# Patient Record
Sex: Female | Born: 1955
Health system: Southern US, Community
[De-identification: ages and names within clinical notes are randomized; demographics above are authoritative.]

## PROBLEM LIST (undated history)

## (undated) DIAGNOSIS — T4145XA Adverse effect of unspecified anesthetic, initial encounter: Secondary | ICD-10-CM

## (undated) DIAGNOSIS — C801 Malignant (primary) neoplasm, unspecified: Secondary | ICD-10-CM

## (undated) DIAGNOSIS — R42 Dizziness and giddiness: Secondary | ICD-10-CM

## (undated) DIAGNOSIS — R519 Headache, unspecified: Secondary | ICD-10-CM

## (undated) DIAGNOSIS — R112 Nausea with vomiting, unspecified: Secondary | ICD-10-CM

## (undated) DIAGNOSIS — L309 Dermatitis, unspecified: Secondary | ICD-10-CM

## (undated) DIAGNOSIS — R51 Headache: Secondary | ICD-10-CM

## (undated) DIAGNOSIS — M549 Dorsalgia, unspecified: Secondary | ICD-10-CM

## (undated) DIAGNOSIS — Z9889 Other specified postprocedural states: Secondary | ICD-10-CM

## (undated) DIAGNOSIS — I10 Essential (primary) hypertension: Secondary | ICD-10-CM

## (undated) DIAGNOSIS — Z9109 Other allergy status, other than to drugs and biological substances: Secondary | ICD-10-CM

## (undated) DIAGNOSIS — T8859XA Other complications of anesthesia, initial encounter: Secondary | ICD-10-CM

## (undated) DIAGNOSIS — G8929 Other chronic pain: Secondary | ICD-10-CM

## (undated) DIAGNOSIS — F419 Anxiety disorder, unspecified: Secondary | ICD-10-CM

## (undated) HISTORY — PX: TUBAL LIGATION: SHX77

## (undated) HISTORY — PX: BREAST SURGERY: SHX581

## (undated) HISTORY — DX: Dermatitis, unspecified: L30.9

## (undated) HISTORY — PX: LIPOSUCTION: SHX10

---

## 2009-07-21 ENCOUNTER — Ambulatory Visit: Payer: Self-pay | Admitting: Diagnostic Radiology

## 2009-07-21 ENCOUNTER — Emergency Department (HOSPITAL_BASED_OUTPATIENT_CLINIC_OR_DEPARTMENT_OTHER): Admission: EM | Admit: 2009-07-21 | Discharge: 2009-07-21 | Payer: Self-pay | Admitting: Emergency Medicine

## 2009-10-25 ENCOUNTER — Emergency Department (HOSPITAL_BASED_OUTPATIENT_CLINIC_OR_DEPARTMENT_OTHER): Admission: EM | Admit: 2009-10-25 | Discharge: 2009-10-25 | Payer: Self-pay | Admitting: Emergency Medicine

## 2010-07-15 LAB — RAPID STREP SCREEN (MED CTR MEBANE ONLY): Streptococcus, Group A Screen (Direct): NEGATIVE

## 2010-07-23 LAB — URINE CULTURE: Colony Count: 15000

## 2010-07-23 LAB — URINALYSIS, ROUTINE W REFLEX MICROSCOPIC
Hgb urine dipstick: NEGATIVE
Ketones, ur: NEGATIVE mg/dL
Protein, ur: NEGATIVE mg/dL
Urobilinogen, UA: 0.2 mg/dL (ref 0.0–1.0)

## 2010-07-23 LAB — URINE MICROSCOPIC-ADD ON

## 2011-01-03 ENCOUNTER — Encounter: Payer: Self-pay | Admitting: *Deleted

## 2011-01-03 ENCOUNTER — Emergency Department (HOSPITAL_BASED_OUTPATIENT_CLINIC_OR_DEPARTMENT_OTHER)
Admission: EM | Admit: 2011-01-03 | Discharge: 2011-01-03 | Disposition: A | Discharge status: Home / Self Care | Payer: Self-pay | Attending: Emergency Medicine | Admitting: Emergency Medicine

## 2011-01-03 DIAGNOSIS — I10 Essential (primary) hypertension: Secondary | ICD-10-CM | POA: Insufficient documentation

## 2011-01-03 DIAGNOSIS — H8110 Benign paroxysmal vertigo, unspecified ear: Secondary | ICD-10-CM | POA: Insufficient documentation

## 2011-01-03 DIAGNOSIS — H9209 Otalgia, unspecified ear: Secondary | ICD-10-CM | POA: Insufficient documentation

## 2011-01-03 HISTORY — DX: Essential (primary) hypertension: I10

## 2011-01-03 MED ORDER — LORAZEPAM 1 MG PO TABS
1.0000 mg | ORAL_TABLET | Freq: Once | ORAL | Status: AC
  Administered 2011-01-03: 1 mg via ORAL
  Filled 2011-01-03: qty 1

## 2011-01-03 MED ORDER — MECLIZINE HCL 25 MG PO TABS
25.0000 mg | ORAL_TABLET | Freq: Once | ORAL | Status: AC
  Administered 2011-01-03: 25 mg via ORAL
  Filled 2011-01-03: qty 1

## 2011-01-03 MED ORDER — MECLIZINE HCL 25 MG PO TABS: 25.0000 mg | ORAL_TABLET | Freq: Four times a day (QID) | ORAL | Status: AC

## 2011-01-03 NOTE — ED Notes (Signed)
Pt denies much improvement in symptoms, but states she would like to be discharged and attempt to sleep off the dizziness. Will notify MD

## 2011-01-03 NOTE — ED Notes (Signed)
C/o left ear pain and dizziness

## 2011-01-03 NOTE — ED Notes (Signed)
Dr. Opitz at bedside. 

## 2011-01-03 NOTE — ED Notes (Signed)
Pt reports left ear pain and dizziness since waking up this morning. Hx of same multiple times, however pt states she doesn't normally "feel like the room is spinning around, and this time it is". Pt took a meclizine tablet at home, but thinks it may be out of date.

## 2011-01-03 NOTE — ED Provider Notes (Signed)
History     CSN: [id number] Arrival date & time: 01/03/2011 12:29 AM  Chief Complaint  Patient presents with  . Otalgia  . Dizziness   Patient is a 55 y.o. female presenting with neurologic complaint.  Neurologic Problem The primary symptoms include dizziness. Primary symptoms do not include headaches or fever. The symptoms began 12 to 24 hours ago. The symptoms are unchanged. Focality: unchnaged. The symptoms occurred after standing up.  Dizziness does not occur with weakness.  Additional symptoms include vertigo. Additional symptoms do not include neck stiffness, weakness, pain, loss of balance, photophobia, nystagmus, hyperacusis or hearing loss. Medical issues do not include cerebral vascular accident or drug use. Workup history includes CT scan. Procedure history comments: has seen ENT, PCP and then about a year ago saw Pilar Jarvis a physical therapist who able to perform manuevers that alleviated these symptoms. Marland Kitchen  started after standing up this am, feels just like her previous vertigo symptoms, she has felt somewhat congested last few days and has not been taking her allegra. No speech or gait difficulty but every time she looks opr moves a certain way, she gets severe dizzieness with nausea.   Past Medical History  Diagnosis Date  . Hypertension     History reviewed. No pertinent past surgical history.  History reviewed. No pertinent family history.  History  Substance Use Topics  . Smoking status: Never Smoker   . Smokeless tobacco: Not on file  . Alcohol Use: Yes    OB History    Grav Para Term Preterm Abortions TAB SAB Ect Mult Living                  Review of Systems  Constitutional: Negative for fever and chills.  HENT: Negative for hearing loss, neck pain and neck stiffness.   Eyes: Negative for photophobia and pain.  Respiratory: Negative for shortness of breath.   Cardiovascular: Negative for chest pain.  Gastrointestinal: Negative for abdominal pain.    Genitourinary: Negative for dysuria.  Musculoskeletal: Negative for back pain.  Skin: Negative for rash.  Neurological: Positive for dizziness and vertigo. Negative for syncope, weakness, numbness, headaches and loss of balance.  All other systems reviewed and are negative.    Physical Exam  BP 140/88  Pulse 84  Temp(Src) 98.1 F (36.7 C) (Oral)  Resp 17  Ht 5\' 6"  (1.676 m)  Wt 211 lb (95.709 kg)  BMI 34.06 kg/m2  SpO2 100%  Physical Exam  Constitutional: She is oriented to person, place, and time. She appears well-developed and well-nourished.  HENT:  Head: Normocephalic and atraumatic.  Eyes: Conjunctivae and EOM are normal. Pupils are equal, round, and reactive to light.  Neck: Full passive range of motion without pain. Neck supple. No thyromegaly present.       No meningismus  Cardiovascular: Normal rate, regular rhythm, S1 normal, S2 normal and intact distal pulses.   Pulmonary/Chest: Effort normal and breath sounds normal.  Abdominal: Soft. Bowel sounds are normal. There is no tenderness. There is no CVA tenderness.  Musculoskeletal: Normal range of motion.  Neurological: She is alert and oriented to person, place, and time. She has normal strength and normal reflexes. No cranial nerve deficit or sensory deficit. She displays a negative Romberg sign. GCS eye subscore is 4. GCS verbal subscore is 5. GCS motor subscore is 6.       Normal Gait, no nystagmus  Skin: Skin is warm and dry. No rash noted. No cyanosis. Nails show no  clubbing.  Psychiatric: She has a normal mood and affect. Her speech is normal and behavior is normal.    ED Course  Procedures  MDM Vertigo with h/o similar symptoms in the past, treated by [facility]. Normal neuro exam. No indication for CT or MRI. Treated with ativan and antivert. Recheck at 0221am- exam unchanged, feeling better and wants to go home, requesting a referral to her PT Pilar Jarvis at [facility] who was able to help her in the past.  reliable historian understands strict return precaution and follow up instructions.       Sunnie Nielsen, MD 01/03/11 [phone]

## 2014-05-17 ENCOUNTER — Encounter: Payer: Self-pay | Admitting: Neurology

## 2014-05-17 ENCOUNTER — Ambulatory Visit (INDEPENDENT_AMBULATORY_CARE_PROVIDER_SITE_OTHER): Payer: Medicare HMO | Admitting: Neurology

## 2014-05-17 VITALS — BP 138/80 | HR 76 | Resp 16 | Wt 244.4 lb

## 2014-05-17 DIAGNOSIS — M545 Low back pain, unspecified: Secondary | ICD-10-CM

## 2014-05-17 DIAGNOSIS — M542 Cervicalgia: Secondary | ICD-10-CM | POA: Diagnosis not present

## 2014-05-17 DIAGNOSIS — G43009 Migraine without aura, not intractable, without status migrainosus: Secondary | ICD-10-CM

## 2014-05-17 DIAGNOSIS — G43909 Migraine, unspecified, not intractable, without status migrainosus: Secondary | ICD-10-CM | POA: Insufficient documentation

## 2014-05-17 DIAGNOSIS — M19012 Primary osteoarthritis, left shoulder: Secondary | ICD-10-CM | POA: Diagnosis not present

## 2014-05-17 DIAGNOSIS — G8929 Other chronic pain: Secondary | ICD-10-CM | POA: Insufficient documentation

## 2014-05-17 MED ORDER — TIZANIDINE HCL 4 MG PO TABS: ORAL_TABLET | ORAL | Status: DC

## 2014-05-17 MED ORDER — OXYCODONE-ACETAMINOPHEN 10-325 MG PO TABS: 1.0000 | ORAL_TABLET | Freq: Two times a day (BID) | ORAL | Status: DC | PRN

## 2014-05-17 MED ORDER — IMIPRAMINE HCL 25 MG PO TABS: ORAL_TABLET | ORAL | Status: DC

## 2014-05-17 NOTE — Progress Notes (Signed)
t  [facility]  PATIENT: Kimberly Mann DOB: [birth year]  REFERRING CLINICIAN: [name]  HISTORY FROM: Patient  REASON FOR VISIT: Left shoulder and neck pain; back pain   HISTORICAL  CHIEF COMPLAINT:  Chief Complaint  Patient presents with  . Back Pain  . Shoulder Pain    Sts. lower back pain, left sided neck and left shoulder pain are worse over the last several days, she thinks due to colder weather./fim  . Neck Pain    HISTORY OF PRESENT ILLNESS:  Kimberly Mann is a 59 yo woman with neck pain, shoulder pain and back pain.  She has had neck pain for a while but over the last few months it has been more intense. It is worse on the left side.  She gets pain that radiates into the left arm with some tingling into the fingers at times. Moving around will sometimes increase the symptoms.   The left arm seems a little weak.   There is no fixed numbness.    The worse pain is in the left neck.    In the past, trigger point injections did not really help her pain much and she had a yeast infection the one time.   Flexeril only helped a little bit but tizanidine has helped more.    She is taking at night only.  The shoulder pain is sometimes distinct from the neck pain and increases with ext rotation of the arm.    Injections had not helped in the past.     She also reports  Lower back pain worse on her left.     Pain is better if she shifts around and worse if she sits still for a long time.      Pain does not radiate into her legs.   It is crampy.    Muscle relaxants have helped her pain a little bit.   Tramadol had not helped.       She has chronic headaches that sometimes starts at the neck.   She has nausea when the pain is more intense.  She has photo and phonophobia.    Moving increases the throbbing pain.   Resting in a quiet dark room helps the pain.  Topiramate used to help her but no longer does so she stopped.  Sleep is poor some nights.     REVIEW OF  SYSTEMS:  Constitutional: No fevers, chills, sweats, or change in appetite Eyes: No visual changes, double vision, eye pain Ear, nose and throat: No hearing loss, ear pain, nasal congestion, sore throat Cardiovascular: No chest pain, palpitations Respiratory:  No shortness of breath at rest or with exertion.   No wheezes GastrointestinaI: No nausea, vomiting, diarrhea, abdominal pain, fecal incontinence Genitourinary:  No dysuria, urinary retention or frequency.  No nocturia. Musculoskeletal:  No neck pain, back pain Integumentary: No rash, pruritus, skin lesions Neurological: as above Psychiatric: No depression at this time.  No anxiety Endocrine: No palpitations, diaphoresis, change in appetite, change in weigh or increased thirst Hematologic/Lymphatic:  No anemia, purpura, petechiae. Allergic/Immunologic: No itchy/runny eyes, nasal congestion, recent allergic reactions, rashes  ALLERGIES: Allergies  Allergen Reactions  . Penicillins   . Sulfa Antibiotics     HOME MEDICATIONS: Outpatient Prescriptions Prior to Visit  Medication Sig Dispense Refill  . amLODipine (NORVASC) 5 MG tablet Take 5 mg by mouth daily.      . hydrochlorothiazide 25 MG tablet Take 25 mg by mouth daily.  No facility-administered medications prior to visit.    PAST MEDICAL HISTORY: Past Medical History  Diagnosis Date  . Hypertension     PAST SURGICAL HISTORY: Past Surgical History  Procedure Laterality Date  . Tubal ligation    . Liposuction      FAMILY HISTORY: Family History  Problem Relation Age of Onset  . Hypertension Mother     SOCIAL HISTORY:  History   Social History  . Marital Status: Legally Separated    Spouse Name: N/A    Number of Children: N/A  . Years of Education: N/A   Occupational History  . Not on file.   Social History Main Topics  . Smoking status: Former Smoker    Quit date: 05/17/1974  . Smokeless tobacco: Not on file  . Alcohol Use: 4.2 oz/week     7 Not specified per week     Comment: one glass of wine with dinner/fim  . Drug Use: No  . Sexual Activity: Not on file   Other Topics Concern  . Not on file   Social History Narrative     PHYSICAL EXAM  Filed Vitals:   05/17/14 1545  BP: 138/80  Pulse: 76  Resp: 16  Weight: 244 lb 6.4 oz (110.859 kg)    Body mass index is 39.47 kg/(m^2).   General: The patient is well-developed and well-nourished and in no acute distress  Eyes:  Funduscopic exam shows normal optic discs and retinal vessels.  Neck: The neck is supple, no carotid bruits are noted.  The neck is tender over the lower left paraspinal muscles and trapezius muscles  Musculoskeletal:   The left shoulder is tendeer over the subacromial bursa.   The back is tender over lower lumbar paraspinal muscles.  Respiratory: The respiratory examination is clear.  Cardiovascular: The cardiovascular examination reveals a regular rate and rhythm, no murmurs, gallops or rubs are noted.  Skin: Extremities are without significant edema.  Neurologic Exam  Mental status: The patient is alert and oriented x 3 at the time of the examination. The patient has apparent normal recent and remote memory, with an apparently normal attention span and concentration ability.   Speech is normal.  Cranial nerves: Extraocular movements are full. Pupils are equal, round, and reactive to light and accomodation.  Visual fields are full.  Facial symmetry is present. There is good facial sensation to soft touch bilaterally.Facial strength is normal.  Trapezius and sternocleidomastoid strength is normal. No dysarthria is noted.  The tongue is midline, and the patient has symmetric elevation of the soft palate. No obvious hearing deficits are noted.  Motor:  Muscle bulk and tone are normal. Strength is  5 / 5 in all 4 extremities.   Sensory: Sensory testing is intact to pinprick, soft touch, vibration sensation, and position sense on all 4  extremities.  Coordination: Cerebellar testing reveals good finger-nose-finger and heel-to-shin bilaterally.  Gait and station: Station and gait are normal. Tandem gait is normal. Romberg is negative.   Reflexes: Deep tendon reflexes are symmetric and normal bilaterally. Plantar responses are normal.    DIAGNOSTIC DATA (LABS, IMAGING, TESTING) - I reviewed patient records, labs, notes, testing and imaging myself where available.     ASSESSMENT AND PLAN  Primary osteoarthritis of left shoulder  Neck pain  Midline low back pain without sciatica  Migraine without aura and without status migrainosus, not intractable   In summary, Mrs. Signer is a 59 year old woman with neck pain, back pain, left shoulder pain  and migraine headaches. She has had yeast infections as a complication of trigger point and joint injections in the past and wishes not to have another injection. I will renew her oxycodone. I will also start imipramine 25-50 mg nightly. She is advised to stay active and exercises regularly.  She will return to see me in about 4 months, sooner if she has new or worsening neurologic symptoms.  Richard A. Felecia Shelling, MD, PhD 1/49/7026, 3:78 PM Certified in Neurology, Clinical Neurophysiology, Sleep Medicine, Pain Medicine and Neuroimaging  [facility] [address], [place], [place] [address] [phone]

## 2014-05-23 ENCOUNTER — Other Ambulatory Visit: Payer: Self-pay | Admitting: *Deleted

## 2014-05-23 ENCOUNTER — Telehealth: Payer: Self-pay | Admitting: Neurology

## 2014-05-23 ENCOUNTER — Other Ambulatory Visit: Payer: Self-pay | Admitting: Neurology

## 2014-05-23 DIAGNOSIS — M545 Low back pain, unspecified: Secondary | ICD-10-CM

## 2014-05-23 DIAGNOSIS — M549 Dorsalgia, unspecified: Secondary | ICD-10-CM

## 2014-05-23 MED ORDER — OXYCODONE-ACETAMINOPHEN 10-325 MG PO TABS: 1.0000 | ORAL_TABLET | Freq: Two times a day (BID) | ORAL | Status: DC | PRN

## 2014-05-23 MED ORDER — OXYCODONE-ACETAMINOPHEN 10-325 MG PO TABS: 1.0000 | ORAL_TABLET | Freq: Four times a day (QID) | ORAL | Status: DC | PRN

## 2014-05-23 NOTE — Telephone Encounter (Signed)
Pt wants to know when she can come and pick up the correct Rx for oxyCODONE-acetaminophen (PERCOCET) 10-325 MG per tablet.  The one she got was for 1/19 and it should be for 2/1.  She will bring the wrong one back when she picks up the correct one.  Please call and advise.

## 2014-05-23 NOTE — Telephone Encounter (Signed)
Spoke with Kimberly Mann and advised post-dated rx. Is available to be picked up/fim

## 2014-07-06 ENCOUNTER — Other Ambulatory Visit: Payer: Self-pay | Admitting: *Deleted

## 2014-07-06 DIAGNOSIS — M545 Low back pain, unspecified: Secondary | ICD-10-CM

## 2014-07-06 MED ORDER — OXYCODONE-ACETAMINOPHEN 10-325 MG PO TABS: 1.0000 | ORAL_TABLET | Freq: Four times a day (QID) | ORAL | Status: DC | PRN

## 2014-07-06 NOTE — Telephone Encounter (Signed)
Request entered, forwarded to provider for approval.  

## 2014-07-07 ENCOUNTER — Telehealth: Payer: Self-pay

## 2014-07-07 NOTE — Telephone Encounter (Signed)
Called patient and informed Rx ready for pick up at front desk. Patient verbalized understanding.  

## 2014-08-03 ENCOUNTER — Telehealth: Payer: Self-pay | Admitting: Neurology

## 2014-08-03 DIAGNOSIS — M545 Low back pain, unspecified: Secondary | ICD-10-CM

## 2014-08-03 MED ORDER — OXYCODONE-ACETAMINOPHEN 10-325 MG PO TABS: 1.0000 | ORAL_TABLET | Freq: Four times a day (QID) | ORAL | Status: DC | PRN

## 2014-08-03 NOTE — Telephone Encounter (Signed)
Spoke with Kimberly Mann and advised rx. will be ready after 1pm today.  Rx. printed, signed, up front [facility]/fim

## 2014-08-03 NOTE — Telephone Encounter (Signed)
Patient requesting refill for Rx oxyCODONE-acetaminophen (PERCOCET) 10-325 MG per tablet.  Please call when ready for pick up.

## 2014-08-30 ENCOUNTER — Telehealth: Payer: Self-pay | Admitting: Neurology

## 2014-08-30 DIAGNOSIS — M545 Low back pain, unspecified: Secondary | ICD-10-CM

## 2014-08-30 MED ORDER — OXYCODONE-ACETAMINOPHEN 10-325 MG PO TABS: 1.0000 | ORAL_TABLET | Freq: Four times a day (QID) | ORAL | Status: DC | PRN

## 2014-08-30 NOTE — Telephone Encounter (Signed)
Patient called and needed a refill for Rx. oxyCODONE-acetaminophen (PERCOCET) 10-325 MG per tablet. Would like to know if she can pick it up tomorrow. Please call and advise.

## 2014-08-30 NOTE — Telephone Encounter (Signed)
I have spoken with Kimberly Mann and advised she can pick post-dated rx (due 5-8) up tomorrow.  Rx. printed, signed, up front [facility]/fim

## 2014-09-29 ENCOUNTER — Telehealth: Payer: Self-pay | Admitting: Neurology

## 2014-09-29 DIAGNOSIS — M545 Low back pain, unspecified: Secondary | ICD-10-CM

## 2014-09-29 MED ORDER — OXYCODONE-ACETAMINOPHEN 10-325 MG PO TABS: 1.0000 | ORAL_TABLET | Freq: Four times a day (QID) | ORAL | Status: DC | PRN

## 2014-09-29 NOTE — Telephone Encounter (Signed)
Patient called and requested a refill on Rx. oxyCODONE-acetaminophen (PERCOCET) 10-325 MG per tablet. Informed the patient it would be ready within 24 hours.

## 2014-09-29 NOTE — Telephone Encounter (Signed)
Rx. printed, awaiting signature/fim

## 2014-09-29 NOTE — Telephone Encounter (Signed)
Rx. signed and up front [facility]/fim

## 2014-11-02 ENCOUNTER — Other Ambulatory Visit: Payer: Self-pay | Admitting: Neurology

## 2014-11-02 DIAGNOSIS — M545 Low back pain, unspecified: Secondary | ICD-10-CM

## 2014-11-02 MED ORDER — OXYCODONE-ACETAMINOPHEN 10-325 MG PO TABS: 1.0000 | ORAL_TABLET | Freq: Four times a day (QID) | ORAL | Status: DC | PRN

## 2014-11-02 NOTE — Telephone Encounter (Signed)
Patient is calling to order written Rx oxycodone 10-325 mg.  Thanks!

## 2014-11-02 NOTE — Telephone Encounter (Signed)
Request entered, forwarded to provider for approval.  

## 2014-11-30 ENCOUNTER — Other Ambulatory Visit: Payer: Self-pay | Admitting: Neurology

## 2014-11-30 DIAGNOSIS — M545 Low back pain, unspecified: Secondary | ICD-10-CM

## 2014-11-30 MED ORDER — OXYCODONE-ACETAMINOPHEN 10-325 MG PO TABS: 1.0000 | ORAL_TABLET | Freq: Four times a day (QID) | ORAL | Status: DC | PRN

## 2014-11-30 NOTE — Telephone Encounter (Signed)
Request forwarded to provider for review.

## 2014-11-30 NOTE — Telephone Encounter (Signed)
Pt is requesting refill oxyCODONE-acetaminophen (PERCOCET) 10-325 MG per tablet thinks it may be endocet

## 2014-12-01 ENCOUNTER — Telehealth: Payer: Self-pay | Admitting: *Deleted

## 2014-12-01 NOTE — Telephone Encounter (Signed)
Spoke w/ pt and told her oxycodone-acetaminophen Rx refill ready to pick up in office. Her son called yesterday and scheduled f/u for 12/13/14 at 1:00pm. Rx placed up front. She verbalized understanding.

## 2014-12-13 ENCOUNTER — Ambulatory Visit (INDEPENDENT_AMBULATORY_CARE_PROVIDER_SITE_OTHER): Payer: Medicare HMO | Admitting: Neurology

## 2014-12-13 ENCOUNTER — Encounter: Payer: Self-pay | Admitting: Neurology

## 2014-12-13 VITALS — BP 152/90 | HR 82 | Resp 18 | Ht 66.0 in | Wt 244.6 lb

## 2014-12-13 DIAGNOSIS — M5489 Other dorsalgia: Secondary | ICD-10-CM | POA: Diagnosis not present

## 2014-12-13 DIAGNOSIS — M7550 Bursitis of unspecified shoulder: Secondary | ICD-10-CM | POA: Insufficient documentation

## 2014-12-13 DIAGNOSIS — G43009 Migraine without aura, not intractable, without status migrainosus: Secondary | ICD-10-CM | POA: Diagnosis not present

## 2014-12-13 DIAGNOSIS — M545 Low back pain, unspecified: Secondary | ICD-10-CM

## 2014-12-13 DIAGNOSIS — M7552 Bursitis of left shoulder: Secondary | ICD-10-CM | POA: Diagnosis not present

## 2014-12-13 DIAGNOSIS — M542 Cervicalgia: Secondary | ICD-10-CM

## 2014-12-13 MED ORDER — OXYCODONE-ACETAMINOPHEN 10-325 MG PO TABS: 1.0000 | ORAL_TABLET | Freq: Four times a day (QID) | ORAL | Status: DC | PRN

## 2014-12-13 NOTE — Progress Notes (Signed)
t  [facility]  PATIENT: Kimberly Mann DOB: 12/27/1955  REFERRING CLINICIAN: [name]  HISTORY FROM: Patient  REASON FOR VISIT: Left shoulder and neck pain; back pain   HISTORICAL  CHIEF COMPLAINT:  Chief Complaint  Patient presents with  . Neck Pain    Sts. neck and lbp are about the same.  Sts. left shoulder pain is worse./fim  . Left Shoulder Pain  . Back Pain    HISTORY OF PRESENT ILLNESS:  Kimberly Mann is a 59 yo woman with shoulder pain, neck pain and back pain.  The left shoulder pain is a lot worse.  Pain is worse with elevation and ext rotation of the arm.    Injections helped some in the past.   Right shoulder hurts some but not nearly as much as the left.  She reports L > R neck pain with a little radiation into the left arm with some tingling into the fingers at times. Moving around will sometimes increase the symptoms.   The left arm seems a little weak.   There is no fixed numbness.   In the past, trigger point injections help her some but benefit was short lived.   Tizanidine has helped a little bit.    She also reports  Left > right lower back pain.   There is some buttock pain  Pain does not radiate into her legs.   It is crampy.    Muscle relaxants have helped her pain a little bit.   Oxycodone has helped       She has chronic headaches that are worse when her neck pain acts up.  She has nausea when the pain is more intense.  She has photo and phonophobia.    Moving increases the throbbing pain.   Resting in a quiet dark room helps the pain.    Acupuncture has helped (last did last week).    Imipramine is helping more than topiramate.      Sleep is poor some nights.  She has more trouble staying asleep and feels she is a little better with imipramine.   REVIEW OF SYSTEMS:  Constitutional: No fevers, chills, sweats, or change in appetite Eyes: No visual changes, double vision, eye pain Ear, nose and throat: No hearing loss, ear pain,  nasal congestion, sore throat Cardiovascular: No chest pain, palpitations Respiratory:  No shortness of breath at rest or with exertion.   No wheezes GastrointestinaI: No nausea, vomiting, diarrhea, abdominal pain, fecal incontinence Genitourinary:  No dysuria, urinary retention or frequency.  No nocturia. Musculoskeletal:  see aboveIntegumentary: No rash, pruritus, skin lesions Neurological: as above Psychiatric: No depression at this time.  No anxiety Endocrine: No palpitations, diaphoresis, change in appetite, change in weigh or increased thirst  ALLERGIES: Allergies  Allergen Reactions  . Penicillins   . Sulfa Antibiotics     HOME MEDICATIONS: Outpatient Prescriptions Prior to Visit  Medication Sig Dispense Refill  . amLODipine (NORVASC) 5 MG tablet Take 5 mg by mouth daily.      . hydrochlorothiazide 25 MG tablet Take 25 mg by mouth daily.      Marland Kitchen imipramine (TOFRANIL) 25 MG tablet Take one or two pills at bedtime 60 tablet 11  . oxyCODONE-acetaminophen (PERCOCET) 10-325 MG per tablet Take 1 tablet by mouth every 6 (six) hours as needed for pain. 120 tablet 0  . tiZANidine (ZANAFLEX) 4 MG tablet One pill in am, one pill po mid-day and one or two po at bedtime 120  tablet 11   No facility-administered medications prior to visit.    PAST MEDICAL HISTORY: Past Medical History  Diagnosis Date  . Hypertension     PAST SURGICAL HISTORY: Past Surgical History  Procedure Laterality Date  . Tubal ligation    . Liposuction      FAMILY HISTORY: Family History  Problem Relation Age of Onset  . Hypertension Mother     SOCIAL HISTORY:  Social History   Social History  . Marital Status: Legally Separated    Spouse Name: N/A  . Number of Children: N/A  . Years of Education: N/A   Occupational History  . Not on file.   Social History Main Topics  . Smoking status: Former Smoker    Quit date: 05/17/1974  . Smokeless tobacco: Not on file  . Alcohol Use: 4.2 oz/week     7 Standard drinks or equivalent per week     Comment: one glass of wine with dinner/fim  . Drug Use: No  . Sexual Activity: Not on file   Other Topics Concern  . Not on file   Social History Narrative     PHYSICAL EXAM  Filed Vitals:   12/13/14 1308  BP: 152/90  Pulse: 82  Resp: 18  Height: 5\' 6"  (1.676 m)  Weight: 244 lb 9.6 oz (110.95 kg)    Body mass index is 39.5 kg/(m^2).   General: The patient is well-developed and well-nourished and in no acute distress  Neck:  The neck is mildly tender over the lower left paraspinal muscles and trapezius muscles  Musculoskeletal:   The left shoulder is very  tender over the subacromial bursa.  ROM is reduced in left shoulder. The back is tender over lower lumbar paraspinal muscles.   Neurologic Exam  Mental status: The patient is alert and oriented x 3 at the time of the examination. The patient has apparent normal recent and remote memory, with an apparently normal attention span and concentration ability.   Speech is normal.  Cranial nerves: Extraocular movements are full.   There is good facial sensation to soft touch bilaterally.Facial strength is normal.  Trapezius and sternocleidomastoid strength is normal. No dysarthria is noted.    No obvious hearing deficits are noted.  Motor:  Muscle bulk and tone are normal. Strength is  5 / 5 in all 4 extremities.   Sensory: Sensory testing is intact to soft touch, vibration sensationin all 4 extremities.  Coordination: Cerebellar testing reveals good finger-nose-finger bilaterally.  Gait and station: Station and gait are normal. Tandem gait is normal.   Reflexes: Deep tendon reflexes are symmetric and normal bilaterally.      DIAGNOSTIC DATA (LABS, IMAGING, TESTING) - I reviewed patient records, labs, notes, testing and imaging myself where available.     ASSESSMENT AND PLAN  1.   Left subacromial bursa injection with 40 mg Depo-Medrol in Marc[place] 2.   Refill percocet.      She will return to see me in about 4 months, sooner if she has new or worsening neurologic symptoms.  Reynald Woods A. Felecia Shelling, MD, PhD 05/29/8655, 8:46 PM Certified in Neurology, Clinical Neurophysiology, Sleep Medicine, Pain Medicine and Neuroimaging  [facility] [address], [place], [place] [address] [phone]

## 2015-01-25 ENCOUNTER — Telehealth: Payer: Self-pay | Admitting: Neurology

## 2015-01-25 DIAGNOSIS — M545 Low back pain, unspecified: Secondary | ICD-10-CM

## 2015-01-25 MED ORDER — OXYCODONE-ACETAMINOPHEN 10-325 MG PO TABS: 1.0000 | ORAL_TABLET | Freq: Four times a day (QID) | ORAL | Status: DC | PRN

## 2015-01-25 NOTE — Telephone Encounter (Signed)
Rx. printed, on RAS ledge to be signed/fim

## 2015-01-25 NOTE — Telephone Encounter (Signed)
Pt needs refill on oxyCODONE-acetaminophen (PERCOCET) 10-325 MG per tablet, thank you

## 2015-01-25 NOTE — Telephone Encounter (Signed)
Rx. up front [facility]/fim 

## 2015-02-27 ENCOUNTER — Other Ambulatory Visit: Payer: Self-pay | Admitting: Neurology

## 2015-02-27 ENCOUNTER — Encounter: Payer: Self-pay | Admitting: *Deleted

## 2015-02-27 DIAGNOSIS — M545 Low back pain, unspecified: Secondary | ICD-10-CM

## 2015-02-27 MED ORDER — OXYCODONE-ACETAMINOPHEN 10-325 MG PO TABS: 1.0000 | ORAL_TABLET | Freq: Four times a day (QID) | ORAL | Status: DC | PRN

## 2015-02-27 NOTE — Progress Notes (Signed)
Oxycodone rx. up front [facility]/fim 

## 2015-02-27 NOTE — Telephone Encounter (Signed)
Request entered, forwarded to provider for approval.  

## 2015-02-27 NOTE — Telephone Encounter (Signed)
Son Kimberly Mann called to request refill of oxyCODONE-acetaminophen (PERCOCET) 10-325 MG tablet

## 2015-03-29 ENCOUNTER — Encounter: Payer: Self-pay | Admitting: *Deleted

## 2015-03-29 ENCOUNTER — Other Ambulatory Visit: Payer: Self-pay | Admitting: Neurology

## 2015-03-29 DIAGNOSIS — M545 Low back pain, unspecified: Secondary | ICD-10-CM

## 2015-03-29 MED ORDER — OXYCODONE-ACETAMINOPHEN 10-325 MG PO TABS: 1.0000 | ORAL_TABLET | Freq: Four times a day (QID) | ORAL | Status: DC | PRN

## 2015-03-29 NOTE — Telephone Encounter (Signed)
Patient called to request refill of oxyCODONE-acetaminophen (PERCOCET) 10-325 MG tablet

## 2015-03-29 NOTE — Telephone Encounter (Signed)
Request entered, forwarded to provider for review.  

## 2015-03-29 NOTE — Progress Notes (Signed)
Oxycodone rx. up front [facility]/fim 

## 2015-05-02 ENCOUNTER — Other Ambulatory Visit: Payer: Self-pay | Admitting: Neurology

## 2015-05-02 ENCOUNTER — Encounter: Payer: Self-pay | Admitting: *Deleted

## 2015-05-02 DIAGNOSIS — M545 Low back pain, unspecified: Secondary | ICD-10-CM

## 2015-05-02 MED ORDER — OXYCODONE-ACETAMINOPHEN 10-325 MG PO TABS: 1.0000 | ORAL_TABLET | Freq: Four times a day (QID) | ORAL | Status: DC | PRN

## 2015-05-02 NOTE — Progress Notes (Signed)
Oxycodone rx. up front [facility]/fim 

## 2015-05-02 NOTE — Telephone Encounter (Signed)
Request entered, forwarded to provider for review.  

## 2015-05-02 NOTE — Telephone Encounter (Signed)
Patient is calling to order a written Rx Oxycodone-acetaminophen 10-325 mg.  Thanks!

## 2015-05-17 ENCOUNTER — Encounter: Payer: Self-pay | Admitting: Neurology

## 2015-05-17 ENCOUNTER — Ambulatory Visit (INDEPENDENT_AMBULATORY_CARE_PROVIDER_SITE_OTHER): Payer: Medicare HMO | Admitting: Neurology

## 2015-05-17 VITALS — BP 138/86 | HR 72 | Resp 16 | Ht 66.0 in | Wt 231.6 lb

## 2015-05-17 DIAGNOSIS — M5489 Other dorsalgia: Secondary | ICD-10-CM

## 2015-05-17 DIAGNOSIS — M5432 Sciatica, left side: Secondary | ICD-10-CM

## 2015-05-17 DIAGNOSIS — M7552 Bursitis of left shoulder: Secondary | ICD-10-CM

## 2015-05-17 DIAGNOSIS — M545 Low back pain, unspecified: Secondary | ICD-10-CM

## 2015-05-17 DIAGNOSIS — M542 Cervicalgia: Secondary | ICD-10-CM | POA: Diagnosis not present

## 2015-05-17 DIAGNOSIS — M5431 Sciatica, right side: Secondary | ICD-10-CM

## 2015-05-17 DIAGNOSIS — M7062 Trochanteric bursitis, left hip: Secondary | ICD-10-CM

## 2015-05-17 MED ORDER — DICLOFENAC SODIUM 1 % TD GEL: 2.0000 g | Freq: Four times a day (QID) | TRANSDERMAL | Status: DC

## 2015-05-17 MED ORDER — OXYCODONE-ACETAMINOPHEN 10-325 MG PO TABS: 1.0000 | ORAL_TABLET | Freq: Four times a day (QID) | ORAL | Status: DC | PRN

## 2015-05-17 MED ORDER — HYDROXYZINE PAMOATE 25 MG PO CAPS: 25.0000 mg | ORAL_CAPSULE | Freq: Three times a day (TID) | ORAL | Status: DC | PRN

## 2015-05-17 NOTE — Progress Notes (Signed)
t  [facility]  PATIENT: Kimberly Mann DOB: 19-Mar-1956  REFERRING CLINICIAN: [name]  HISTORY FROM: Patient  REASON FOR VISIT: Left shoulder and neck pain; back pain   HISTORICAL  CHIEF COMPLAINT:  Chief Complaint  Patient presents with  . Back Pain    Sts. lbp has been worse over the last couple of mos.  Occasionally radiates into her left buttock./fim  . Neck Pain    **She requests rx. for Vistaril for itching--sts. she  has itching for about 24 hrs. after tpi's.  She also requests rx. for Voltaren gel/fim    HISTORY OF PRESENT ILLNESS:  Kimberly Mann is a 60 yo woman with lower back/buttock pain, shoulder pain and neck pain.  She reports left > right lower back pain that is most pronounced in the left buttock and hip.  Pain does not radiate into her legs.   Pain is crampy.    Muscle relaxants have helped her pain a little bit.   Oxycodone has helped      The left shoulder pain is acting back up (left worse than right) but is not as bad as it was before the subacromial bursa injection.  ROM is better since the injection.  She reports L > R neck pain that is worse with movements.  No numbness or weakness.Marland Kitchen      REVIEW OF SYSTEMS:  Constitutional: No fevers, chills, sweats, or change in appetite Eyes: No visual changes, double vision, eye pain Ear, nose and throat: No hearing loss, ear pain, nasal congestion, sore throat Cardiovascular: No chest pain, palpitations Respiratory:  No shortness of breath at rest or with exertion.   No wheezes GastrointestinaI: No nausea, vomiting, diarrhea, abdominal pain, fecal incontinence Genitourinary:  No dysuria, urinary retention or frequency.  No nocturia. Musculoskeletal:  see aboveIntegumentary: No rash, pruritus, skin lesions Neurological: as above Psychiatric: No depression at this time.  No anxiety Endocrine: No palpitations, diaphoresis, change in appetite, change in weigh or increased  thirst  ALLERGIES: Allergies  Allergen Reactions  . Penicillins   . Sulfa Antibiotics     HOME MEDICATIONS: Outpatient Prescriptions Prior to Visit  Medication Sig Dispense Refill  . amLODipine (NORVASC) 5 MG tablet Take 5 mg by mouth daily.      . hydrochlorothiazide 25 MG tablet Take 25 mg by mouth daily.      Marland Kitchen imipramine (TOFRANIL) 25 MG tablet Take one or two pills at bedtime 60 tablet 11  . oxyCODONE-acetaminophen (PERCOCET) 10-325 MG tablet Take 1 tablet by mouth every 6 (six) hours as needed for pain. 120 tablet 0  . tiZANidine (ZANAFLEX) 4 MG tablet One pill in am, one pill po mid-day and one or two po at bedtime (Patient not taking: Reported on 05/17/2015) 120 tablet 11  . TYPHIM VI 25 MCG/0.5ML injection Reported on 05/17/2015  0   No facility-administered medications prior to visit.    PAST MEDICAL HISTORY: Past Medical History  Diagnosis Date  . Hypertension     PAST SURGICAL HISTORY: Past Surgical History  Procedure Laterality Date  . Tubal ligation    . Liposuction      FAMILY HISTORY: Family History  Problem Relation Age of Onset  . Hypertension Mother     SOCIAL HISTORY:  Social History   Social History  . Marital Status: Legally Separated    Spouse Name: N/A  . Number of Children: N/A  . Years of Education: N/A   Occupational History  . Not on file.  Social History Main Topics  . Smoking status: Former Smoker    Quit date: 05/17/1974  . Smokeless tobacco: Not on file  . Alcohol Use: 4.2 oz/week    7 Standard drinks or equivalent per week     Comment: one glass of wine with dinner/fim  . Drug Use: No  . Sexual Activity: Not on file   Other Topics Concern  . Not on file   Social History Narrative     PHYSICAL EXAM  Filed Vitals:   05/17/15 1529  BP: 138/86  Pulse: 72  Resp: 16  Height: 5\' 6"  (1.676 m)  Weight: 231 lb 9.6 oz (105.053 kg)    Body mass index is 37.4 kg/(m^2).   General: The patient is well-developed and  well-nourished and in no acute distress  Neck:  The neck is mildly tender over the lower left paraspinal muscles and trapezius muscles  Musculoskeletal:   The left shoulder is mildly tender over the subacromial bursa.  ROM is reduced in left shoulder. The back is tender over the left piriformis muscles.   Tender over left >> right trochanteric bursa.       Neurologic Exam  Mental status: The patient is alert and oriented x 3 at the time of the examination. The patient has apparent normal recent and remote memory, with an apparently normal attention span and concentration ability.    Cranial nerves:    Trapezius and sternocleidomastoid strength is normal. No dysarthria is noted.    No obvious hearing deficits are noted.  Motor:  Muscle bulk and tone are normal. Strength is  5 / 5 in all 4 extremities.   Sensory: Sensory testing is intact to soft touch sensationin all 4 extremities.  Gait and station: Station and gait are normal. Tandem gait is normal.      DIAGNOSTIC DATA (LABS, IMAGING, TESTING) - I reviewed patient records, labs, notes, testing and imaging myself where available.     ASSESSMENT AND PLAN  1.   Left trochanteric bursa injection with 40 mg Depo-Medrol in Marcaine 2.    Bilateral piriformis muscle injections with 4 mg DepoMedrol in Marcaine 3.   Voltaren gel, Refill percocet.     She will return to see me in about 4 months, sooner if she has new or worsening neurologic symptoms.  Arsalan Brisbin A. Felecia Shelling, MD, PhD [id number], [id number] PM Certified in Neurology, Clinical Neurophysiology, Sleep Medicine, Pain Medicine and Neuroimaging  [facility] [address], [place], [place] [address] [phone]

## 2015-06-29 ENCOUNTER — Telehealth: Payer: Self-pay | Admitting: Neurology

## 2015-06-29 DIAGNOSIS — M545 Low back pain, unspecified: Secondary | ICD-10-CM

## 2015-06-29 MED ORDER — OXYCODONE-ACETAMINOPHEN 10-325 MG PO TABS: 1.0000 | ORAL_TABLET | Freq: Four times a day (QID) | ORAL | Status: DC | PRN

## 2015-06-29 NOTE — Telephone Encounter (Signed)
Pt called requesting refill for oxyCODONE-acetaminophen (PERCOCET) 10-325 MG tablet . Pt is going out of town tomorrow, leaving at [place] and inquiring if she could pick RX up today.

## 2015-06-29 NOTE — Telephone Encounter (Signed)
Rx. up front [facility]/fim 

## 2015-07-27 ENCOUNTER — Telehealth: Payer: Self-pay | Admitting: Neurology

## 2015-07-27 DIAGNOSIS — M545 Low back pain, unspecified: Secondary | ICD-10-CM

## 2015-07-27 MED ORDER — OXYCODONE-ACETAMINOPHEN 10-325 MG PO TABS: 1.0000 | ORAL_TABLET | Freq: Four times a day (QID) | ORAL | Status: DC | PRN

## 2015-07-27 NOTE — Telephone Encounter (Signed)
Patient called to request refill of oxyCODONE-acetaminophen (PERCOCET) 10-325 MG tablet

## 2015-07-27 NOTE — Telephone Encounter (Signed)
Rx. awaiting RAS sig/fim 

## 2015-07-27 NOTE — Telephone Encounter (Signed)
Rx. up front [facility]/fim 

## 2015-08-28 ENCOUNTER — Telehealth: Payer: Self-pay | Admitting: Neurology

## 2015-08-28 DIAGNOSIS — M545 Low back pain, unspecified: Secondary | ICD-10-CM

## 2015-08-28 MED ORDER — OXYCODONE-ACETAMINOPHEN 10-325 MG PO TABS: 1.0000 | ORAL_TABLET | Freq: Four times a day (QID) | ORAL | Status: DC | PRN

## 2015-08-28 NOTE — Telephone Encounter (Signed)
Rx. awaiting RAS sig/fim 

## 2015-08-28 NOTE — Telephone Encounter (Signed)
Oxycodone rx. up front [facility]/fim 

## 2015-08-28 NOTE — Telephone Encounter (Signed)
Patient is calling to order a written Rx oxycodone-acetaminophen 10-325 mg tablets.  Thanks!

## 2015-09-13 ENCOUNTER — Ambulatory Visit (INDEPENDENT_AMBULATORY_CARE_PROVIDER_SITE_OTHER): Payer: Medicare HMO | Admitting: Neurology

## 2015-09-13 ENCOUNTER — Encounter: Payer: Self-pay | Admitting: Neurology

## 2015-09-13 VITALS — BP 132/88 | HR 74 | Resp 18 | Ht 66.0 in | Wt 235.0 lb

## 2015-09-13 DIAGNOSIS — M25511 Pain in right shoulder: Secondary | ICD-10-CM | POA: Insufficient documentation

## 2015-09-13 DIAGNOSIS — M7552 Bursitis of left shoulder: Secondary | ICD-10-CM | POA: Diagnosis not present

## 2015-09-13 DIAGNOSIS — G43009 Migraine without aura, not intractable, without status migrainosus: Secondary | ICD-10-CM

## 2015-09-13 DIAGNOSIS — M545 Low back pain, unspecified: Secondary | ICD-10-CM

## 2015-09-13 DIAGNOSIS — M5489 Other dorsalgia: Secondary | ICD-10-CM

## 2015-09-13 DIAGNOSIS — M542 Cervicalgia: Secondary | ICD-10-CM

## 2015-09-13 DIAGNOSIS — G8929 Other chronic pain: Secondary | ICD-10-CM | POA: Insufficient documentation

## 2015-09-13 DIAGNOSIS — M25512 Pain in left shoulder: Secondary | ICD-10-CM

## 2015-09-13 MED ORDER — FLUCONAZOLE 150 MG PO TABS: 150.0000 mg | ORAL_TABLET | Freq: Every day | ORAL | Status: DC

## 2015-09-13 MED ORDER — TIZANIDINE HCL 4 MG PO TABS: ORAL_TABLET | ORAL | Status: DC

## 2015-09-13 MED ORDER — OXYCODONE-ACETAMINOPHEN 10-325 MG PO TABS: 1.0000 | ORAL_TABLET | Freq: Four times a day (QID) | ORAL | Status: DC | PRN

## 2015-09-13 NOTE — Progress Notes (Signed)
t  [facility]  PATIENT: Kimberly Mann DOB: 12/31/1955  REFERRING CLINICIAN: [name]  HISTORY FROM: Patient  REASON FOR VISIT: Left shoulder and neck pain; back pain   HISTORICAL  CHIEF COMPLAINT:  Chief Complaint  Patient presents with  . Neck Pain    Sts. pain is worse in both shoulders and requests inj. today if available/fim  . Back Pain  . Left Hip Pain    HISTORY OF PRESENT ILLNESS:  Kimberly Mann is a 60 yo woman with left > right shoulder pain, neck pain and lower back/buttock pain,   She reports bilateral shoulder pain (left worse than right) and right > left upper arm pain.   In the past, she had a benefit form subacromial bursa injection.  ROM is mildly reduced in shoulders.    No arm weakness.  She reports Left neck pain that is worse with movements and better with rest.  Pain is crampy.  No numbness or weakness..    She has had some left > right lower back pain but its not as bad as the shoulder and neck.    When present, pain is in the left buttock and hip.  Pain does not radiate into her legs.     Med's:  Muscle relaxants have helped her pain some.   She tolerates tizanidine well. Spasms worse at night.  Oxycodone has helped      Migraine headaches are stable with just a couple/month.   Percocet helps to knock them out.       REVIEW OF SYSTEMS:  Constitutional: No fevers, chills, sweats, or change in appetite Eyes: No visual changes, double vision, eye pain Ear, nose and throat: No hearing loss, ear pain, nasal congestion, sore throat Cardiovascular: No chest pain, palpitations Respiratory:  No shortness of breath at rest or with exertion.   No wheezes GastrointestinaI: No nausea, vomiting, diarrhea, abdominal pain, fecal incontinence Genitourinary:  No dysuria, urinary retention or frequency.  No nocturia. Musculoskeletal:  see above Integumentary: No rash, pruritus, skin lesions Neurological: as above Psychiatric: No  depression at this time.  No anxiety Endocrine: No palpitations, diaphoresis, change in appetite, change in weigh or increased thirst  ALLERGIES: Allergies  Allergen Reactions  . Penicillins   . Sulfa Antibiotics     HOME MEDICATIONS: Outpatient Prescriptions Prior to Visit  Medication Sig Dispense Refill  . amLODipine (NORVASC) 5 MG tablet Take 5 mg by mouth daily.      . diclofenac sodium (VOLTAREN) 1 % GEL Apply 2 g topically 4 (four) times daily. 100 g 2  . hydrochlorothiazide 25 MG tablet Take 25 mg by mouth daily.      . hydrOXYzine (VISTARIL) 25 MG capsule Take 1 capsule (25 mg total) by mouth 3 (three) times daily as needed. 60 capsule 0  . imipramine (TOFRANIL) 25 MG tablet Take one or two pills at bedtime 60 tablet 11  . loratadine (CLARITIN) 10 MG tablet     . Multiple Vitamins-Minerals (MULTIVITAMIN ADULT PO)     . oxyCODONE-acetaminophen (PERCOCET) 10-325 MG tablet Take 1 tablet by mouth every 6 (six) hours as needed for pain. 120 tablet 0  . promethazine (PHENERGAN) 25 MG tablet     . tiZANidine (ZANAFLEX) 4 MG tablet One pill in am, one pill po mid-day and one or two po at bedtime 120 tablet 11  . TYPHIM VI 25 MCG/0.5ML injection Reported on 05/17/2015  0   No facility-administered medications prior to visit.  PAST MEDICAL HISTORY: Past Medical History  Diagnosis Date  . Hypertension     PAST SURGICAL HISTORY: Past Surgical History  Procedure Laterality Date  . Tubal ligation    . Liposuction      FAMILY HISTORY: Family History  Problem Relation Age of Onset  . Hypertension Mother     SOCIAL HISTORY:  Social History   Social History  . Marital Status: Legally Separated    Spouse Name: N/A  . Number of Children: N/A  . Years of Education: N/A   Occupational History  . Not on file.   Social History Main Topics  . Smoking status: Former Smoker    Quit date: 05/17/1974  . Smokeless tobacco: Not on file  . Alcohol Use: 4.2 oz/week    7  Standard drinks or equivalent per week     Comment: one glass of wine with dinner/fim  . Drug Use: No  . Sexual Activity: Not on file   Other Topics Concern  . Not on file   Social History Narrative     PHYSICAL EXAM  Filed Vitals:   09/13/15 1405  BP: 132/88  Pulse: 74  Resp: 18  Height: 5\' 6"  (1.676 m)  Weight: 235 lb (106.595 kg)    Body mass index is 37.95 kg/(m^2).   General: The patient is well-developed and well-nourished and in no acute distress  Neck:  The neck is mildly tender over the lower left paraspinal muscles and trapezius muscles  Musculoskeletal:   The shoulder are moderately tender over the subacromial bursae.  ROM is reduced in left shoulder. Neck tender over lower left paraspinal muscles and trapezius.   The back is tender over lower L>R paraspinal muscles       Neurologic Exam  Mental status: The patient is alert and oriented x 3 at the time of the examination. The patient has apparent normal recent and remote memory, with an apparently normal attention span and concentration ability.    Cranial nerves:    Trapezius and sternocleidomastoid strength is normal. No dysarthria is noted.    No obvious hearing deficits are noted.  Motor:  Muscle bulk and tone are normal. Strength is  5 / 5 in all 4 extremities.   Sensory: Sensory testing is intact to soft touch sensationin all 4 extremities.  Gait and station: Station and gait are normal. Tandem gait is normal.      DIAGNOSTIC DATA (LABS, IMAGING, TESTING) - I reviewed patient records, labs, notes, testing and imaging myself where available.     ASSESSMENT AND PLAN   Back pain, lumbosacral - Plan: oxyCODONE-acetaminophen (PERCOCET) 10-325 MG tablet  Subacromial bursitis, left  Bilateral shoulder pain  Neck pain  Midline low back pain without sciatica  Migraine without aura and without status migrainosus, not intractable   1.   Bilateral subacromial bursa  injection with 40 mg  Depo-Medrol in Marcaine using sterile technique. 2.    Left C6C7 paraspinal, left trapezius and Bilateral L4L5 paraspinal muscle trigger point injections with 40 mg DepoMedrol in Marcaine using sterile technique 3.  Refill percocet.   Diflucan for 5 days (often gets thrush after TPI)  She will return to see me in about 4 months, sooner if she has new or worsening neurologic symptoms.  Gardy Montanari A. Felecia Shelling, MD, PhD [id number], [id number] PM Certified in Neurology, Clinical Neurophysiology, Sleep Medicine, Pain Medicine and Neuroimaging  [facility] [address] [place], [place] [address] [phone]

## 2015-10-07 ENCOUNTER — Emergency Department (HOSPITAL_BASED_OUTPATIENT_CLINIC_OR_DEPARTMENT_OTHER): Payer: Medicare HMO

## 2015-10-07 ENCOUNTER — Emergency Department (HOSPITAL_BASED_OUTPATIENT_CLINIC_OR_DEPARTMENT_OTHER)
Admission: EM | Admit: 2015-10-07 | Discharge: 2015-10-07 | Disposition: A | Discharge status: Home / Self Care | Payer: Medicare HMO | Attending: Emergency Medicine | Admitting: Emergency Medicine

## 2015-10-07 ENCOUNTER — Encounter (HOSPITAL_BASED_OUTPATIENT_CLINIC_OR_DEPARTMENT_OTHER): Payer: Self-pay | Admitting: Emergency Medicine

## 2015-10-07 DIAGNOSIS — F0781 Postconcussional syndrome: Secondary | ICD-10-CM

## 2015-10-07 DIAGNOSIS — Z87891 Personal history of nicotine dependence: Secondary | ICD-10-CM | POA: Insufficient documentation

## 2015-10-07 DIAGNOSIS — I10 Essential (primary) hypertension: Secondary | ICD-10-CM | POA: Diagnosis not present

## 2015-10-07 DIAGNOSIS — R110 Nausea: Secondary | ICD-10-CM | POA: Diagnosis not present

## 2015-10-07 DIAGNOSIS — R0981 Nasal congestion: Secondary | ICD-10-CM | POA: Diagnosis not present

## 2015-10-07 DIAGNOSIS — R51 Headache: Secondary | ICD-10-CM

## 2015-10-07 DIAGNOSIS — G44309 Post-traumatic headache, unspecified, not intractable: Secondary | ICD-10-CM | POA: Insufficient documentation

## 2015-10-07 DIAGNOSIS — Y9344 Activity, trampolining: Secondary | ICD-10-CM | POA: Insufficient documentation

## 2015-10-07 DIAGNOSIS — R42 Dizziness and giddiness: Secondary | ICD-10-CM

## 2015-10-07 DIAGNOSIS — Z79899 Other long term (current) drug therapy: Secondary | ICD-10-CM | POA: Diagnosis not present

## 2015-10-07 DIAGNOSIS — Y999 Unspecified external cause status: Secondary | ICD-10-CM | POA: Insufficient documentation

## 2015-10-07 DIAGNOSIS — Y929 Unspecified place or not applicable: Secondary | ICD-10-CM | POA: Diagnosis not present

## 2015-10-07 DIAGNOSIS — H9202 Otalgia, left ear: Secondary | ICD-10-CM

## 2015-10-07 DIAGNOSIS — R519 Headache, unspecified: Secondary | ICD-10-CM

## 2015-10-07 DIAGNOSIS — W1789XA Other fall from one level to another, initial encounter: Secondary | ICD-10-CM | POA: Diagnosis not present

## 2015-10-07 HISTORY — DX: Dizziness and giddiness: R42

## 2015-10-07 LAB — CBC WITH DIFFERENTIAL/PLATELET
Basophils Absolute: 0 10*3/uL (ref 0.0–0.1)
Basophils Relative: 1 %
Eosinophils Absolute: 0.2 10*3/uL (ref 0.0–0.7)
Eosinophils Relative: 4 %
HCT: 38.7 % (ref 36.0–46.0)
Hemoglobin: 12.5 g/dL (ref 12.0–15.0)
Lymphocytes Relative: 43 %
Lymphs Abs: 2.5 10*3/uL (ref 0.7–4.0)
MCH: 27.5 pg (ref 26.0–34.0)
MCHC: 32.3 g/dL (ref 30.0–36.0)
MCV: 85.2 fL (ref 78.0–100.0)
Monocytes Absolute: 0.4 10*3/uL (ref 0.1–1.0)
Monocytes Relative: 6 %
Neutro Abs: 2.8 10*3/uL (ref 1.7–7.7)
Neutrophils Relative %: 46 %
Platelets: 290 10*3/uL (ref 150–400)
RBC: 4.54 MIL/uL (ref 3.87–5.11)
RDW: 15.7 % — ABNORMAL HIGH (ref 11.5–15.5)
WBC: 6 10*3/uL (ref 4.0–10.5)

## 2015-10-07 LAB — BASIC METABOLIC PANEL
Anion gap: 9 (ref 5–15)
BUN: 18 mg/dL (ref 6–20)
CO2: 26 mmol/L (ref 22–32)
Calcium: 9.5 mg/dL (ref 8.9–10.3)
Chloride: 105 mmol/L (ref 101–111)
Creatinine, Ser: 0.94 mg/dL (ref 0.44–1.00)
GFR calc Af Amer: >60 mL/min (ref >60)
GFR calc non Af Amer: >60 mL/min (ref >60)
Glucose, Bld: 91 mg/dL (ref 65–99)
Potassium: 3.4 mmol/L — ABNORMAL LOW (ref 3.5–5.1)
Sodium: 140 mmol/L (ref 135–145)

## 2015-10-07 MED ORDER — CETIRIZINE-PSEUDOEPHEDRINE ER 5-120 MG PO TB12: 1.0000 | ORAL_TABLET | Freq: Two times a day (BID) | ORAL | Status: DC

## 2015-10-07 MED ORDER — IBUPROFEN 800 MG PO TABS: 800.0000 mg | ORAL_TABLET | Freq: Three times a day (TID) | ORAL | Status: DC

## 2015-10-07 MED ORDER — MECLIZINE HCL 25 MG PO TABS
12.5000 mg | ORAL_TABLET | Freq: Once | ORAL | Status: DC
  Filled 2015-10-07: qty 1

## 2015-10-07 MED ORDER — LORAZEPAM 0.5 MG PO TABS: 1.0000 mg | ORAL_TABLET | Freq: Three times a day (TID) | ORAL | Status: DC | PRN

## 2015-10-07 NOTE — ED Provider Notes (Signed)
CSN: [id number]     Arrival date & time 10/07/15  1529 History   First MD Initiated Contact with Patient 10/07/15 1602     Chief Complaint  Patient presents with  . Head Injury     (Consider location/radiation/quality/duration/timing/severity/associated sxs/prior Treatment) HPI Comments: Patient is a 60 year old female with history of BPPV who presents with intermittent headaches, nausea since a fall on May 23. Patient reports she was bouncing on a mini trampoline for exercise and fell off and hit the back of her head on a stone fireplace. Patient immediately got up and felt a little out of it. Patient has since had intermittent headaches and nausea mostly in the morning. Patient reports she watches a lot of TV sometimes until 5 or 6 in the morning. Patient also began last week with left ear pain, productive cough with clear sputum and nasal congestion. Patient has had associated dizziness with head movement, especially looking up. Patient states this is the normal start of her vertigo episodes and it always starts in her left ear. She states that pollen is a trigger for her nasal congestion and she usually feels this way when she goes outside. Patient also reports associated bilateral, tender cervical adenopathy. Patient took 2 pills of the Z-Pak last week without relief. Patient reports she usually gets an antibiotic and steroid injection at her doctor's office. Patient is lightheaded now. Patient denies any chest pain, shortness of breath, abdominal pain.  Patient is a 60 y.o. female presenting with head injury. The history is provided by the patient.  Head Injury Associated symptoms: headache and nausea   Associated symptoms: no vomiting     Past Medical History  Diagnosis Date  . Hypertension   . Vertigo    Past Surgical History  Procedure Laterality Date  . Tubal ligation    . Liposuction     Family History  Problem Relation Age of Onset  . Hypertension Mother    Social History   Substance Use Topics  . Smoking status: Former Smoker    Quit date: 05/17/1974  . Smokeless tobacco: None  . Alcohol Use: 4.2 oz/week    7 Standard drinks or equivalent per week     Comment: one glass of wine with dinner/fim   OB History    No data available     Review of Systems  Constitutional: Negative for fever and chills.  HENT: Positive for congestion and ear pain (L). Negative for facial swelling and sore throat.   Respiratory: Positive for cough. Negative for shortness of breath.   Cardiovascular: Negative for chest pain.  Gastrointestinal: Positive for nausea. Negative for vomiting and abdominal pain.  Genitourinary: Negative for dysuria.  Musculoskeletal: Negative for back pain.  Skin: Negative for rash and wound.  Neurological: Positive for dizziness, light-headedness and headaches.  Psychiatric/Behavioral: The patient is not nervous/anxious.       Allergies  Penicillins and Sulfa antibiotics  Home Medications   Prior to Admission medications   Medication Sig Start Date End Date Taking? Authorizing Provider  amLODipine (NORVASC) 5 MG tablet Take 5 mg by mouth daily.      Historical Provider, MD  cetirizine-pseudoephedrine (ZYRTEC-D) 5-120 MG tablet Take 1 tablet by mouth 2 (two) times daily. 10/07/15   Frederica Kuster, PA-C  diclofenac sodium (VOLTAREN) 1 % GEL Apply 2 g topically 4 (four) times daily. 05/17/15   Britt Bottom, MD  fluconazole (DIFLUCAN) 150 MG tablet Take 1 tablet (150 mg total) by mouth daily. 09/13/15  Britt Bottom, MD  hydrochlorothiazide 25 MG tablet Take 25 mg by mouth daily.      Historical Provider, MD  hydrOXYzine (VISTARIL) 25 MG capsule Take 1 capsule (25 mg total) by mouth 3 (three) times daily as needed. 05/17/15   Britt Bottom, MD  ibuprofen (ADVIL,MOTRIN) 800 MG tablet Take 1 tablet (800 mg total) by mouth 3 (three) times daily. 10/07/15   Frederica Kuster, PA-C  imipramine (TOFRANIL) 25 MG tablet Take one or two pills at  bedtime 05/17/14   Britt Bottom, MD  loratadine (CLARITIN) 10 MG tablet  03/05/15   Historical Provider, MD  LORazepam (ATIVAN) 0.5 MG tablet Take 2 tablets (1 mg total) by mouth 3 (three) times daily as needed (for dizziness). 10/07/15   Frederica Kuster, PA-C  Multiple Vitamins-Minerals (MULTIVITAMIN ADULT PO)  03/29/15   Historical Provider, MD  oxyCODONE-acetaminophen (PERCOCET) 10-325 MG tablet Take 1 tablet by mouth every 6 (six) hours as needed for pain. 09/13/15   Britt Bottom, MD  promethazine (PHENERGAN) 25 MG tablet  03/05/15   Historical Provider, MD  tiZANidine (ZANAFLEX) 4 MG tablet One pill in am, one pill po mid-day and one or two po at bedtime 09/13/15   Britt Bottom, MD  TYPHIM VI 25 MCG/0.5ML injection Reported on 05/17/2015 09/22/14   Historical Provider, MD   BP 148/94 mmHg  Pulse 74  Temp(Src) 98.2 F (36.8 C) (Oral)  Resp 20  Ht 5\' 5"  (1.651 m)  Wt 106.142 kg  BMI 38.94 kg/m2  SpO2 100% Physical Exam  Constitutional: She appears well-developed and well-nourished. No distress.  HENT:  Head: Normocephalic and atraumatic.  Right Ear: Tympanic membrane normal.  Left Ear: Tympanic membrane normal.  Mouth/Throat: Oropharynx is clear and moist. No oropharyngeal exudate.  Eyes: Conjunctivae and EOM are normal. Pupils are equal, round, and reactive to light. Right eye exhibits no discharge. Left eye exhibits no discharge. No scleral icterus.  Neck: Normal range of motion. Neck supple. No thyromegaly present.  Cardiovascular: Normal rate, regular rhythm, normal heart sounds and intact distal pulses.  Exam reveals no gallop and no friction rub.   No murmur heard. Pulmonary/Chest: Effort normal and breath sounds normal. No stridor. No respiratory distress. She has no wheezes. She has no rales.  Abdominal: Soft. Bowel sounds are normal. She exhibits no distension. There is no tenderness. There is no rebound and no guarding.  Musculoskeletal: She exhibits no edema.   Lymphadenopathy:    She has no cervical adenopathy.  Neurological: She is alert. Coordination normal.  CN 3-12 intact; normal sensation throughout; 5/5 strength in all 4 extremities; equal bilateral grip strength   Skin: Skin is warm and dry. No rash noted. She is not diaphoretic. No pallor.  Psychiatric: She has a normal mood and affect.  Nursing note and vitals reviewed.   ED Course  Procedures (including critical care time) Labs Review Labs Reviewed  CBC WITH DIFFERENTIAL/PLATELET - Abnormal; Notable for the following:    RDW 15.7 (*)    All other components within normal limits  BASIC METABOLIC PANEL - Abnormal; Notable for the following:    Potassium 3.4 (*)    All other components within normal limits    Imaging Review Dg Chest 2 View  10/07/2015  CLINICAL DATA:  [place] off trampoline 09/19/2015, intermittent headache, productive cough EXAM: CHEST  2 VIEW COMPARISON:  12/13/2008 FINDINGS: Cardiomediastinal silhouette is stable. No acute infiltrate or pleural effusion. No pulmonary edema. Mild degenerative  changes lower thoracic spine. IMPRESSION: No active cardiopulmonary disease. Electronically Signed   By: Lahoma Crocker M.D.   On: 10/07/2015 17:26   I have personally reviewed and evaluated these images and lab results as part of my medical decision-making.   EKG Interpretation None      MDM   Normal neuro exam, no focal deficits. CBC, BMP unremarkable. CXR shows no active cardiopulmonary disease. Due to patient's delayed presentation, CT scan would not be beneficial at this time. I suspect postconcussion syndrome as cause of intermittent posterior headaches and a.m. nausea. I advised patient to reduce screen time to 15-20 minutes per day. Patient also presenting with symptoms consistent with her history of BPPV. Patient stated that Antivert did not help her and patient was driving and I could not give her Ativan here. Patient discharged with small amount of Ativan and  ibuprofen for ear pain. Patient to follow up with PCP and physical therapist for treatment for her BPPV. Patient advised to follow up with PCP if symptoms persist for outpatient MRI. Strict return precautions given. Patient also evaluated by Dr. Ralene Bathe who is in agreement with plan. Patient vitals stable throughout ED course and discharged in satisfactory condition. Patient understands and is in agreement with plan.  Final diagnoses:  Dizziness  Nonintractable headache, unspecified chronicity pattern, unspecified headache type  Post concussion syndrome  Nausea  Nasal congestion  Left ear pain       Frederica Kuster, PA-C 10/08/15 0108  Quintella Reichert, MD 10/10/15 [phone]

## 2015-10-07 NOTE — Discharge Instructions (Signed)
Medications: Zyrtec-D, Ativan, ibuprofen  Treatment: Take Zyrtec-D as prescribed for your nasal congestion and ear pain. Take Ativan as needed 3 times a day for your dizziness. Take ibuprofen 3 times daily as needed for your pain. Reduce your screen time to 15-20 minutes a day, which includes television, computer, phone, tablet.  Follow-up: Please follow-up with your primary care provider next week for further evaluation and treatment of your symptoms. Please return to emergency department if you develop any new or worsening symptoms.   Post-Concussion Syndrome Post-concussion syndrome describes the symptoms that can occur after a head injury. These symptoms can last from weeks to months. CAUSES  It is not clear why some head injuries cause post-concussion syndrome. It can occur whether your head injury was mild or severe and whether you were wearing head protection or not.  SIGNS AND SYMPTOMS  Memory difficulties.  Dizziness.  Headaches.  Double vision or blurry vision.  Sensitivity to light.  Hearing difficulties.  Depression.  Tiredness.  Weakness.  Difficulty with concentration.  Difficulty sleeping or staying asleep.  Vomiting.  Poor balance or instability on your feet.  Slow reaction time.  Difficulty learning and remembering things you have heard. DIAGNOSIS  There is no test to determine whether you have post-concussion syndrome. Your health care provider may order an imaging scan of your brain, such as a CT scan, to check for other problems that may be causing your symptoms (such as a severe injury inside your skull). TREATMENT  Usually, these problems disappear over time without medical care. Your health care provider may prescribe medicine to help ease your symptoms. It is important to follow up with a neurologist to evaluate your recovery and address any lingering symptoms or issues. HOME CARE INSTRUCTIONS   Take medicines only as directed by your health  care provider. Do not take aspirin. Aspirin can slow blood clotting.  Sleep with your head slightly elevated to help with headaches.  Avoid any situation where there is potential for another head injury. This includes football, hockey, soccer, basketball, martial arts, downhill snow sports, and horseback riding. Your condition will get worse every time you experience a concussion. You should avoid these activities until you are evaluated by the appropriate follow-up health care providers.  Keep all follow-up visits as directed by your health care provider. This is important. SEEK MEDICAL CARE IF:  You have increased problems paying attention or concentrating.  You have increased difficulty remembering or learning new information.  You need more time to complete tasks or assignments than before.  You have increased irritability or decreased ability to cope with stress.  You have more symptoms than before. Seek medical care if you have any of the following symptoms for more than two weeks after your injury:  Lasting (chronic) headaches.  Dizziness or balance problems.  Nausea.  Vision problems.  Increased sensitivity to noise or light.  Depression or mood swings.  Anxiety or irritability.  Memory problems.  Difficulty concentrating or paying attention.  Sleep problems.  Feeling tired all the time. SEEK IMMEDIATE MEDICAL CARE IF:  You have confusion or unusual drowsiness.  Others find it difficult to wake you up.  You have nausea or persistent, forceful vomiting.  You feel like you are moving when you are not (vertigo). Your eyes may move rapidly back and forth.  You have convulsions or faint.  You have severe, persistent headaches that are not relieved by medicine.  You cannot use your arms or legs normally.  One  of your pupils is larger than the other.  You have clear or bloody discharge from your nose or ears.  Your problems are getting worse, not  better. MAKE SURE YOU:  Understand these instructions.  Will watch your condition.  Will get help right away if you are not doing well or get worse.   This information is not intended to replace advice given to you by your health care provider. Make sure you discuss any questions you have with your health care provider.   Document Released: 10/05/2001 Document Revised: 05/06/2014 Document Reviewed: 07/21/2013 Elsevier Interactive Patient Education [employer].

## 2015-10-07 NOTE — ED Notes (Signed)
[employer] tr[employer] in May and hit head on fireplace. Has had continued headache. +dizziness and intermittent nausea.

## 2015-10-07 NOTE — ED Notes (Signed)
Pt given d/c instructions as per chart. Verbalizes understanding. No questions. Rx x 3 with advisory on Ativan.

## 2015-10-07 NOTE — ED Notes (Signed)
Patient states she fell off her mini trampoline on 09/19/15 and hit her head on the posterior scalp.  No loc.  States she has had intermittent headaches and nausea since.  States she also has had a productive cough with clear secretions, hoarseness of her throat and left ear ache.  States she has vertigo and feels somewhat dizzy.

## 2015-10-24 ENCOUNTER — Telehealth: Payer: Self-pay | Admitting: Neurology

## 2015-10-24 DIAGNOSIS — M545 Low back pain, unspecified: Secondary | ICD-10-CM

## 2015-10-24 MED ORDER — OXYCODONE-ACETAMINOPHEN 10-325 MG PO TABS: 1.0000 | ORAL_TABLET | Freq: Four times a day (QID) | ORAL | Status: DC | PRN

## 2015-10-24 NOTE — Telephone Encounter (Signed)
Patient called to request refill of oxyCODONE-acetaminophen (PERCOCET) 10-325 MG tablet

## 2015-10-24 NOTE — Telephone Encounter (Signed)
Rx. awaiting RAS sig/fim 

## 2015-10-24 NOTE — Telephone Encounter (Signed)
Oxycodone rx. up front [facility]/fim 

## 2015-11-22 ENCOUNTER — Telehealth: Payer: Self-pay | Admitting: Neurology

## 2015-11-22 DIAGNOSIS — M545 Low back pain, unspecified: Secondary | ICD-10-CM

## 2015-11-22 MED ORDER — OXYCODONE-ACETAMINOPHEN 10-325 MG PO TABS: 1.0000 | ORAL_TABLET | Freq: Four times a day (QID) | ORAL | 0 refills | Status: DC | PRN

## 2015-11-22 NOTE — Addendum Note (Signed)
Addended by: France Ravens I on: 11/22/2015 01:47 PM   Modules accepted: Orders

## 2015-11-22 NOTE — Telephone Encounter (Signed)
Rx. awaiting RAS sig/fim 

## 2015-11-22 NOTE — Telephone Encounter (Signed)
Rx. up front [facility]/fim 

## 2015-11-22 NOTE — Telephone Encounter (Signed)
Patient is calling to get a written Rx for oxyCODONE-acetaminophen (PERCOCET) 10-325 MG tablet.  I advised the Rx will be ready in 24 hours unless the nurse advises otherwise.

## 2015-12-27 ENCOUNTER — Telehealth: Payer: Self-pay | Admitting: Neurology

## 2015-12-27 ENCOUNTER — Other Ambulatory Visit: Payer: Self-pay | Admitting: *Deleted

## 2015-12-27 DIAGNOSIS — M545 Low back pain, unspecified: Secondary | ICD-10-CM

## 2015-12-27 DIAGNOSIS — G894 Chronic pain syndrome: Secondary | ICD-10-CM

## 2015-12-27 MED ORDER — OXYCODONE-ACETAMINOPHEN 10-325 MG PO TABS: 1.0000 | ORAL_TABLET | Freq: Four times a day (QID) | ORAL | 0 refills | Status: DC | PRN

## 2015-12-27 NOTE — Telephone Encounter (Signed)
Oxycodone rx. up front [facility]/fim 

## 2015-12-27 NOTE — Telephone Encounter (Signed)
Patient called to request refill of oxyCODONE-acetaminophen (PERCOCET) 10-325 MG tablet

## 2015-12-27 NOTE — Telephone Encounter (Signed)
Rx. awaiting RAS sig/fim 

## 2015-12-29 ENCOUNTER — Other Ambulatory Visit (INDEPENDENT_AMBULATORY_CARE_PROVIDER_SITE_OTHER): Payer: Self-pay

## 2015-12-29 DIAGNOSIS — G894 Chronic pain syndrome: Secondary | ICD-10-CM

## 2015-12-29 DIAGNOSIS — Z0289 Encounter for other administrative examinations: Secondary | ICD-10-CM

## 2016-01-05 LAB — BENZODIAZEPINES CONFIRM, URINE
ALPRAZOLAM: NEGATIVE
BENZODIAZEPINES: NEGATIVE ng/mL
CLONAZEPAM: NEGATIVE
FLURAZEPAM UR: NEGATIVE
LORAZEPAM: NEGATIVE
MIDAZOLAM: NEGATIVE
NORDIAZEPAM: NEGATIVE
OXAZEPAM UR: NEGATIVE
TEMAZEPAM: NEGATIVE
TRIAZOLAM: NEGATIVE

## 2016-01-05 LAB — OXYCODONE/OXYMORPHONE CONFIRM
OXYCODONE/OXYMORPH: POSITIVE — AB
OXYCODONE: >3000 ng/mL
OXYCODONE: POSITIVE — AB
OXYMORPHONE CONFIRM: 487 ng/mL
OXYMORPHONE: POSITIVE — AB

## 2016-01-05 LAB — MONITOR DRUG PROFILE 10(MW)
AMPHETAMINE SCREEN URINE: NEGATIVE ng/mL
BARBITURATE SCREEN URINE: NEGATIVE ng/mL
CANNABINOIDS UR QL SCN: NEGATIVE ng/mL
COCAINE(METAB.)SCREEN, URINE: NEGATIVE ng/mL
Creatinine(Crt), U: 181.4 mg/dL (ref 20.0–300.0)
Methadone Screen, Urine: NEGATIVE ng/mL
PH UR, DRUG SCRN: 5.6 (ref 4.5–8.9)
Phencyclidine Qn, Ur: NEGATIVE ng/mL
Propoxyphene Scrn, Ur: NEGATIVE ng/mL

## 2016-01-05 LAB — OPIATES CONFIRMATION, URINE: Opiates: NEGATIVE ng/mL

## 2016-01-17 ENCOUNTER — Ambulatory Visit (INDEPENDENT_AMBULATORY_CARE_PROVIDER_SITE_OTHER): Payer: Medicare HMO | Admitting: Neurology

## 2016-01-17 ENCOUNTER — Encounter: Payer: Self-pay | Admitting: Neurology

## 2016-01-17 VITALS — BP 134/84 | Resp 20 | Ht 65.0 in | Wt 220.0 lb

## 2016-01-17 DIAGNOSIS — M542 Cervicalgia: Secondary | ICD-10-CM | POA: Diagnosis not present

## 2016-01-17 DIAGNOSIS — M7062 Trochanteric bursitis, left hip: Secondary | ICD-10-CM | POA: Diagnosis not present

## 2016-01-17 DIAGNOSIS — M7550 Bursitis of unspecified shoulder: Secondary | ICD-10-CM

## 2016-01-17 DIAGNOSIS — M545 Low back pain, unspecified: Secondary | ICD-10-CM

## 2016-01-17 DIAGNOSIS — M25512 Pain in left shoulder: Secondary | ICD-10-CM | POA: Diagnosis not present

## 2016-01-17 DIAGNOSIS — M5489 Other dorsalgia: Secondary | ICD-10-CM

## 2016-01-17 DIAGNOSIS — M25511 Pain in right shoulder: Secondary | ICD-10-CM | POA: Diagnosis not present

## 2016-01-17 MED ORDER — LIDOCAINE 5 % EX OINT: 1.0000 "application " | TOPICAL_OINTMENT | Freq: Two times a day (BID) | CUTANEOUS | 1 refills | Status: DC | PRN

## 2016-01-17 MED ORDER — FLUTICASONE PROPIONATE 50 MCG/ACT NA SUSP: 1.0000 | Freq: Every day | NASAL | 1 refills | Status: DC

## 2016-01-17 MED ORDER — OXYCODONE-ACETAMINOPHEN 10-325 MG PO TABS: 1.0000 | ORAL_TABLET | Freq: Four times a day (QID) | ORAL | 0 refills | Status: DC | PRN

## 2016-01-17 NOTE — Progress Notes (Signed)
t  [facility]  PATIENT: Kimberly Mann DOB: Jul 22, 1955  REFERRING CLINICIAN: [name]  HISTORY FROM: Patient  REASON FOR VISIT: Left shoulder and neck pain; back pain [  HISTORICAL  CHIEF COMPLAINT:  Chief Complaint  Patient presents with  . Back Pain    Sts. lower back is stiff right now.  Sts. she has left earache, sinus congestion, other than that h/a's have been fine./fim  . Migranes    HISTORY OF PRESENT ILLNESS:  Kimberly Mann is a 60 yo woman with left shoulder pain, neck pain and left lower back/buttock pain.     Shoulders:     She reports left > right.  Pain increases with external rotation and elevation.   In the past, she had a benefit form subacromial bursa injection.  ROM is mildly reduced in shoulders.    No arm weakness.  Neck pain:   She also reports Left > right neck pain that is worse with movements and better with rest.  Pain is crampy.  No numbness or weakness.Marland Kitchen    LBP/hip pain:   She  reports left > right lower back pain.    Pain is also in the left buttock and hip.  Pain does not radiate into her legs.     Med's:  Muscle relaxants have helped her pain some.   She tolerates tizanidine well. Spasms worse at night.  Oxycodone has helped     Migraine headaches are stable with just a couple/month.   Percocet helps to knock them out.     Stress:    Her son died last year and she has had some depression since    REVIEW OF SYSTEMS:  Constitutional: No fevers, chills, sweats, or change in appetite Eyes: No visual changes, double vision, eye pain Ear, nose and throat: No hearing loss, ear pain, nasal congestion, sore throat Cardiovascular: No chest pain, palpitations Respiratory:  No shortness of breath at rest or with exertion.   No wheezes GastrointestinaI: No nausea, vomiting, diarrhea, abdominal pain, fecal incontinence Genitourinary:  No dysuria, urinary retention or frequency.  No nocturia. Musculoskeletal:  see  above Integumentary: No rash, pruritus, skin lesions Neurological: as above Psychiatric: No depression at this time.  No anxiety Endocrine: No palpitations, diaphoresis, change in appetite, change in weigh or increased thirst  ALLERGIES: Allergies  Allergen Reactions  . Penicillins   . Sulfa Antibiotics     HOME MEDICATIONS: Outpatient Medications Prior to Visit  Medication Sig Dispense Refill  . amLODipine (NORVASC) 5 MG tablet Take 5 mg by mouth daily.      . cetirizine-pseudoephedrine (ZYRTEC-D) 5-120 MG tablet Take 1 tablet by mouth 2 (two) times daily. 30 tablet 0  . diclofenac sodium (VOLTAREN) 1 % GEL Apply 2 g topically 4 (four) times daily. 100 g 2  . fluconazole (DIFLUCAN) 150 MG tablet Take 1 tablet (150 mg total) by mouth daily. 15 tablet 0  . hydrochlorothiazide 25 MG tablet Take 25 mg by mouth daily.      . hydrOXYzine (VISTARIL) 25 MG capsule Take 1 capsule (25 mg total) by mouth 3 (three) times daily as needed. 60 capsule 0  . ibuprofen (ADVIL,MOTRIN) 800 MG tablet Take 1 tablet (800 mg total) by mouth 3 (three) times daily. 21 tablet 0  . imipramine (TOFRANIL) 25 MG tablet Take one or two pills at bedtime 60 tablet 11  . loratadine (CLARITIN) 10 MG tablet     . LORazepam (ATIVAN) 0.5 MG tablet Take 2 tablets (  1 mg total) by mouth 3 (three) times daily as needed (for dizziness). 6 tablet 0  . Multiple Vitamins-Minerals (MULTIVITAMIN ADULT PO)     . promethazine (PHENERGAN) 25 MG tablet     . tiZANidine (ZANAFLEX) 4 MG tablet One pill in am, one pill po mid-day and one or two po at bedtime 120 tablet 11  . oxyCODONE-acetaminophen (PERCOCET) 10-325 MG tablet Take 1 tablet by mouth every 6 (six) hours as needed for pain. 120 tablet 0  . TYPHIM VI 25 MCG/0.5ML injection Reported on 05/17/2015  0   No facility-administered medications prior to visit.     PAST MEDICAL HISTORY: Past Medical History:  Diagnosis Date  . Hypertension   . Vertigo     PAST SURGICAL  HISTORY: Past Surgical History:  Procedure Laterality Date  . LIPOSUCTION    . TUBAL LIGATION      FAMILY HISTORY: Family History  Problem Relation Age of Onset  . Hypertension Mother     SOCIAL HISTORY:  Social History   Social History  . Marital status: Legally Separated    Spouse name: N/A  . Number of children: N/A  . Years of education: N/A   Occupational History  . Not on file.   Social History Main Topics  . Smoking status: Former Smoker    Quit date: 05/17/1974  . Smokeless tobacco: Not on file  . Alcohol use 4.2 oz/week    7 Standard drinks or equivalent per week     Comment: one glass of wine with dinner/fim  . Drug use: No  . Sexual activity: Not on file   Other Topics Concern  . Not on file   Social History Narrative  . No narrative on file     PHYSICAL EXAM  Vitals:   01/17/16 1414  BP: 134/84  Resp: 20  Weight: 220 lb (99.8 kg)  Height: 5\' 5"  (1.651 m)    Body mass index is 36.61 kg/m.   General: The patient is well-developed and well-nourished and in no acute distress  Neck:  The neck is mildly tender over the lower left paraspinal muscles and trapezius muscles  Musculoskeletal:   The shoulder are moderately tender over the subacromial bursae.  ROM is reduced in left shoulder. She is also tender trochanteric bursa on the left.      Mild left piriformis tenderness.   Neurologic Exam  Mental status: The patient is alert and oriented x 3 at the time of the examination. The patient has apparent normal recent and remote memory, with an apparently normal attention span and concentration ability.    Cranial nerves:    Trapezius and sternocleidomastoid strength is normal. No dysarthria is noted.    No obvious hearing deficits are noted.  Motor:  Muscle bulk and tone are normal. Strength is  5 / 5 in all 4 extremities.   Sensory: Sensory testing is intact to soft touch sensationin all 4 extremities.  Gait and station: Station and gait  are normal. Tandem gait is normal.      DIAGNOSTIC DATA (LABS, IMAGING, TESTING) - I reviewed patient records, labs, notes, testing and imaging myself where available.     ASSESSMENT AND PLAN   Subacromial bursitis, unspecified laterality  Back pain, lumbosacral - Plan: oxyCODONE-acetaminophen (PERCOCET) 10-325 MG tablet  Trochanteric bursitis of left hip  Bilateral shoulder pain  Midline low back pain without sciatica  Neck pain  1.   Bilateral subacromial bursa injection   with 50 mg (total)  Depo-Medrol in Marcaine using sterile technique.   She tolerated the procedure well and there were no complications. 2.     Left trochanteric bursa injection with 30 mg () Depo-Medrol in Marcaine using sterile technique. She tolerated the procedure well and there were no complications. 3.   Refill percocet,  Flonase,  Lidocaine ointment 4.   She will return to see me in about 4 months, sooner if she has new or worsening neurologic symptoms.  Lorenza Shakir A. Felecia Shelling, MD, PhD [id number], 0000000 PM Certified in Neurology, Clinical Neurophysiology, Sleep Medicine, Pain Medicine and Neuroimaging  [facility] [address] [place], [place] [address] [phone]

## 2016-01-17 NOTE — Patient Instructions (Signed)
Today, you received injections into the subacromial bursa of the left shoulder and the trochanteric bursa of the left hip with a total of 80 mg Depo-Medrol in 6 mL Marcaine.

## 2016-01-24 ENCOUNTER — Telehealth: Payer: Self-pay | Admitting: *Deleted

## 2016-01-24 NOTE — Telephone Encounter (Signed)
Fax received from [facility], stating Lidocaine PA has been denied, b/c it is not approved for osteoarthritis, bursitis./fim

## 2016-01-24 NOTE — Telephone Encounter (Signed)
PA for Lidocaine 5% ointment completed and faxed to [facility] fax# [phone]

## 2016-02-22 ENCOUNTER — Telehealth: Payer: Self-pay | Admitting: Neurology

## 2016-02-22 DIAGNOSIS — M545 Low back pain, unspecified: Secondary | ICD-10-CM

## 2016-02-22 MED ORDER — OXYCODONE-ACETAMINOPHEN 10-325 MG PO TABS: 1.0000 | ORAL_TABLET | Freq: Four times a day (QID) | ORAL | 0 refills | Status: DC | PRN

## 2016-02-22 NOTE — Telephone Encounter (Signed)
Rx. up front [facility]/fim 

## 2016-02-22 NOTE — Telephone Encounter (Signed)
Patient requesting refill of oxyCODONE-acetaminophen (PERCOCET) 10-325 MG tablet Pharmacy: pick up

## 2016-02-22 NOTE — Telephone Encounter (Signed)
Rx. awaiting YY sig, as RAS is ooo this week/fim 

## 2016-03-26 ENCOUNTER — Telehealth: Payer: Self-pay | Admitting: Neurology

## 2016-03-26 DIAGNOSIS — M545 Low back pain, unspecified: Secondary | ICD-10-CM

## 2016-03-26 MED ORDER — OXYCODONE-ACETAMINOPHEN 10-325 MG PO TABS: 1.0000 | ORAL_TABLET | Freq: Four times a day (QID) | ORAL | 0 refills | Status: DC | PRN

## 2016-03-26 NOTE — Telephone Encounter (Signed)
Oxycodone rx. up front [facility]/fim 

## 2016-03-26 NOTE — Telephone Encounter (Signed)
Pt request refill for oxyCODONE-acetaminophen (PERCOCET) 10-325 MG tablet °

## 2016-03-26 NOTE — Telephone Encounter (Signed)
RAS sig/fim 

## 2016-04-17 ENCOUNTER — Telehealth: Payer: Self-pay | Admitting: Neurology

## 2016-04-17 DIAGNOSIS — M545 Low back pain, unspecified: Secondary | ICD-10-CM

## 2016-04-17 MED ORDER — OXYCODONE-ACETAMINOPHEN 10-325 MG PO TABS: 1.0000 | ORAL_TABLET | Freq: Four times a day (QID) | ORAL | 0 refills | Status: DC | PRN

## 2016-04-17 NOTE — Telephone Encounter (Signed)
Pts son Corene Cornea called requesting a refill for patient for   oxyCODONE-acetaminophen (PERCOCET) 10-325 MG tablet

## 2016-04-17 NOTE — Telephone Encounter (Signed)
Rx. awaiting RAS sig/fim 

## 2016-04-17 NOTE — Addendum Note (Signed)
Addended by: France Ravens I on: 04/17/2016 01:51 PM   Modules accepted: Orders

## 2016-04-17 NOTE — Telephone Encounter (Signed)
Oxycodone rx. up front [facility]/fim 

## 2016-05-20 ENCOUNTER — Ambulatory Visit
Admission: RE | Admit: 2016-05-20 | Discharge: 2016-05-20 | Disposition: A | Discharge status: Home / Self Care | Payer: Medicare HMO | Source: Ambulatory Visit | Attending: Neurology | Admitting: Neurology

## 2016-05-20 ENCOUNTER — Ambulatory Visit (INDEPENDENT_AMBULATORY_CARE_PROVIDER_SITE_OTHER): Payer: Medicare HMO | Admitting: Neurology

## 2016-05-20 ENCOUNTER — Encounter: Payer: Self-pay | Admitting: Neurology

## 2016-05-20 VITALS — BP 120/78 | HR 72 | Resp 18 | Ht 65.0 in | Wt 240.0 lb

## 2016-05-20 DIAGNOSIS — G43009 Migraine without aura, not intractable, without status migrainosus: Secondary | ICD-10-CM

## 2016-05-20 DIAGNOSIS — M545 Low back pain, unspecified: Secondary | ICD-10-CM

## 2016-05-20 DIAGNOSIS — M7550 Bursitis of unspecified shoulder: Secondary | ICD-10-CM

## 2016-05-20 DIAGNOSIS — M7062 Trochanteric bursitis, left hip: Secondary | ICD-10-CM

## 2016-05-20 DIAGNOSIS — M542 Cervicalgia: Secondary | ICD-10-CM | POA: Diagnosis not present

## 2016-05-20 MED ORDER — OXYCODONE-ACETAMINOPHEN 10-325 MG PO TABS: 1.0000 | ORAL_TABLET | Freq: Four times a day (QID) | ORAL | 0 refills | Status: DC | PRN

## 2016-05-20 MED ORDER — ETODOLAC 400 MG PO TABS: 400.0000 mg | ORAL_TABLET | Freq: Two times a day (BID) | ORAL | 5 refills | Status: DC

## 2016-05-20 NOTE — Progress Notes (Signed)
t  [facility]  PATIENT: Kimberly Mann DOB: 30-Oct-1955  REFERRING CLINICIAN: [name]  HISTORY FROM: Patient  REASON FOR VISIT: Left shoulder and neck pain; back pain [  HISTORICAL  CHIEF COMPLAINT:  Chief Complaint  Patient presents with  . Back Pain    Sts. lbp, bilat shoulder pain is worse/fim  . Shoulder Pain    HISTORY OF PRESENT ILLNESS:  Kimberly Mann is a 61 yo woman with left shoulder pain, neck pain and left lower back/buttock pain.     Shoulders:     She reports left > right shoulder pain.  Her pain increases with external rotation and elevation.   In the past, she had a benefit form subacromial bursa injection. However, she has had some itching following the steroid shots and it was especially bad after the last one 3-4 months ago.    ROM is mildly reduced in shoulders.    No arm weakness.  Neck pain:   She has neck pain that is worse with movements.  Pain is more on the left.   No numbness or weakness.Marland Kitchen    LBP/hip pain:   She  reports left > right lower back pain and left buttock and hip pain.    Pain does not radiate into her legs.   In the past, trochanteric bursa injection has helped.   Med's:  For her pain she is on several medications.  Muscle relaxants have helped her pain some.   She tolerates tizanidine well. Spasms worse at night.  Oxycodone has helped      Migraine headaches are doing worse and she has about 1/week now.    Percocet helps to knock them out.       REVIEW OF SYSTEMS:  Constitutional: No fevers, chills, sweats, or change in appetite Eyes: No visual changes, double vision, eye pain Ear, nose and throat: No hearing loss, ear pain, nasal congestion, sore throat Cardiovascular: No chest pain, palpitations Respiratory:  No shortness of breath at rest or with exertion.   No wheezes GastrointestinaI: No nausea, vomiting, diarrhea, abdominal pain, fecal incontinence Genitourinary:  No dysuria, urinary retention or  frequency.  No nocturia. Musculoskeletal:  see above Integumentary: No rash, pruritus, skin lesions Neurological: as above Psychiatric: No depression at this time.  No anxiety Endocrine: No palpitations, diaphoresis, change in appetite, change in weigh or increased thirst  ALLERGIES: Allergies  Allergen Reactions  . Penicillins   . Sulfa Antibiotics     HOME MEDICATIONS: Outpatient Medications Prior to Visit  Medication Sig Dispense Refill  . amLODipine (NORVASC) 5 MG tablet Take 5 mg by mouth daily.      . cetirizine-pseudoephedrine (ZYRTEC-D) 5-120 MG tablet Take 1 tablet by mouth 2 (two) times daily. 30 tablet 0  . diclofenac sodium (VOLTAREN) 1 % GEL Apply 2 g topically 4 (four) times daily. 100 g 2  . fluconazole (DIFLUCAN) 150 MG tablet Take 1 tablet (150 mg total) by mouth daily. 15 tablet 0  . fluticasone (FLONASE) 50 MCG/ACT nasal spray Place 1 spray into both nostrils daily. 15.8 g 1  . hydrochlorothiazide 25 MG tablet Take 25 mg by mouth daily.      . hydrOXYzine (VISTARIL) 25 MG capsule Take 1 capsule (25 mg total) by mouth 3 (three) times daily as needed. 60 capsule 0  . ibuprofen (ADVIL,MOTRIN) 800 MG tablet Take 1 tablet (800 mg total) by mouth 3 (three) times daily. 21 tablet 0  . imipramine (TOFRANIL) 25 MG tablet Take one  or two pills at bedtime 60 tablet 11  . lidocaine (XYLOCAINE) 5 % ointment Apply 1 application topically 2 (two) times daily as needed. Apply one inch bid as needed 35.44 g 1  . loratadine (CLARITIN) 10 MG tablet     . LORazepam (ATIVAN) 0.5 MG tablet Take 2 tablets (1 mg total) by mouth 3 (three) times daily as needed (for dizziness). 6 tablet 0  . Multiple Vitamins-Minerals (MULTIVITAMIN ADULT PO)     . promethazine (PHENERGAN) 25 MG tablet     . tiZANidine (ZANAFLEX) 4 MG tablet One pill in am, one pill po mid-day and one or two po at bedtime 120 tablet 11  . TYPHIM VI 25 MCG/0.5ML injection Reported on 05/17/2015  0  . oxyCODONE-acetaminophen  (PERCOCET) 10-325 MG tablet Take 1 tablet by mouth every 6 (six) hours as needed for pain. 120 tablet 0   No facility-administered medications prior to visit.     PAST MEDICAL HISTORY: Past Medical History:  Diagnosis Date  . Hypertension   . Vertigo     PAST SURGICAL HISTORY: Past Surgical History:  Procedure Laterality Date  . LIPOSUCTION    . TUBAL LIGATION      FAMILY HISTORY: Family History  Problem Relation Age of Onset  . Hypertension Mother     SOCIAL HISTORY:  Social History   Social History  . Marital status: Legally Separated    Spouse name: N/A  . Number of children: N/A  . Years of education: N/A   Occupational History  . Not on file.   Social History Main Topics  . Smoking status: Former Smoker    Quit date: 05/17/1974  . Smokeless tobacco: Never Used  . Alcohol use 4.2 oz/week    7 Standard drinks or equivalent per week     Comment: one glass of wine with dinner/fim  . Drug use: No  . Sexual activity: Not on file   Other Topics Concern  . Not on file   Social History Narrative  . No narrative on file     PHYSICAL EXAM  Vitals:   05/20/16 1309  BP: 120/78  Pulse: 72  Resp: 18  Weight: 240 lb (108.9 kg)  Height: 5\' 5"  (1.651 m)    Body mass index is 39.94 kg/m.   General: The patient is well-developed and well-nourished and in no acute distress  Neck:  The neck is mildly tender over the lower left paraspinal muscles and trapezius muscles  Musculoskeletal:   The shoulder are moderately tender over the subacromial bursae.  ROM is reduced in left shoulder. She is also tender trochanteric bursa on the left.     She also has mild lumbar paraspinal and piriformis tenderness, left > right  Neurologic Exam  Mental status: The patient is alert and oriented x 3 at the time of the examination. The patient has apparent normal recent and remote memory, with an apparently normal attention span and concentration ability.    Cranial nerves:     Trapezius and sternocleidomastoid strength is normal. No dysarthria is noted.    No obvious hearing deficits are noted.  Motor:  Muscle bulk and tone are normal. Strength is  5 / 5 in all 4 extremities.   Sensory: Sensory testing is intact to soft touch sensationin all 4 extremities.  Gait and station: Station and gait are normal. Tandem gait is normal.      DIAGNOSTIC DATA (LABS, IMAGING, TESTING) - I reviewed patient records, labs, notes, testing and  imaging myself where available.     ASSESSMENT AND PLAN   Subacromial bursitis - Plan: DG Shoulder Right  Trochanteric bursitis of left hip - Plan: DG HIP UNILAT WITH PELVIS 2-3 VIEWS LEFT  Neck pain  Migraine without aura and without status migrainosus, not intractable  Back pain, lumbosacral - Plan: DG HIP UNILAT WITH PELVIS 2-3 VIEWS LEFT, oxyCODONE-acetaminophen (PERCOCET) 10-325 MG tablet   1.  Toradol 60 mg IM 2.   X-ray left hip and right shoulder --- consider ortho referral based on results.  3.   Refill percocet.   Start etodolac.    4.   She will return to see me in about 4 months, sooner if she has new or worsening neurologic symptoms.  Richard A. Felecia Shelling, MD, PhD [id number], [id number] PM Certified in Neurology, Clinical Neurophysiology, Sleep Medicine, Pain Medicine and Neuroimaging  [facility] [address], [place], [place] [address] [phone]

## 2016-05-21 ENCOUNTER — Telehealth: Payer: Self-pay | Admitting: *Deleted

## 2016-05-21 NOTE — Telephone Encounter (Signed)
I have spoken with Kimberly Mann this afternoon, and per RAS, explained that x-rays show some arthritis in her hip, none in shoulders; if pain does not improve, can offer referral to ortho.  She verbalized understanding of same/fim

## 2016-05-21 NOTE — Telephone Encounter (Signed)
-----   Message from Britt Bottom, MD sent at 05/20/2016  7:31 PM EST ----- Please let her know the xrays do show some hip arthritis but no shoulder arthritis .   If pain does not improve, we can send to ortho

## 2016-06-25 ENCOUNTER — Other Ambulatory Visit: Payer: Self-pay | Admitting: Neurology

## 2016-06-25 ENCOUNTER — Telehealth: Payer: Self-pay | Admitting: Neurology

## 2016-06-25 DIAGNOSIS — M545 Low back pain, unspecified: Secondary | ICD-10-CM

## 2016-06-25 MED ORDER — OXYCODONE-ACETAMINOPHEN 10-325 MG PO TABS: 1.0000 | ORAL_TABLET | Freq: Four times a day (QID) | ORAL | 0 refills | Status: DC | PRN

## 2016-06-25 NOTE — Telephone Encounter (Signed)
Patient requesting refill of oxyCODONE-acetaminophen (PERCOCET) 10-325 MG tablet. ° ° °

## 2016-06-25 NOTE — Telephone Encounter (Signed)
Rx. awaiting RAS sig/fim 

## 2016-06-25 NOTE — Telephone Encounter (Signed)
Rx. up front [facility]/fim 

## 2016-06-25 NOTE — Addendum Note (Signed)
Addended by: France Ravens I on: 06/25/2016 11:09 AM   Modules accepted: Orders

## 2016-07-22 ENCOUNTER — Telehealth: Payer: Self-pay | Admitting: Neurology

## 2016-07-22 DIAGNOSIS — M545 Low back pain, unspecified: Secondary | ICD-10-CM

## 2016-07-22 MED ORDER — OXYCODONE-ACETAMINOPHEN 10-325 MG PO TABS: 1.0000 | ORAL_TABLET | Freq: Four times a day (QID) | ORAL | 0 refills | Status: DC | PRN

## 2016-07-22 NOTE — Telephone Encounter (Signed)
Rx. up front [facility]/fim 

## 2016-07-22 NOTE — Telephone Encounter (Signed)
Patient requesting refill of oxyCODONE-acetaminophen (PERCOCET) 10-325 MG tablet. ° ° °

## 2016-07-22 NOTE — Telephone Encounter (Signed)
Rx. awaiting RAS sig/fim 

## 2016-07-22 NOTE — Addendum Note (Signed)
Addended by: France Ravens I on: 07/22/2016 11:23 AM   Modules accepted: Orders

## 2016-08-21 ENCOUNTER — Telehealth: Payer: Self-pay | Admitting: Neurology

## 2016-08-21 DIAGNOSIS — M545 Low back pain, unspecified: Secondary | ICD-10-CM

## 2016-08-21 MED ORDER — OXYCODONE-ACETAMINOPHEN 10-325 MG PO TABS: 1.0000 | ORAL_TABLET | Freq: Four times a day (QID) | ORAL | 0 refills | Status: DC | PRN

## 2016-08-21 NOTE — Telephone Encounter (Signed)
Rx. awaiting YY sig, as RAS is ooo this wk/fim

## 2016-08-21 NOTE — Telephone Encounter (Signed)
Pt calling for refill of oxyCODONE-acetaminophen (PERCOCET) 10-325 MG tablet

## 2016-08-21 NOTE — Addendum Note (Signed)
Addended by: France Ravens I on: 08/21/2016 04:06 PM   Modules accepted: Orders

## 2016-08-22 NOTE — Telephone Encounter (Signed)
Rx. up front [facility]/fim 

## 2016-09-18 ENCOUNTER — Ambulatory Visit (INDEPENDENT_AMBULATORY_CARE_PROVIDER_SITE_OTHER): Payer: Medicare HMO | Admitting: Neurology

## 2016-09-18 ENCOUNTER — Encounter (INDEPENDENT_AMBULATORY_CARE_PROVIDER_SITE_OTHER): Payer: Self-pay

## 2016-09-18 ENCOUNTER — Encounter: Payer: Self-pay | Admitting: Neurology

## 2016-09-18 VITALS — BP 139/99 | HR 89 | Resp 18 | Ht 65.0 in | Wt 240.0 lb

## 2016-09-18 DIAGNOSIS — M7061 Trochanteric bursitis, right hip: Secondary | ICD-10-CM | POA: Diagnosis not present

## 2016-09-18 DIAGNOSIS — M7550 Bursitis of unspecified shoulder: Secondary | ICD-10-CM

## 2016-09-18 DIAGNOSIS — H9203 Otalgia, bilateral: Secondary | ICD-10-CM | POA: Insufficient documentation

## 2016-09-18 DIAGNOSIS — M7062 Trochanteric bursitis, left hip: Secondary | ICD-10-CM

## 2016-09-18 DIAGNOSIS — M542 Cervicalgia: Secondary | ICD-10-CM

## 2016-09-18 DIAGNOSIS — M545 Low back pain, unspecified: Secondary | ICD-10-CM

## 2016-09-18 DIAGNOSIS — G43009 Migraine without aura, not intractable, without status migrainosus: Secondary | ICD-10-CM

## 2016-09-18 MED ORDER — LIDOCAINE 5 % EX OINT: 1.0000 "application " | TOPICAL_OINTMENT | Freq: Two times a day (BID) | CUTANEOUS | 3 refills | Status: DC | PRN

## 2016-09-18 MED ORDER — OXYCODONE-ACETAMINOPHEN 10-325 MG PO TABS: 1.0000 | ORAL_TABLET | Freq: Four times a day (QID) | ORAL | 0 refills | Status: DC | PRN

## 2016-09-18 MED ORDER — FLUCONAZOLE 150 MG PO TABS: 150.0000 mg | ORAL_TABLET | Freq: Every day | ORAL | 0 refills | Status: DC

## 2016-09-18 MED ORDER — MELOXICAM 15 MG PO TABS: 15.0000 mg | ORAL_TABLET | Freq: Every day | ORAL | 5 refills | Status: DC

## 2016-09-18 MED ORDER — HYDROXYZINE PAMOATE 25 MG PO CAPS: 25.0000 mg | ORAL_CAPSULE | Freq: Three times a day (TID) | ORAL | 0 refills | Status: DC | PRN

## 2016-09-18 NOTE — Progress Notes (Signed)
t  [facility]  PATIENT: Kimberly Mann DOB: 1955/06/16  REFERRING CLINICIAN: [name]  HISTORY FROM: Patient  REASON FOR VISIT: Left shoulder and neck pain; back pain [  HISTORICAL  CHIEF COMPLAINT:  Chief Complaint  Patient presents with  . Back Pain    Sts. she is having more bilat hip and shoulder pain.  Would like tpi's today if appropriate/fim  . Shoulder Pain    HISTORY OF PRESENT ILLNESS:  Kimberly Mann is a 61 yo woman with left shoulder pain, neck pain and left lower back/buttock pain.     LBP/hip pain:   She is having bilateral lower back pain and right > left buttock and right > left hip pain.    Pain does not radiate into her legs.   In the past, trochanteric bursa injection has helped.   Shoulders:   She had more pain in the upper arm/shoulder region, left = right.   Her pain increases with external rotation and elevation.   In the past, she had a benefit form subacromial bursa injection. However, she has had some itching following the steroid shots and it was especially bad after the last one 3-4 months ago.    ROM is mildly reduced in shoulders.    No arm weakness.  Neck pain:   She has neck pain that is worse with movements.  Pain is more on the left.   No numbness or weakness..    Med's:  For her pain she is on several medications.  Muscle relaxants have helped her pain some.   She tolerates tizanidine well. Spasms worse at night.  Oxycodone has helped      Ear ache:   She is having pain in both ears.   Sometimes that triggers a migraine.  Migraine headaches are doing worse and she has about 1/week now.    Percocet helps to knock them out.       REVIEW OF SYSTEMS:  Constitutional: No fevers, chills, sweats, or change in appetite Eyes: No visual changes, double vision, eye pain Ear, nose and throat: No hearing loss, ear pain, nasal congestion, sore throat Cardiovascular: No chest pain, palpitations Respiratory:  No shortness of  breath at rest or with exertion.   No wheezes GastrointestinaI: No nausea, vomiting, diarrhea, abdominal pain, fecal incontinence Genitourinary:  No dysuria, urinary retention or frequency.  No nocturia. Musculoskeletal:  see above Integumentary: No rash, pruritus, skin lesions Neurological: as above Psychiatric: No depression at this time.  No anxiety Endocrine: No palpitations, diaphoresis, change in appetite, change in weigh or increased thirst  ALLERGIES: Allergies  Allergen Reactions  . Penicillins   . Sulfa Antibiotics     HOME MEDICATIONS: Outpatient Medications Prior to Visit  Medication Sig Dispense Refill  . amLODipine (NORVASC) 5 MG tablet Take 5 mg by mouth daily.      . cetirizine-pseudoephedrine (ZYRTEC-D) 5-120 MG tablet Take 1 tablet by mouth 2 (two) times daily. 30 tablet 0  . diclofenac sodium (VOLTAREN) 1 % GEL Apply 2 g topically 4 (four) times daily. 100 g 2  . fluticasone (FLONASE) 50 MCG/ACT nasal spray USE ONE SPRAY(S) IN EACH NOSTRIL ONCE DAILY 1 g 1  . hydrochlorothiazide 25 MG tablet Take 25 mg by mouth daily.      Marland Kitchen ibuprofen (ADVIL,MOTRIN) 800 MG tablet Take 1 tablet (800 mg total) by mouth 3 (three) times daily. 21 tablet 0  . imipramine (TOFRANIL) 25 MG tablet Take one or two pills at bedtime 60  tablet 11  . loratadine (CLARITIN) 10 MG tablet     . LORazepam (ATIVAN) 0.5 MG tablet Take 2 tablets (1 mg total) by mouth 3 (three) times daily as needed (for dizziness). 6 tablet 0  . Multiple Vitamins-Minerals (MULTIVITAMIN ADULT PO)     . promethazine (PHENERGAN) 25 MG tablet     . tiZANidine (ZANAFLEX) 4 MG tablet One pill in am, one pill po mid-day and one or two po at bedtime 120 tablet 11  . TYPHIM VI 25 MCG/0.5ML injection Reported on 05/17/2015  0  . etodolac (LODINE) 400 MG tablet Take 1 tablet (400 mg total) by mouth 2 (two) times daily. 60 tablet 5  . fluconazole (DIFLUCAN) 150 MG tablet Take 1 tablet (150 mg total) by mouth daily. 15 tablet 0  .  hydrOXYzine (VISTARIL) 25 MG capsule Take 1 capsule (25 mg total) by mouth 3 (three) times daily as needed. 60 capsule 0  . lidocaine (XYLOCAINE) 5 % ointment Apply 1 application topically 2 (two) times daily as needed. Apply one inch bid as needed 35.44 g 1  . oxyCODONE-acetaminophen (PERCOCET) 10-325 MG tablet Take 1 tablet by mouth every 6 (six) hours as needed for pain. 120 tablet 0   No facility-administered medications prior to visit.     PAST MEDICAL HISTORY: Past Medical History:  Diagnosis Date  . Hypertension   . Vertigo     PAST SURGICAL HISTORY: Past Surgical History:  Procedure Laterality Date  . LIPOSUCTION    . TUBAL LIGATION      FAMILY HISTORY: Family History  Problem Relation Age of Onset  . Hypertension Mother     SOCIAL HISTORY:  Social History   Social History  . Marital status: Legally Separated    Spouse name: N/A  . Number of children: N/A  . Years of education: N/A   Occupational History  . Not on file.   Social History Main Topics  . Smoking status: Former Smoker    Quit date: 05/17/1974  . Smokeless tobacco: Never Used  . Alcohol use 4.2 oz/week    7 Standard drinks or equivalent per week     Comment: one glass of wine with dinner/fim  . Drug use: No  . Sexual activity: Not on file   Other Topics Concern  . Not on file   Social History Narrative  . No narrative on file     PHYSICAL EXAM  Vitals:   09/18/16 1300  BP: (!) 139/99  Pulse: 89  Resp: 18  Weight: 240 lb (108.9 kg)  Height: 5\' 5"  (1.651 m)    Body mass index is 39.94 kg/m.   General: The patient is well-developed and well-nourished and in no acute distress  HEENT:   Tympanic membranes are intact. Her pharynx is nonerythematous.  Neck:  The neck is mildly tender over the lower left paraspinal muscles and trapezius muscles  Musculoskeletal:   There is tenderness over the subacromial bursae bilaterally. Range of motion is reduced in the left shoulder more  than the right. There is tenderness over the trochanteric bursae, left greater than right. There is also milder tenderness over the piriformis muscles and the lumbar paraspinal muscles.     Neurologic Exam  Mental status: The patient is alert and oriented x 3 at the time of the examination. The patient has apparent normal recent and remote memory, with an apparently normal attention span and concentration ability.    Cranial nerves:    Trapezius and sternocleidomastoid strength  is normal. No dysarthria is noted.    No obvious hearing deficits are noted.  Motor:  Muscle bulk and tone are normal. Strength is 5/5 in the arms and legs.  Sensory: Sensory testing is intact to soft touch sensationin all 4 extremities.  Gait and station: Station and gait are normal. Tandem gait is normal.   DTRs:   Reflexes are normal and symmetric in the arms and legs.     DIAGNOSTIC DATA (LABS, IMAGING, TESTING) - I reviewed patient records, labs, notes, testing and imaging myself where available.     ASSESSMENT AND PLAN   Subacromial bursitis  Back pain, lumbosacral - Plan: oxyCODONE-acetaminophen (PERCOCET) 10-325 MG tablet  Trochanteric bursitis, right hip  Neck pain  Migraine without aura and without status migrainosus, not intractable  Trochanteric bursitis of left hip  Ear pain, bilateral   1.  Inject bilateral trochanteric bursae and bilateral subacromial bursae with a total of 80 mg Depo-Medrol in Marcaine using sterile technique    She tolerated the procedure well 2.   Refill percocet.   Change etodolac to meloxicam 3.   She will return to see me in about 4 months, sooner if she has new or worsening neurologic symptoms.  Ching Rabideau A. Felecia Shelling, MD, PhD 4/69/6295, 2:84 PM Certified in Neurology, Clinical Neurophysiology, Sleep Medicine, Pain Medicine and Neuroimaging  [facility] [address], [place], [place] [address] [phone]

## 2016-10-03 ENCOUNTER — Telehealth: Payer: Self-pay | Admitting: *Deleted

## 2016-10-03 MED ORDER — DICLOFENAC SODIUM 1 % TD GEL: 2.0000 g | Freq: Four times a day (QID) | TRANSDERMAL | 2 refills | Status: DC

## 2016-10-03 NOTE — Telephone Encounter (Signed)
Voltaren gel escribed to [facility] per faxed request/fim

## 2016-10-08 ENCOUNTER — Telehealth: Payer: Self-pay | Admitting: Neurology

## 2016-10-08 DIAGNOSIS — G43909 Migraine, unspecified, not intractable, without status migrainosus: Secondary | ICD-10-CM

## 2016-10-08 DIAGNOSIS — H9203 Otalgia, bilateral: Secondary | ICD-10-CM

## 2016-10-08 NOTE — Telephone Encounter (Signed)
Pt calling asking RN Faith to send a referral to Dr Gerarda Fraction (ENT)in [place]@ [phone], please contact pt on mobile#

## 2016-10-08 NOTE — Telephone Encounter (Signed)
Referral to Dr. Jenell Milliner ordered in EPIC/fim

## 2016-10-08 NOTE — Addendum Note (Signed)
Addended by: France Ravens I on: 10/08/2016 02:39 PM   Modules accepted: Orders

## 2016-10-21 ENCOUNTER — Telehealth: Payer: Self-pay | Admitting: Neurology

## 2016-10-21 DIAGNOSIS — M545 Low back pain, unspecified: Secondary | ICD-10-CM

## 2016-10-21 MED ORDER — OXYCODONE-ACETAMINOPHEN 10-325 MG PO TABS: 1.0000 | ORAL_TABLET | Freq: Four times a day (QID) | ORAL | 0 refills | Status: DC | PRN

## 2016-10-21 NOTE — Telephone Encounter (Signed)
Rx. awaiting RAS sig/fim 

## 2016-10-21 NOTE — Telephone Encounter (Signed)
Rx. up front [facility]/fim 

## 2016-10-21 NOTE — Addendum Note (Signed)
Addended by: France Ravens I on: 10/21/2016 03:35 PM   Modules accepted: Orders

## 2016-10-21 NOTE — Telephone Encounter (Signed)
Patient called requesting refill for oxyCODONE-acetaminophen (PERCOCET) 10-325 MG tablet

## 2016-11-18 ENCOUNTER — Telehealth: Payer: Self-pay | Admitting: Neurology

## 2016-11-18 DIAGNOSIS — M545 Low back pain, unspecified: Secondary | ICD-10-CM

## 2016-11-18 MED ORDER — OXYCODONE-ACETAMINOPHEN 10-325 MG PO TABS: 1.0000 | ORAL_TABLET | Freq: Four times a day (QID) | ORAL | 0 refills | Status: DC | PRN

## 2016-11-18 NOTE — Telephone Encounter (Signed)
Pt calling for refill of oxyCODONE-acetaminophen (PERCOCET) 10-325 MG tablet

## 2016-11-18 NOTE — Telephone Encounter (Signed)
Rx. awaiting RAS sig/fim 

## 2016-11-18 NOTE — Telephone Encounter (Signed)
Rx. up front [facility]/fim 

## 2016-11-18 NOTE — Addendum Note (Signed)
Addended by: France Ravens I on: 11/18/2016 09:36 AM   Modules accepted: Orders

## 2016-12-06 ENCOUNTER — Other Ambulatory Visit: Payer: Self-pay | Admitting: Neurology

## 2016-12-17 ENCOUNTER — Encounter (HOSPITAL_BASED_OUTPATIENT_CLINIC_OR_DEPARTMENT_OTHER): Payer: Self-pay

## 2016-12-17 ENCOUNTER — Emergency Department (HOSPITAL_BASED_OUTPATIENT_CLINIC_OR_DEPARTMENT_OTHER)
Admission: EM | Admit: 2016-12-17 | Discharge: 2016-12-17 | Disposition: A | Discharge status: Home / Self Care | Payer: Medicare HMO | Attending: Emergency Medicine | Admitting: Emergency Medicine

## 2016-12-17 DIAGNOSIS — R21 Rash and other nonspecific skin eruption: Secondary | ICD-10-CM | POA: Insufficient documentation

## 2016-12-17 DIAGNOSIS — Z87891 Personal history of nicotine dependence: Secondary | ICD-10-CM | POA: Diagnosis not present

## 2016-12-17 DIAGNOSIS — Z79899 Other long term (current) drug therapy: Secondary | ICD-10-CM | POA: Insufficient documentation

## 2016-12-17 DIAGNOSIS — I10 Essential (primary) hypertension: Secondary | ICD-10-CM | POA: Insufficient documentation

## 2016-12-17 DIAGNOSIS — Z853 Personal history of malignant neoplasm of breast: Secondary | ICD-10-CM | POA: Insufficient documentation

## 2016-12-17 HISTORY — DX: Malignant (primary) neoplasm, unspecified: C80.1

## 2016-12-17 MED ORDER — DOXYCYCLINE HYCLATE 100 MG PO CAPS: 100.0000 mg | ORAL_CAPSULE | Freq: Two times a day (BID) | ORAL | 0 refills | Status: AC

## 2016-12-17 MED ORDER — HYDROXYZINE HCL 25 MG PO TABS: 25.0000 mg | ORAL_TABLET | Freq: Four times a day (QID) | ORAL | 0 refills | Status: DC

## 2016-12-17 MED ORDER — TRIAMCINOLONE ACETONIDE 0.5 % EX OINT: 1.0000 "application " | TOPICAL_OINTMENT | Freq: Two times a day (BID) | CUTANEOUS | 0 refills | Status: DC

## 2016-12-17 NOTE — Discharge Instructions (Signed)
Please take all of your antibiotics until finished!   You may develop abdominal discomfort or diarrhea from the antibiotic.  You may help offset this with probiotics which you can buy or get in yogurt. Do not eat  or take the probiotics until 2 hours after your antibiotic.   Apply steroid cream to the affected area twice daily. Apply ice packs or heat packs to the area for comfort, which ever feels best.keep the area clean and dry. Make sure you wash your sheets in hot water. Follow-up with your primary care physician for reevaluation.return to the ED if any concerning signs and symptoms develop such as fevers, worsening redness/tenderness, or abnormal drainage.

## 2016-12-17 NOTE — ED Provider Notes (Signed)
[facility] Provider Note   CSN: [id number] Arrival date & time: 12/17/16  1318     History   Chief Complaint Chief Complaint  Patient presents with  . Rash    HPI Kimberly Mann is a 61 y.o. female with history of breast cancer, HTN who presents today with chief complaint acute onset, progressively worsening rash to the left arm near the elbow. She states that on Sunday 4 days ago she noted an area of redness which has since spread to 4 lesions. She endorses itching, denies fevers, numbness, tingling, or weakness. No new soaps, shampoos, lotions, or detergents. No observed insect bites. No one at home has a similar rash. She states that she recently underwent a procedure to remove breast cancer and states the IV was placed in her right hand. He has tried topical corticosteroid cream and topical Benadryl without significant relief.  The history is provided by the patient.    Past Medical History:  Diagnosis Date  . Cancer (HCC)    breast  . Hypertension   . Vertigo     Patient Active Problem List   Diagnosis Date Noted  . Trochanteric bursitis, right hip 09/18/2016  . Ear pain, bilateral 09/18/2016  . Back pain, lumbosacral 05/20/2016  . Bilateral shoulder pain 09/13/2015  . Trochanteric bursitis of left hip 05/17/2015  . Bilateral sciatica 05/17/2015  . Subacromial bursitis 12/13/2014  . Osteoarthritis of left shoulder 05/17/2014  . Neck pain 05/17/2014  . Lower back pain 05/17/2014  . Migraine 05/17/2014    Past Surgical History:  Procedure Laterality Date  . BREAST SURGERY    . LIPOSUCTION    . TUBAL LIGATION      OB History    No data available       Home Medications    Prior to Admission medications   Medication Sig Start Date End Date Taking? Authorizing Provider  amLODipine (NORVASC) 5 MG tablet Take 5 mg by mouth daily.      [provider]  cetirizine-pseudoephedrine (ZYRTEC-D) 5-120 MG tablet Take 1 tablet by mouth 2  (two) times daily. 10/07/15   Law, Bea Graff, PA-C  diclofenac sodium (VOLTAREN) 1 % GEL Apply 2 g topically 4 (four) times daily. 10/03/16   Sater, Nanine Means, MD  doxycycline (VIBRAMYCIN) 100 MG capsule Take 1 capsule (100 mg total) by mouth 2 (two) times daily. 12/17/16 12/24/16  Rodell Perna A, PA-C  fluconazole (DIFLUCAN) 150 MG tablet Take 1 tablet (150 mg total) by mouth daily. 09/18/16   Sater, Nanine Means, MD  fluticasone Asencion Islam) 50 MCG/ACT nasal spray USE ONE SPRAY(S) IN EACH NOSTRIL ONCE DAILY 06/26/16   Sater, Nanine Means, MD  hydrochlorothiazide 25 MG tablet Take 25 mg by mouth daily.      [provider]  hydrOXYzine (ATARAX/VISTARIL) 25 MG tablet Take 1 tablet (25 mg total) by mouth every 6 (six) hours. 12/17/16   Nils Flack, Adama Ferber A, PA-C  hydrOXYzine (VISTARIL) 25 MG capsule Take 1 capsule (25 mg total) by mouth 3 (three) times daily as needed. 09/18/16   Sater, Nanine Means, MD  ibuprofen (ADVIL,MOTRIN) 800 MG tablet Take 1 tablet (800 mg total) by mouth 3 (three) times daily. 10/07/15   Law, Bea Graff, PA-C  imipramine (TOFRANIL) 25 MG tablet Take one or two pills at bedtime 05/17/14   Sater, Nanine Means, MD  lidocaine (XYLOCAINE) 5 % ointment Apply 1 application topically 2 (two) times daily as needed. Apply one inch bid as needed 09/18/16  Sater, Nanine Means, MD  loratadine (CLARITIN) 10 MG tablet  03/05/15   [provider]  LORazepam (ATIVAN) 0.5 MG tablet Take 2 tablets (1 mg total) by mouth 3 (three) times daily as needed (for dizziness). 10/07/15   Law, Bea Graff, PA-C  meloxicam (MOBIC) 15 MG tablet Take 1 tablet (15 mg total) by mouth daily. 09/18/16   Sater, Nanine Means, MD  Multiple Vitamins-Minerals (MULTIVITAMIN ADULT PO)  03/29/15   [provider]  oxyCODONE-acetaminophen (PERCOCET) 10-325 MG tablet Take 1 tablet by mouth every 6 (six) hours as needed for pain. 11/18/16   Sater, Nanine Means, MD  promethazine (PHENERGAN) 25 MG tablet  03/05/15   [provider]    tiZANidine (ZANAFLEX) 4 MG tablet One pill in am, one pill po mid-day and one or two po at bedtime 09/13/15   Sater, Nanine Means, MD  triamcinolone ointment (KENALOG) 0.5 % Apply 1 application topically 2 (two) times daily. 12/17/16   Rodell Perna A, PA-C  TYPHIM VI 25 MCG/0.5ML injection Reported on 05/17/2015 09/22/14   [provider]    Family History Family History  Problem Relation Age of Onset  . Hypertension Mother     Social History Social History  Substance Use Topics  . Smoking status: Former Smoker    Quit date: 05/17/1974  . Smokeless tobacco: Never Used  . Alcohol use 4.2 oz/week    7 Standard drinks or equivalent per week     Comment: one glass of wine with dinner/fim     Allergies   Morphine and related; Penicillins; and Sulfa antibiotics   Review of Systems Review of Systems  Constitutional: Negative for fever.  Musculoskeletal: Negative for arthralgias and myalgias.  Skin: Positive for rash.  Neurological: Negative for weakness and numbness.  All other systems reviewed and are negative.    Physical Exam Updated Vital Signs BP (!) 165/112   Pulse 91   Temp 98.1 F (36.7 C) (Oral)   Resp 18   Ht 5\' 5"  (1.651 m)   Wt 106.1 kg (234 lb)   SpO2 100%   BMI 38.94 kg/m   Physical Exam  Constitutional: She appears well-developed and well-nourished. No distress.  Resting comfortably in chair, itching left arm  HENT:  Head: Normocephalic and atraumatic.  Eyes: Conjunctivae are normal. Right eye exhibits no discharge. Left eye exhibits no discharge.  Neck: Normal range of motion. Neck supple. No JVD present. No tracheal deviation present.  Cardiovascular: Normal rate and intact distal pulses.   2+ radial pulses bilaterally  Pulmonary/Chest: Effort normal.  Abdominal: She exhibits no distension.  Musculoskeletal: Normal range of motion. She exhibits no edema.  Normal range of motion of left upper extremity. Normal strength of left upper extremity.  No deformity, crepitus, or swelling noted  Neurological: She is alert.  Fluent speech, no facial droop, sensation intact to soft touch of bilateral upper extremity  Skin: Skin is warm and dry. Rash noted. There is erythema.  Four 1cm indurated raised erythematous nodules localized to the dorsum of the left elbow overlying the lateral epicondyle and down to the proximal forearm. Mildly tender to palpation. Excoriations noted. No fluctuance or drainage. There is surrounding erythema. No streaking.   Psychiatric: She has a normal mood and affect. Her behavior is normal.  Nursing note and vitals reviewed.    ED Treatments / Results  Labs (all labs ordered are listed, but only abnormal results are displayed) Labs Reviewed - No data to display  EKG  EKG Interpretation None       Radiology No results found.  Procedures Procedures (including critical care time)  Medications Ordered in ED Medications - No data to display   Initial Impression / Assessment and Plan / ED Course  I have reviewed the triage vital signs and the nursing notes.  Pertinent labs & imaging results that were available during my care of the patient were reviewed by me and considered in my medical decision making (see chart for details).    Rash consistent with insect bite/bed bugs. Patient denies any difficulty breathing or swallowing.  Pt has a patent airway without stridor and is handling secretions without difficulty; no angioedema. No blisters, no pustules, no warmth, no draining sinus tracts, no superficial abscesses, no bullous impetigo, no vesicles, no desquamation, no target lesions with dusky purpura or a central bulla. No concern for SJS, TEN, TSS, tick borne illness, syphilis or other life-threatening condition. There is concern for superimposed infection with surrounding erythema. Will discharge with 7 day course of doxycycline, topical steroid cream and Atarax for itching. Discussed indications for  return to the ED. She will follow-up with primary care physician for reevaluation this week. Pt verbalized understanding of and agreement with plan and is safe for discharge home at this time.   Final Clinical Impressions(s) / ED Diagnoses   Final diagnoses:  Rash    New Prescriptions Discharge Medication List as of 12/17/2016  3:07 PM    START taking these medications   Details  doxycycline (VIBRAMYCIN) 100 MG capsule Take 1 capsule (100 mg total) by mouth 2 (two) times daily., Starting Tue 12/17/2016, Until Tue 12/24/2016, Print    hydrOXYzine (ATARAX/VISTARIL) 25 MG tablet Take 1 tablet (25 mg total) by mouth every 6 (six) hours., Starting Tue 12/17/2016, Print    triamcinolone ointment (KENALOG) 0.5 % Apply 1 application topically 2 (two) times daily., Starting Tue 12/17/2016, Print         Nils Flack, Lillian A, PA-C 12/17/16 1614    Duffy Bruce, MD 12/18/16 [phone]

## 2016-12-17 NOTE — ED Triage Notes (Signed)
Pt has pruritic rash on L arm that started Sunday.

## 2016-12-25 ENCOUNTER — Telehealth: Payer: Self-pay | Admitting: Neurology

## 2016-12-25 ENCOUNTER — Other Ambulatory Visit: Payer: Self-pay | Admitting: *Deleted

## 2016-12-25 DIAGNOSIS — M545 Low back pain, unspecified: Secondary | ICD-10-CM

## 2016-12-25 DIAGNOSIS — G894 Chronic pain syndrome: Secondary | ICD-10-CM

## 2016-12-25 DIAGNOSIS — Z79899 Other long term (current) drug therapy: Secondary | ICD-10-CM

## 2016-12-25 MED ORDER — OXYCODONE-ACETAMINOPHEN 10-325 MG PO TABS: 1.0000 | ORAL_TABLET | Freq: Four times a day (QID) | ORAL | 0 refills | Status: DC | PRN

## 2016-12-25 NOTE — Telephone Encounter (Signed)
Pt has called for a refill of oxyCODONE-acetaminophen (PERCOCET) 10-325 MG tablet

## 2016-12-25 NOTE — Telephone Encounter (Signed)
Rx. awaiting RAS sig/fim 

## 2016-12-25 NOTE — Addendum Note (Signed)
Addended by: France Ravens I on: 12/25/2016 09:07 AM   Modules accepted: Orders

## 2016-12-25 NOTE — Telephone Encounter (Signed)
Rx. up front [facility]/fim 

## 2016-12-27 ENCOUNTER — Other Ambulatory Visit (INDEPENDENT_AMBULATORY_CARE_PROVIDER_SITE_OTHER): Payer: Self-pay

## 2016-12-27 DIAGNOSIS — Z0289 Encounter for other administrative examinations: Secondary | ICD-10-CM

## 2016-12-27 DIAGNOSIS — G894 Chronic pain syndrome: Secondary | ICD-10-CM

## 2016-12-27 DIAGNOSIS — Z79899 Other long term (current) drug therapy: Secondary | ICD-10-CM

## 2016-12-28 LAB — MED LIST OPTION NOT SELECTED

## 2017-01-01 LAB — DRUG SCREEN, UR (12+OXYCODONE+CRT)
AMPHETAMINE SCREEN URINE: NEGATIVE ng/mL
BARBITURATE SCREEN URINE: NEGATIVE ng/mL
BENZODIAZEPINE SCREEN, URINE: NEGATIVE ng/mL
CANNABINOIDS UR QL SCN: NEGATIVE ng/mL
COCAINE(METAB.)SCREEN, URINE: NEGATIVE ng/mL
Creatinine(Crt), U: 23 mg/dL (ref 20.0–300.0)
FENTANYL, URINE: NEGATIVE pg/mL
MEPERIDINE SCREEN, URINE: NEGATIVE ng/mL
Methadone Screen, Urine: NEGATIVE ng/mL
OPIATE SCREEN URINE: NEGATIVE ng/mL
OXYCODONE+OXYMORPHONE UR QL SCN: POSITIVE — AB
PROPOXYPHENE SCREEN URINE: NEGATIVE ng/mL
Ph of Urine: 6.5 (ref 4.5–8.9)
Phencyclidine Qn, Ur: NEGATIVE ng/mL
SPECIFIC GRAVITY: 1.009
Tramadol Screen, Urine: NEGATIVE ng/mL

## 2017-01-20 ENCOUNTER — Encounter: Payer: Self-pay | Admitting: Neurology

## 2017-01-20 ENCOUNTER — Ambulatory Visit (INDEPENDENT_AMBULATORY_CARE_PROVIDER_SITE_OTHER): Payer: Medicare HMO | Admitting: Neurology

## 2017-01-20 VITALS — BP 179/105 | HR 76 | Resp 20 | Ht 65.0 in | Wt 234.0 lb

## 2017-01-20 DIAGNOSIS — M5431 Sciatica, right side: Secondary | ICD-10-CM | POA: Diagnosis not present

## 2017-01-20 DIAGNOSIS — G43009 Migraine without aura, not intractable, without status migrainosus: Secondary | ICD-10-CM | POA: Diagnosis not present

## 2017-01-20 DIAGNOSIS — M7550 Bursitis of unspecified shoulder: Secondary | ICD-10-CM | POA: Diagnosis not present

## 2017-01-20 DIAGNOSIS — M545 Low back pain, unspecified: Secondary | ICD-10-CM

## 2017-01-20 DIAGNOSIS — M5432 Sciatica, left side: Secondary | ICD-10-CM | POA: Diagnosis not present

## 2017-01-20 DIAGNOSIS — M7061 Trochanteric bursitis, right hip: Secondary | ICD-10-CM | POA: Diagnosis not present

## 2017-01-20 DIAGNOSIS — M7062 Trochanteric bursitis, left hip: Secondary | ICD-10-CM | POA: Diagnosis not present

## 2017-01-20 MED ORDER — LIDOCAINE 5 % EX OINT: 1.0000 "application " | TOPICAL_OINTMENT | Freq: Two times a day (BID) | CUTANEOUS | 3 refills | Status: DC | PRN

## 2017-01-20 MED ORDER — OXYCODONE-ACETAMINOPHEN 10-325 MG PO TABS: 1.0000 | ORAL_TABLET | Freq: Four times a day (QID) | ORAL | 0 refills | Status: DC | PRN

## 2017-01-20 MED ORDER — FLUCONAZOLE 150 MG PO TABS: 150.0000 mg | ORAL_TABLET | Freq: Every day | ORAL | 0 refills | Status: DC

## 2017-01-20 MED ORDER — MELOXICAM 15 MG PO TABS: 15.0000 mg | ORAL_TABLET | Freq: Every day | ORAL | 5 refills | Status: DC

## 2017-01-20 NOTE — Progress Notes (Signed)
t  [facility]  PATIENT: Kimberly Mann DOB: Mar 19, 1956  REFERRING CLINICIAN: [name]  HISTORY FROM: Patient  REASON FOR VISIT: Left shoulder and neck pain; back pain [  HISTORICAL  CHIEF COMPLAINT:  Chief Complaint  Patient presents with  . Back Pain    Today c/o left >right shoulder, hip pain, lbp worse.  Since last ov, has been dx. with right sided breast cancer.  Undergoing radiation therapy.  Did not have to have a mastectomy--lumpectomy  with wider excision was done.  Does not need tpi's./fim    HISTORY OF PRESENT ILLNESS:  Kimberly Mann is a 61 yo woman.   Currently, her worse pain is in the right neck and in the right hip and lower back region appear                LBP/hip pain:   She reports bilateral lower back pain and pain that is in the buttock and hip region, generally worse on the right.   Pain does not radiate into her legs. Pain is worse with walking and with standing up from a seated position.  In the past, trochanteric bursa injection has helped.   Neck/Shoulder pain:   She reports pain in the neck and shoulders, worse on her right in the neck but in the left > right shulder.    Pain is worse with arms elevated,    Her pain also increases with external rotation and elevation.  Subacromial bursa injection often helped in the past.   However, she has had some itching following the steroid shots and it was especially bad after the last one 3-4 months ago.    ROM is mildly reduced in shoulders.    No arm weakness.   Breast cancer:   She needed surgery x 2 and is currently on RadRx for breast cancer.       Med's:  For her pain she is on Percocet 4 times a day with benefit.  Sheis also on meloxicam and tizanidine.   She has not shown any drug-seeking behavior       Migraine headaches occur about once a week. If Percocet does not help the migraine she will take a meloxicam.    REVIEW OF SYSTEMS:  Constitutional: No fevers, chills, sweats, or  change in appetite Eyes: No visual changes, double vision, eye pain Ear, nose and throat: No hearing loss, ear pain, nasal congestion, sore throat Cardiovascular: No chest pain, palpitations Respiratory:  No shortness of breath at rest or with exertion.   No wheezes GastrointestinaI: No nausea, vomiting, diarrhea, abdominal pain, fecal incontinence Genitourinary:  No dysuria, urinary retention or frequency.  No nocturia. Musculoskeletal:  see above Integumentary: No rash, pruritus, skin lesions Neurological: as above Psychiatric: No depression at this time.  No anxiety Endocrine: No palpitations, diaphoresis, change in appetite, change in weigh or increased thirst  ALLERGIES: Allergies  Allergen Reactions  . Morphine And Related Nausea And Vomiting and Swelling  . Penicillins   . Sulfa Antibiotics     HOME MEDICATIONS: Outpatient Medications Prior to Visit  Medication Sig Dispense Refill  . amLODipine (NORVASC) 5 MG tablet Take 5 mg by mouth daily.      . cetirizine-pseudoephedrine (ZYRTEC-D) 5-120 MG tablet Take 1 tablet by mouth 2 (two) times daily. 30 tablet 0  . diclofenac sodium (VOLTAREN) 1 % GEL Apply 2 g topically 4 (four) times daily. 100 g 2  . fluconazole (DIFLUCAN) 150 MG tablet Take 1 tablet (150 mg  total) by mouth daily. 15 tablet 0  . fluticasone (FLONASE) 50 MCG/ACT nasal spray USE ONE SPRAY(S) IN EACH NOSTRIL ONCE DAILY 1 g 1  . hydrochlorothiazide 25 MG tablet Take 25 mg by mouth daily.      . hydrOXYzine (ATARAX/VISTARIL) 25 MG tablet Take 1 tablet (25 mg total) by mouth every 6 (six) hours. 12 tablet 0  . hydrOXYzine (VISTARIL) 25 MG capsule Take 1 capsule (25 mg total) by mouth 3 (three) times daily as needed. 60 capsule 0  . ibuprofen (ADVIL,MOTRIN) 800 MG tablet Take 1 tablet (800 mg total) by mouth 3 (three) times daily. 21 tablet 0  . imipramine (TOFRANIL) 25 MG tablet Take one or two pills at bedtime 60 tablet 11  . lidocaine (XYLOCAINE) 5 % ointment Apply  1 application topically 2 (two) times daily as needed. Apply one inch bid as needed 35.44 g 3  . loratadine (CLARITIN) 10 MG tablet     . LORazepam (ATIVAN) 0.5 MG tablet Take 2 tablets (1 mg total) by mouth 3 (three) times daily as needed (for dizziness). 6 tablet 0  . meloxicam (MOBIC) 15 MG tablet Take 1 tablet (15 mg total) by mouth daily. 30 tablet 5  . Multiple Vitamins-Minerals (MULTIVITAMIN ADULT PO)     . oxyCODONE-acetaminophen (PERCOCET) 10-325 MG tablet Take 1 tablet by mouth every 6 (six) hours as needed for pain. 120 tablet 0  . promethazine (PHENERGAN) 25 MG tablet     . tiZANidine (ZANAFLEX) 4 MG tablet One pill in am, one pill po mid-day and one or two po at bedtime 120 tablet 11  . triamcinolone ointment (KENALOG) 0.5 % Apply 1 application topically 2 (two) times daily. 30 g 0  . TYPHIM VI 25 MCG/0.5ML injection Reported on 05/17/2015  0   No facility-administered medications prior to visit.     PAST MEDICAL HISTORY: Past Medical History:  Diagnosis Date  . Cancer (HCC)    breast  . Hypertension   . Vertigo     PAST SURGICAL HISTORY: Past Surgical History:  Procedure Laterality Date  . BREAST SURGERY    . LIPOSUCTION    . TUBAL LIGATION      FAMILY HISTORY: Family History  Problem Relation Age of Onset  . Hypertension Mother     SOCIAL HISTORY:  Social History   Social History  . Marital status: Legally Separated    Spouse name: N/A  . Number of children: N/A  . Years of education: N/A   Occupational History  . Not on file.   Social History Main Topics  . Smoking status: Former Smoker    Quit date: 05/17/1974  . Smokeless tobacco: Never Used  . Alcohol use 4.2 oz/week    7 Standard drinks or equivalent per week     Comment: one glass of wine with dinner/fim  . Drug use: No  . Sexual activity: Not on file   Other Topics Concern  . Not on file   Social History Narrative  . No narrative on file     PHYSICAL EXAM  Vitals:   01/20/17  1248  BP: (!) 179/105  Pulse: 76  Resp: 20  Weight: 234 lb (106.1 kg)  Height: 5\' 5"  (1.651 m)    Body mass index is 38.94 kg/m.   General: The patient is well-developed and well-nourished and in no acute distress  Neck:  The neck is mildly tender over the lower left paraspinal muscles and trapezius muscles  Musculoskeletal:  There is tenderness over the subacromial bursae bilaterally. Range of motion is reduced in the left shoulder more than the right. There is tenderness over the trochanteric bursae, left greater than right. There is also milder tenderness over the piriformis muscles and the lumbar paraspinal muscles.     Neurologic Exam  Mental status: The patient is alert and oriented x 3 at the time of the examination. The patient has apparent normal recent and remote memory, with an apparently normal attention span and concentration ability.    Cranial nerves:    Trapezius and sternocleidomastoid strength is normal. No dysarthria is noted.    No obvious hearing deficits are noted.  Motor:  Muscle bulk and tone are normal. Strength is 5/5 in the arms and legs.  Sensory: She has intact sensation to touch and vibration in the arms and legs..  Gait and station: Station is normal. Gait is arthritic Tandem gait is mildly wide.   DTRs:   Reflexes are normal and symmetric in the arms and legs.     DIAGNOSTIC DATA (LABS, IMAGING, TESTING) - I reviewed patient records, labs, notes, testing and imaging myself where available.     ASSESSMENT AND PLAN   Subacromial bursitis  Trochanteric bursitis, right hip  Bilateral sciatica  Migraine without aura and without status migrainosus, not intractable   1.   Inject bilateral trochanteric bursae and bilateral subacromial bursae with a total of 80 mg Depo-Medrol in 8 mL Marcaine.   She tolerated the procedure well 2.   Refill percocet.   Continue other medications. 3.   She will return to see me in about 4 months, sooner if  she has new or worsening neurologic symptoms.  Richard A. Felecia Shelling, MD, PhD 07/20/4008, 2:72 PM Certified in Neurology, Clinical Neurophysiology, Sleep Medicine, Pain Medicine and Neuroimaging  [facility] [address] [place], [place] [address] [phone]

## 2017-01-28 ENCOUNTER — Telehealth: Payer: Self-pay | Admitting: Neurology

## 2017-01-28 NOTE — Telephone Encounter (Signed)
Pt is asking for a call back to know if she can come in for a shot for back pain.  Please call

## 2017-01-28 NOTE — Telephone Encounter (Signed)
Spoke with Jla and gave appt. 10/4 at 1130/fim

## 2017-01-30 ENCOUNTER — Ambulatory Visit (INDEPENDENT_AMBULATORY_CARE_PROVIDER_SITE_OTHER): Payer: Medicare HMO | Admitting: Neurology

## 2017-01-30 ENCOUNTER — Encounter: Payer: Self-pay | Admitting: Neurology

## 2017-01-30 VITALS — BP 140/98 | HR 92 | Resp 20 | Ht 65.0 in | Wt 234.0 lb

## 2017-01-30 DIAGNOSIS — M545 Low back pain: Secondary | ICD-10-CM | POA: Diagnosis not present

## 2017-01-30 DIAGNOSIS — M5431 Sciatica, right side: Secondary | ICD-10-CM | POA: Diagnosis not present

## 2017-01-30 DIAGNOSIS — G8929 Other chronic pain: Secondary | ICD-10-CM | POA: Diagnosis not present

## 2017-01-30 DIAGNOSIS — M542 Cervicalgia: Secondary | ICD-10-CM | POA: Diagnosis not present

## 2017-01-30 DIAGNOSIS — M5432 Sciatica, left side: Secondary | ICD-10-CM | POA: Diagnosis not present

## 2017-01-30 DIAGNOSIS — M7550 Bursitis of unspecified shoulder: Secondary | ICD-10-CM

## 2017-01-30 DIAGNOSIS — M7062 Trochanteric bursitis, left hip: Secondary | ICD-10-CM

## 2017-01-30 NOTE — Progress Notes (Signed)
t  [facility]  PATIENT: Kimberly Mann DOB: 06-28-1955  REFERRING CLINICIAN: [name]  HISTORY FROM: Patient  REASON FOR VISIT: Left shoulder and neck pain; back pain [  HISTORICAL  CHIEF COMPLAINT:  Chief Complaint  Patient presents with  . Back Pain    Requesting tpi for lbp, left side worse than right, occ. radiates down back of left leg to ankle. Believes this is worse with lying on hard table for radiation./fim    HISTORY OF PRESENT ILLNESS:  Kimberly Mann is a 61 yo woman.   Currently, her worse pain is in the right neck and in the right hip and lower back region appear                Update 01/30/2017:    Since the last visit, her neck pain has greatly increased. She is undergoing radiation for breast cancer and she notes that laying down on the table is painful for her.   The pain is in the left neck more than the right. Additionally she is noticing more lower back pain also little worse on the left than the right. She denies any change in strength or sensation in the arms or legs. There is no change in her bladder.    Her hip and shoulder pain improved quite a bit after the joint injections at her last visit. ________________________________ From 01/30/2017 LBP/hip pain:   She reports bilateral lower back pain and pain that is in the buttock and hip region, generally worse on the right.   Pain does not radiate into her legs. Pain is worse with walking and with standing up from a seated position.  In the past, trochanteric bursa injection has helped.   Neck/Shoulder pain:   She reports pain in the neck and shoulders, worse on her right in the neck but in the left > right shulder.    Pain is worse with arms elevated,    Her pain also increases with external rotation and elevation.  Subacromial bursa injection often helped in the past.   However, she has had some itching following the steroid shots and it was especially bad after the last one 3-4 months  ago.    ROM is mildly reduced in shoulders.    No arm weakness.   Breast cancer:   She needed surgery x 2 and is currently on RadRx for breast cancer.       Med's:  For her pain she is on Percocet 4 times a day with benefit.  Sheis also on meloxicam and tizanidine.   She has not shown any drug-seeking behavior       Migraine headaches occur about once a week. If Percocet does not help the migraine she will take a meloxicam.    REVIEW OF SYSTEMS:  Constitutional: No fevers, chills, sweats, or change in appetite Eyes: No visual changes, double vision, eye pain Ear, nose and throat: No hearing loss, ear pain, nasal congestion, sore throat Cardiovascular: No chest pain, palpitations Respiratory:  No shortness of breath at rest or with exertion.   No wheezes GastrointestinaI: No nausea, vomiting, diarrhea, abdominal pain, fecal incontinence Genitourinary:  No dysuria, urinary retention or frequency.  No nocturia. Musculoskeletal:  see above Integumentary: No rash, pruritus, skin lesions Neurological: as above Psychiatric: No depression at this time.  No anxiety Endocrine: No palpitations, diaphoresis, change in appetite, change in weigh or increased thirst  ALLERGIES: Allergies  Allergen Reactions  . Morphine And Related Nausea And Vomiting  and Swelling  . Penicillins   . Sulfa Antibiotics     HOME MEDICATIONS: Outpatient Medications Prior to Visit  Medication Sig Dispense Refill  . amLODipine (NORVASC) 5 MG tablet Take 5 mg by mouth daily.      . cetirizine-pseudoephedrine (ZYRTEC-D) 5-120 MG tablet Take 1 tablet by mouth 2 (two) times daily. 30 tablet 0  . diclofenac sodium (VOLTAREN) 1 % GEL Apply 2 g topically 4 (four) times daily. 100 g 2  . fluconazole (DIFLUCAN) 150 MG tablet Take 1 tablet (150 mg total) by mouth daily. 15 tablet 0  . fluticasone (FLONASE) 50 MCG/ACT nasal spray USE ONE SPRAY(S) IN EACH NOSTRIL ONCE DAILY 1 g 1  . hydrochlorothiazide 25 MG tablet Take 25 mg  by mouth daily.      . hydrOXYzine (ATARAX/VISTARIL) 25 MG tablet Take 1 tablet (25 mg total) by mouth every 6 (six) hours. 12 tablet 0  . hydrOXYzine (VISTARIL) 25 MG capsule Take 1 capsule (25 mg total) by mouth 3 (three) times daily as needed. 60 capsule 0  . ibuprofen (ADVIL,MOTRIN) 800 MG tablet Take 1 tablet (800 mg total) by mouth 3 (three) times daily. 21 tablet 0  . imipramine (TOFRANIL) 25 MG tablet Take one or two pills at bedtime 60 tablet 11  . loratadine (CLARITIN) 10 MG tablet     . LORazepam (ATIVAN) 0.5 MG tablet Take 2 tablets (1 mg total) by mouth 3 (three) times daily as needed (for dizziness). 6 tablet 0  . meloxicam (MOBIC) 15 MG tablet Take 1 tablet (15 mg total) by mouth daily. 30 tablet 5  . Multiple Vitamins-Minerals (MULTIVITAMIN ADULT PO)     . oxyCODONE-acetaminophen (PERCOCET) 10-325 MG tablet Take 1 tablet by mouth every 6 (six) hours as needed for pain. 120 tablet 0  . promethazine (PHENERGAN) 25 MG tablet     . tiZANidine (ZANAFLEX) 4 MG tablet One pill in am, one pill po mid-day and one or two po at bedtime 120 tablet 11  . triamcinolone ointment (KENALOG) 0.5 % Apply 1 application topically 2 (two) times daily. 30 g 0  . TYPHIM VI 25 MCG/0.5ML injection Reported on 05/17/2015  0  . lidocaine (XYLOCAINE) 5 % ointment Apply 1 application topically 2 (two) times daily as needed. Apply one inch bid as needed 35.44 g 3   No facility-administered medications prior to visit.     PAST MEDICAL HISTORY: Past Medical History:  Diagnosis Date  . Cancer (HCC)    breast  . Hypertension   . Vertigo     PAST SURGICAL HISTORY: Past Surgical History:  Procedure Laterality Date  . BREAST SURGERY    . LIPOSUCTION    . TUBAL LIGATION      FAMILY HISTORY: Family History  Problem Relation Age of Onset  . Hypertension Mother     SOCIAL HISTORY:  Social History   Social History  . Marital status: Legally Separated    Spouse name: N/A  . Number of children:  N/A  . Years of education: N/A   Occupational History  . Not on file.   Social History Main Topics  . Smoking status: Former Smoker    Quit date: 05/17/1974  . Smokeless tobacco: Never Used  . Alcohol use 4.2 oz/week    7 Standard drinks or equivalent per week     Comment: one glass of wine with dinner/fim  . Drug use: No  . Sexual activity: Not on file   Other Topics Concern  .  Not on file   Social History Narrative  . No narrative on file     PHYSICAL EXAM  Vitals:   01/30/17 1138  BP: (!) 140/98  Pulse: 92  Resp: 20  Weight: 234 lb (106.1 kg)  Height: 5\' 5"  (1.651 m)    Body mass index is 38.94 kg/m.   General: The patient is well-developed and well-nourished and in no acute distress   Musculoskeletal:   She has tenderness in the neck, left greater than right especially over the trapezius and supraspinatus muscles. There is mild tenderness in the cervical paraspinal muscles. The shoulders are now nontender and range of motion is reasonably good in the shoulders. She also has pain in the lower lumbar paraspinal muscles. The trochanteric bursa pain is better   Neurologic Exam  Mental status: The patient is alert and oriented x 3 at the time of the examination. The patient has apparent normal recent and remote memory, with an apparently normal attention span and concentration ability.    Cranial nerves:    Trapezius and sternocleidomastoid strength is normal. No dysarthria is noted.    No obvious hearing deficits are noted.  Motor:  Muscle bulk and tone are normal. Strength is 5/5 in the arms and legs.  Sensory: She has intact sensation to touch and vibration in the arms and legs..  Gait and station: Station is normal. Gait is arthritic Tandem gait is mildly wide.   DTRs:   Reflexes are normal and symmetric in the arms and legs.     DIAGNOSTIC DATA (LABS, IMAGING, TESTING) - I reviewed patient records, labs, notes, testing and imaging myself where  available.     ASSESSMENT AND PLAN   Neck pain  Chronic midline low back pain without sciatica  Subacromial bursitis  Trochanteric bursitis of left hip  Bilateral sciatica   1.   Trigger point injection of the left trapezius, left supraspinatus and bilateral L4 and L5 lumbar paraspinal muscles with 40 mg Depo-Medrol in a total of 6 mL Marcaine. She tolerated the injection well and there were no complications. 2.   Continue other medications. 3.   If her pain does not improve, we will need to check an MRI of the spine to make sure that there is no breast cancer metastsis.  4.   She will return to see me in about 3 months, sooner if she has new or worsening neurologic symptoms.  We discussed that she needs to wait 3 months any other steroid injection.  Richard A. Felecia Shelling, MD, PhD 50/12/3265, 1:24 PM Certified in Neurology, Clinical Neurophysiology, Sleep Medicine, Pain Medicine and Neuroimaging  [facility] [address] [place], [place] [address] [phone]

## 2017-02-24 ENCOUNTER — Telehealth: Payer: Self-pay | Admitting: Neurology

## 2017-02-24 DIAGNOSIS — M545 Low back pain, unspecified: Secondary | ICD-10-CM

## 2017-02-24 MED ORDER — OXYCODONE-ACETAMINOPHEN 10-325 MG PO TABS
1.0000 | ORAL_TABLET | Freq: Four times a day (QID) | ORAL | 0 refills | Status: DC | PRN
Start: 2017-02-24 — End: 2017-03-25

## 2017-02-24 NOTE — Telephone Encounter (Signed)
Rx. awaiting RAS sig/fim 

## 2017-02-24 NOTE — Telephone Encounter (Signed)
Rx. up front [facility]/fim 

## 2017-02-24 NOTE — Addendum Note (Signed)
Addended by: France Ravens I on: 02/24/2017 08:38 AM   Modules accepted: Orders

## 2017-02-24 NOTE — Telephone Encounter (Signed)
Patient requesting refill of oxyCODONE-acetaminophen (PERCOCET) 10-325 MG tablet. ° ° °

## 2017-03-25 ENCOUNTER — Telehealth: Payer: Self-pay | Admitting: Neurology

## 2017-03-25 DIAGNOSIS — M545 Low back pain, unspecified: Secondary | ICD-10-CM

## 2017-03-25 MED ORDER — OXYCODONE-ACETAMINOPHEN 10-325 MG PO TABS
1.0000 | ORAL_TABLET | Freq: Four times a day (QID) | ORAL | 0 refills | Status: DC | PRN
Start: 2017-03-25 — End: 2017-04-23

## 2017-03-25 NOTE — Telephone Encounter (Signed)
Rx. up front [facility]/fim 

## 2017-03-25 NOTE — Telephone Encounter (Signed)
Pt calling for a refill of oxyCODONE-acetaminophen (PERCOCET) 10-325 MG tablet

## 2017-03-25 NOTE — Telephone Encounter (Signed)
Rx. awaiting RAS sig/fim 

## 2017-03-25 NOTE — Addendum Note (Signed)
Addended by: France Ravens I on: 03/25/2017 09:03 AM   Modules accepted: Orders

## 2017-04-23 ENCOUNTER — Telehealth: Payer: Self-pay | Admitting: Neurology

## 2017-04-23 DIAGNOSIS — M545 Low back pain, unspecified: Secondary | ICD-10-CM

## 2017-04-23 MED ORDER — OXYCODONE-ACETAMINOPHEN 10-325 MG PO TABS: 1.0000 | ORAL_TABLET | Freq: Four times a day (QID) | ORAL | 0 refills | Status: DC | PRN

## 2017-04-23 NOTE — Telephone Encounter (Signed)
Pt is asking for refill oxyCODONE-acetaminophen (PERCOCET) 10-325 MG tablet

## 2017-04-23 NOTE — Telephone Encounter (Signed)
The prescription for oxycodone will be sent in. 

## 2017-04-24 ENCOUNTER — Other Ambulatory Visit: Payer: Self-pay | Admitting: Neurology

## 2017-04-24 NOTE — Telephone Encounter (Signed)
Rx signed by Dr. Jannifer Franklin and placed up front for pick up.

## 2017-05-22 ENCOUNTER — Ambulatory Visit: Payer: Medicare HMO | Admitting: Neurology

## 2017-05-29 ENCOUNTER — Telehealth: Payer: Self-pay | Admitting: Neurology

## 2017-05-29 DIAGNOSIS — M545 Low back pain, unspecified: Secondary | ICD-10-CM

## 2017-05-29 MED ORDER — OXYCODONE-ACETAMINOPHEN 10-325 MG PO TABS: 1.0000 | ORAL_TABLET | Freq: Four times a day (QID) | ORAL | 0 refills | Status: DC | PRN

## 2017-05-29 NOTE — Telephone Encounter (Signed)
Pt request refill for oxyCODONE-acetaminophen (PERCOCET) 10-325 MG tablet. Pt is aware the clinic closes at noon tomorrow

## 2017-05-29 NOTE — Telephone Encounter (Signed)
Rx printed, awaiting sig

## 2017-05-30 NOTE — Telephone Encounter (Signed)
RX at the front, pt will pick up this morning.

## 2017-06-02 ENCOUNTER — Ambulatory Visit: Payer: Medicare HMO | Admitting: Neurology

## 2017-06-04 ENCOUNTER — Encounter: Payer: Self-pay | Admitting: Neurology

## 2017-06-04 ENCOUNTER — Ambulatory Visit (INDEPENDENT_AMBULATORY_CARE_PROVIDER_SITE_OTHER): Payer: Medicare HMO | Admitting: Neurology

## 2017-06-04 VITALS — BP 161/97 | HR 85 | Resp 18 | Ht 65.0 in | Wt 236.5 lb

## 2017-06-04 DIAGNOSIS — M7062 Trochanteric bursitis, left hip: Secondary | ICD-10-CM

## 2017-06-04 DIAGNOSIS — M7061 Trochanteric bursitis, right hip: Secondary | ICD-10-CM | POA: Diagnosis not present

## 2017-06-04 DIAGNOSIS — G8929 Other chronic pain: Secondary | ICD-10-CM

## 2017-06-04 DIAGNOSIS — M545 Low back pain, unspecified: Secondary | ICD-10-CM

## 2017-06-04 DIAGNOSIS — M542 Cervicalgia: Secondary | ICD-10-CM

## 2017-06-04 DIAGNOSIS — M25512 Pain in left shoulder: Secondary | ICD-10-CM

## 2017-06-04 DIAGNOSIS — M25511 Pain in right shoulder: Secondary | ICD-10-CM

## 2017-06-04 MED ORDER — FLUCONAZOLE 150 MG PO TABS: 150.0000 mg | ORAL_TABLET | Freq: Every day | ORAL | 0 refills | Status: DC

## 2017-06-04 MED ORDER — HYDROXYZINE PAMOATE 25 MG PO CAPS: 25.0000 mg | ORAL_CAPSULE | Freq: Three times a day (TID) | ORAL | 0 refills | Status: DC | PRN

## 2017-06-04 MED ORDER — OXYCODONE-ACETAMINOPHEN 10-325 MG PO TABS: 1.0000 | ORAL_TABLET | Freq: Four times a day (QID) | ORAL | 0 refills | Status: DC | PRN

## 2017-06-04 NOTE — Progress Notes (Signed)
t  [facility]  PATIENT: Kimberly Mann DOB: 03-25-1956  REFERRING CLINICIAN: [name]  HISTORY FROM: Patient  REASON FOR VISIT: Left shoulder and neck pain; back pain [  HISTORICAL  CHIEF COMPLAINT:  Chief Complaint  Patient presents with  . Neck Pain    Sts. neck, left shoulder pain is some worse; requests tpi today if appropriate/fim  . Back Pain  . Shoulder Pain    HISTORY OF PRESENT ILLNESS:  Kimberly Mann is a 62 yo woman.   Currently, her worse pain is in the right neck and in the right hip and lower back region.    Update 06/04/2017: Neck, back and shoulder pain improved after the trigger point injections in October but have returned over the last month.      MRI cervical spine showed DJD at C3C4-C5C6.    Her worse pain is in the left shoulder.   Pain increases if she lays on that side.    She also has right shoulder region pain.   She also has neck pain that increases as the day goes on.   Her LB is hurting but not as bad as the neck and shoulders.   Hipas are hurting a lot over the bursae.    Bursa injections have helped in the past.    She sees Dr. Harlow Asa for breast cancer.   She had surgery and radiation and will be starting a pill x 5 days  Update 01/30/2017:    Since the last visit, her neck pain has greatly increased. She is undergoing radiation for breast cancer and she notes that laying down on the table is painful for her.   The pain is in the left neck more than the right. Additionally she is noticing more lower back pain also little worse on the left than the right. She denies any change in strength or sensation in the arms or legs. There is no change in her bladder.    Her hip and shoulder pain improved quite a bit after the joint injections at her last visit. ________________________________ From 01/30/2017 LBP/hip pain:   She reports bilateral lower back pain and pain that is in the buttock and hip region, generally worse on the right.    Pain does not radiate into her legs. Pain is worse with walking and with standing up from a seated position.  In the past, trochanteric bursa injection has helped.   Neck/Shoulder pain:   She reports pain in the neck and shoulders, worse on her right in the neck but in the left > right shulder.    Pain is worse with arms elevated,    Her pain also increases with external rotation and elevation.  Subacromial bursa injection often helped in the past.   However, she has had some itching following the steroid shots and it was especially bad after the last one 3-4 months ago.    ROM is mildly reduced in shoulders.    No arm weakness.   Breast cancer:   She needed surgery x 2 and is currently on RadRx for breast cancer.       Med's:  For her pain she is on Percocet 4 times a day with benefit.  Sheis also on meloxicam and tizanidine.   She has not shown any drug-seeking behavior       Migraine headaches occur about once a week. If Percocet does not help the migraine she will take a meloxicam.    REVIEW OF SYSTEMS:  Constitutional: No fevers, chills, sweats, or change in appetite Eyes: No visual changes, double vision, eye pain Ear, nose and throat: No hearing loss, ear pain, nasal congestion, sore throat Cardiovascular: No chest pain, palpitations Respiratory:  No shortness of breath at rest or with exertion.   No wheezes GastrointestinaI: No nausea, vomiting, diarrhea, abdominal pain, fecal incontinence Genitourinary:  No dysuria, urinary retention or frequency.  No nocturia. Musculoskeletal:  see above Integumentary: No rash, pruritus, skin lesions Neurological: as above Psychiatric: No depression at this time.  No anxiety Endocrine: No palpitations, diaphoresis, change in appetite, change in weigh or increased thirst  ALLERGIES: Allergies  Allergen Reactions  . Morphine And Related Nausea And Vomiting and Swelling  . Penicillins   . Sulfa Antibiotics     HOME MEDICATIONS: Outpatient  Medications Prior to Visit  Medication Sig Dispense Refill  . amLODipine (NORVASC) 5 MG tablet Take 5 mg by mouth daily.      . fluticasone (FLONASE) 50 MCG/ACT nasal spray USE 1 SPRAY(S) IN EACH NOSTRIL ONCE DAILY 3 g 0  . hydrochlorothiazide 25 MG tablet Take 25 mg by mouth daily.      Marland Kitchen ibuprofen (ADVIL,MOTRIN) 800 MG tablet Take 1 tablet (800 mg total) by mouth 3 (three) times daily. 21 tablet 0  . loratadine (CLARITIN) 10 MG tablet     . LORazepam (ATIVAN) 0.5 MG tablet Take 2 tablets (1 mg total) by mouth 3 (three) times daily as needed (for dizziness). 6 tablet 0  . meloxicam (MOBIC) 15 MG tablet Take 1 tablet (15 mg total) by mouth daily. 30 tablet 5  . Multiple Vitamins-Minerals (MULTIVITAMIN ADULT PO)     . tiZANidine (ZANAFLEX) 4 MG tablet One pill in am, one pill po mid-day and one or two po at bedtime 120 tablet 11  . triamcinolone ointment (KENALOG) 0.5 % Apply 1 application topically 2 (two) times daily. 30 g 0  . TYPHIM VI 25 MCG/0.5ML injection Reported on 05/17/2015  0  . VOLTAREN 1 % GEL APPLY 2 GRAMS TOPICALLY 4 TIMES DAILY 300 g 0  . fluconazole (DIFLUCAN) 150 MG tablet Take 1 tablet (150 mg total) by mouth daily. 15 tablet 0  . hydrOXYzine (VISTARIL) 25 MG capsule Take 1 capsule (25 mg total) by mouth 3 (three) times daily as needed. 60 capsule 0  . oxyCODONE-acetaminophen (PERCOCET) 10-325 MG tablet Take 1 tablet by mouth every 6 (six) hours as needed for pain. 120 tablet 0  . cetirizine-pseudoephedrine (ZYRTEC-D) 5-120 MG tablet Take 1 tablet by mouth 2 (two) times daily. 30 tablet 0  . hydrOXYzine (ATARAX/VISTARIL) 25 MG tablet Take 1 tablet (25 mg total) by mouth every 6 (six) hours. (Patient not taking: Reported on 06/04/2017) 12 tablet 0  . imipramine (TOFRANIL) 25 MG tablet Take one or two pills at bedtime 60 tablet 11  . promethazine (PHENERGAN) 25 MG tablet      No facility-administered medications prior to visit.     PAST MEDICAL HISTORY: Past Medical History:    Diagnosis Date  . Cancer (HCC)    breast  . Hypertension   . Vertigo     PAST SURGICAL HISTORY: Past Surgical History:  Procedure Laterality Date  . BREAST SURGERY    . LIPOSUCTION    . TUBAL LIGATION      FAMILY HISTORY: Family History  Problem Relation Age of Onset  . Hypertension Mother     SOCIAL HISTORY:  Social History   Socioeconomic History  . Marital status: Legally  Separated    Spouse name: Not on file  . Number of children: Not on file  . Years of education: Not on file  . Highest education level: Not on file  Social Needs  . Financial resource strain: Not on file  . Food insecurity - worry: Not on file  . Food insecurity - inability: Not on file  . Transportation needs - medical: Not on file  . Transportation needs - non-medical: Not on file  Occupational History  . Not on file  Tobacco Use  . Smoking status: Former Smoker    Last attempt to quit: 05/17/1974    Years since quitting: 43.0  . Smokeless tobacco: Never Used  Substance and Sexual Activity  . Alcohol use: Yes    Alcohol/week: 4.2 oz    Types: 7 Standard drinks or equivalent per week    Comment: one glass of wine with dinner/fim  . Drug use: No  . Sexual activity: Not on file  Other Topics Concern  . Not on file  Social History Narrative  . Not on file     PHYSICAL EXAM  Vitals:   06/04/17 1331  BP: (!) 161/97  Pulse: 85  Resp: 18  Weight: 236 lb 8 oz (107.3 kg)  Height: 5\' 5"  (1.651 m)    Body mass index is 39.36 kg/m.   General: The patient is well-developed and well-nourished and in no acute distress   Musculoskeletal:   There is tenderness in the paraspinal muscles of the neck, worse around C6-C7 and C7-T1. There is also tenderness in the trapezius muscles. The shoulders are tender over the subacromial bursae bilaterally. Raising her arm and externally rotating increases pain. She is tender over the trochanteric bursae of her hips. Faber signs are negative.       Neurologic Exam  Mental status: The patient is alert and oriented x 3 at the time of the examination. The patient has apparent normal recent and remote memory, with an apparently normal attention span and concentration ability.    Cranial nerves:    Trapezius and sternocleidomastoid strength is normal. No dysarthria is noted.    No obvious hearing deficits are noted.  Motor:  Muscle bulk and tone are normal. Strength is 5/5 in the arms or legs.  Sensory: She has intact sensation to touch and vibration in the arms and legs..  Gait and station: Station is normal. Gait is arthritic Tandem gait is mildly wide.   DTRs:   Reflexes are normal and symmetric in the arms and legs.     DIAGNOSTIC DATA (LABS, IMAGING, TESTING) - I reviewed patient records, labs, notes, testing and imaging myself where available.     ASSESSMENT AND PLAN   Neck pain  Back pain, lumbosacral - Plan: oxyCODONE-acetaminophen (PERCOCET) 10-325 MG tablet  Chronic midline low back pain without sciatica  Chronic pain of both shoulders  Trochanteric bursitis of left hip  Trochanteric bursitis, right hip   1.   Trigger point injection of the C7T1 paraspinal and trapezius muscles with 20 mg Depo-Medrol in a total of 3 mL Marcaine.    Inject bilateral subacromial bursae with 30 mg Depomedrol in Marcaine.  Inject bilateral trochanteric bursae with 30 mg Depo-Medrol in Marcaine. She tolerated the injections well and there were no complications. 2.   Renew Percocet.  Continue other medications. 3.   Try to stay active and exercises tolerated. 4.    She often gets a yeast infection and also sometimes will have generalized itching  after her injection. I will call in some hydroxyzine and Diflucan.  5.    She will return to see me in about 3 months, sooner if she has new or worsening neurologic symptoms.  We discussed that she needs to wait 3 months any other steroid injection.  Rick Carruthers A. Felecia Shelling, MD, PhD 5/0/1586,  8:25 PM Certified in Neurology, Clinical Neurophysiology, Sleep Medicine, Pain Medicine and Neuroimaging  [facility] [address] [place], [place] [address] [phone]

## 2017-06-16 ENCOUNTER — Ambulatory Visit (INDEPENDENT_AMBULATORY_CARE_PROVIDER_SITE_OTHER): Payer: Medicare HMO | Admitting: Family Medicine

## 2017-06-16 ENCOUNTER — Encounter: Payer: Self-pay | Admitting: Family Medicine

## 2017-06-16 VITALS — BP 118/78 | HR 92 | Temp 98.1°F | Ht 65.0 in | Wt 236.0 lb

## 2017-06-16 DIAGNOSIS — R42 Dizziness and giddiness: Secondary | ICD-10-CM

## 2017-06-16 DIAGNOSIS — I10 Essential (primary) hypertension: Secondary | ICD-10-CM | POA: Diagnosis not present

## 2017-06-16 DIAGNOSIS — R0789 Other chest pain: Secondary | ICD-10-CM

## 2017-06-16 DIAGNOSIS — J302 Other seasonal allergic rhinitis: Secondary | ICD-10-CM | POA: Diagnosis not present

## 2017-06-16 MED ORDER — FLUTICASONE PROPIONATE 50 MCG/ACT NA SUSP: 2.0000 | Freq: Every day | NASAL | 1 refills | Status: DC

## 2017-06-16 NOTE — Progress Notes (Signed)
Pre visit review using our clinic review tool, if applicable. No additional management support is needed unless otherwise documented below in the visit note. 

## 2017-06-16 NOTE — Patient Instructions (Addendum)
How to Perform the Epley Maneuver The Epley maneuver is an exercise that relieves symptoms of vertigo. Vertigo is the feeling that you or your surroundings are moving when they are not. When you feel vertigo, you may feel like the room is spinning and have trouble walking. Dizziness is a little different than vertigo. When you are dizzy, you may feel unsteady or light-headed. You can do this maneuver at home whenever you have symptoms of vertigo. You can do it up to 3 times a day until your symptoms go away. Even though the Epley maneuver may relieve your vertigo for a few weeks, it is possible that your symptoms will return. This maneuver relieves vertigo, but it does not relieve dizziness. What are the risks? If it is done correctly, the Epley maneuver is considered safe. Sometimes it can lead to dizziness or nausea that goes away after a short time. If you develop other symptoms, such as changes in vision, weakness, or numbness, stop doing the maneuver and call your health care provider. How to perform the Epley maneuver 1. Sit on the edge of a bed or table with your back straight and your legs extended or hanging over the edge of the bed or table. 2. Turn your head halfway toward the affected ear or side. 3. Lie backward quickly with your head turned until you are lying flat on your back. You may want to position a pillow under your shoulders. 4. Hold this position for 30 seconds. You may experience an attack of vertigo. This is normal. 5. Turn your head to the opposite direction until your unaffected ear is facing the floor. 6. Hold this position for 30 seconds. You may experience an attack of vertigo. This is normal. Hold this position until the vertigo stops. 7. Turn your whole body to the same side as your head. Hold for another 30 seconds. 8. Sit back up. You can repeat this exercise up to 3 times a day. Follow these instructions at home:  After doing the Epley maneuver, you can return to  your normal activities.  Ask your health care provider if there is anything you should do at home to prevent vertigo. He or she may recommend that you: ? Keep your head raised (elevated) with two or more pillows while you sleep. ? Do not sleep on the side of your affected ear. ? Get up slowly from bed. ? Avoid sudden movements during the day. ? Avoid extreme head movement, like looking up or bending over. Contact a health care provider if:  Your vertigo gets worse.  You have other symptoms, including: ? Nausea. ? Vomiting. ? Headache. Get help right away if:  You have vision changes.  You have a severe or worsening headache or neck pain.  You cannot stop vomiting.  You have new numbness or weakness in any part of your body. Summary  Vertigo is the feeling that you or your surroundings are moving when they are not.  The Epley maneuver is an exercise that relieves symptoms of vertigo.  If the Epley maneuver is done correctly, it is considered safe. You can do it up to 3 times a day. This information is not intended to replace advice given to you by your health care provider. Make sure you discuss any questions you have with your health care provider. Document Released: 04/20/2013 Document Revised: 03/05/2016 Document Reviewed: 03/05/2016 Elsevier Interactive Patient Education  2017 [place] out YouTube for demonstration videos.  Check blood pressures  at home. If 140 or higher on the top or 90 or higher on the bottom, I would like to see you.  I think your chest pain is related to muscles and bones rather than your heart or lungs. This is good news. If you ever start having pain related to physically exerting yourself, let me know.   Let us know if you need anything.

## 2017-06-16 NOTE — Progress Notes (Signed)
Chief Complaint  Patient presents with  . Establish Care       New Patient Visit SUBJECTIVE: HPI: Kimberly Mann is an 62 y.o.female who is being seen for establishing care.  L ear pain and dizziness for a week. Has happened in past and was dx'd w bppv. Epley Maneuver does not sound familiar. She has been using Flonase as of today that did help a little. She has been using meclizine without improvement. No weakness, numbness, or tingling  Chest pain Duration of issue: 2 weeks Quality: dull Palliation: None, resolves on its own Provocation: deeper breaths Severity: 1/10 Radiation: None Duration of chest pain: several seconds Associated symptoms:  Cardiac history: HTN Family heart history: Mom w high BP Smoker? No   Has hx of allergies and wishes for allergy testing. Would like to have the skin testing.   Allergies  Allergen Reactions  . Morphine And Related Nausea And Vomiting and Swelling  . Penicillins   . Sulfa Antibiotics     Past Medical History:  Diagnosis Date  . Cancer (HCC)    breast  . Hypertension   . Vertigo    Past Surgical History:  Procedure Laterality Date  . BREAST SURGERY    . LIPOSUCTION    . TUBAL LIGATION     Social History   Socioeconomic History  . Marital status: Legally Separated  Tobacco Use  . Smoking status: Former Smoker    Last attempt to quit: 05/17/1974    Years since quitting: 43.1  . Smokeless tobacco: Never Used  Substance and Sexual Activity  . Alcohol use: Yes    Alcohol/week: 4.2 oz    Types: 7 Standard drinks or equivalent per week    Comment: one glass of wine with dinner/fim  . Drug use: No   Family History  Problem Relation Age of Onset  . Hypertension Mother     Current Outpatient Medications:  .  amLODipine (NORVASC) 5 MG tablet, Take 5 mg by mouth daily.  , Disp: , Rfl:  .  cetirizine-pseudoephedrine (ZYRTEC-D) 5-120 MG tablet, Take 1 tablet by mouth 2 (two) times daily., Disp: 30 tablet, Rfl: 0 .   fluconazole (DIFLUCAN) 150 MG tablet, Take 1 tablet (150 mg total) by mouth daily., Disp: 15 tablet, Rfl: 0 .  fluticasone (FLONASE) 50 MCG/ACT nasal spray, Place 2 sprays into both nostrils daily., Disp: 48 g, Rfl: 1 .  hydrochlorothiazide 25 MG tablet, Take 25 mg by mouth daily.  , Disp: , Rfl:  .  hydrOXYzine (VISTARIL) 25 MG capsule, Take 1 capsule (25 mg total) by mouth 3 (three) times daily as needed., Disp: 60 capsule, Rfl: 0 .  loratadine (CLARITIN) 10 MG tablet, , Disp: , Rfl:  .  meloxicam (MOBIC) 15 MG tablet, Take 1 tablet (15 mg total) by mouth daily., Disp: 30 tablet, Rfl: 5 .  Multiple Vitamins-Minerals (MULTIVITAMIN ADULT PO), , Disp: , Rfl:  .  oxyCODONE-acetaminophen (PERCOCET) 10-325 MG tablet, Take 1 tablet by mouth every 6 (six) hours as needed for pain., Disp: 120 tablet, Rfl: 0 .  promethazine (PHENERGAN) 25 MG tablet, , Disp: , Rfl:  .  VOLTAREN 1 % GEL, APPLY 2 GRAMS TOPICALLY 4 TIMES DAILY, Disp: 300 g, Rfl: 0 .  imipramine (TOFRANIL) 25 MG tablet, Take one or two pills at bedtime (Patient not taking: Reported on 06/16/2017), Disp: 60 tablet, Rfl: 11 .  triamcinolone ointment (KENALOG) 0.5 %, Apply 1 application topically 2 (two) times daily. (Patient not taking: Reported on  06/16/2017), Disp: 30 g, Rfl: 0  No LMP recorded. Patient is postmenopausal.  ROS Cardiovascular: Denies chest pain  Respiratory: Denies dyspnea   OBJECTIVE: BP 118/78 (BP Location: Left Arm, Patient Position: Sitting, Cuff Size: Large)   Pulse 92   Temp 98.1 F (36.7 C) (Oral)   Ht 5\' 5"  (1.651 m)   Wt 236 lb (107 kg)   SpO2 97%   BMI 39.27 kg/m   Constitutional: -  VS reviewed -  Well developed, well nourished, appears stated age -  No apparent distress  Psychiatric: -  Oriented to person, place, and time -  Memory intact -  Affect and mood normal -  Fluent conversation, good eye contact -  Judgment and insight age appropriate  Eye: -  Conjunctivae clear, no discharge -  Pupils  symmetric, round, reactive to light  ENMT: -  MMM    Pharynx moist, no exudate, no erythema  Neck: -  No gross swelling, no palpable masses -  Thyroid midline, not enlarged, mobile, no palpable masses  Cardiovascular: -  RRR -  No LE edema  Respiratory: -  Normal respiratory effort, no accessory muscle use, no retraction -  Breath sounds equal, no wheezes, no ronchi, no crackles  Neurological:  -  CN II - XII grossly intact -  DTR's equal and symmetric -  DHP positive on R -  Sensation grossly intact to light touch, equal bilaterally  Musculoskeletal: -  No clubbing, no cyanosis -  Gait normal -  5/5 strength throughout -  +TTP over chest wall anteriorly over sternum at approx r 4  Skin: -  No significant lesion on inspection -  Warm and dry to palpation   ASSESSMENT/PLAN: Vertigo  Seasonal allergies - Plan: Ambulatory referral to Allergy  Essential hypertension  Chest wall pain  Patient instructed to sign release of records form from her previous PCP. Epley maneuver rec'd.  Refer to allergy team. INCS. ETD could be contributing. OK to stay off of meds, pt request we keep her current meds on list. She will monitor at home. Chest pain does not sound cardiac. Will re-evaluate once she stops coughing.  Patient should return . The patient voiced understanding and agreement to the plan.   [place], DO 06/16/17  3:47 PM

## 2017-06-19 NOTE — Telephone Encounter (Signed)
Opened in Error.

## 2017-07-07 ENCOUNTER — Ambulatory Visit (INDEPENDENT_AMBULATORY_CARE_PROVIDER_SITE_OTHER): Payer: Medicare HMO | Admitting: Family Medicine

## 2017-07-07 ENCOUNTER — Encounter: Payer: Self-pay | Admitting: Family Medicine

## 2017-07-07 ENCOUNTER — Ambulatory Visit (HOSPITAL_BASED_OUTPATIENT_CLINIC_OR_DEPARTMENT_OTHER)
Admission: RE | Admit: 2017-07-07 | Discharge: 2017-07-07 | Disposition: A | Discharge status: Home / Self Care | Payer: Medicare HMO | Source: Ambulatory Visit | Attending: Family Medicine | Admitting: Family Medicine

## 2017-07-07 VITALS — BP 124/86 | HR 81 | Temp 97.3°F | Ht 65.0 in | Wt 238.2 lb

## 2017-07-07 DIAGNOSIS — E2839 Other primary ovarian failure: Secondary | ICD-10-CM

## 2017-07-07 DIAGNOSIS — M25572 Pain in left ankle and joints of left foot: Secondary | ICD-10-CM | POA: Diagnosis present

## 2017-07-07 MED ORDER — TRIAMTERENE-HCTZ 37.5-25 MG PO TABS: 1.0000 | ORAL_TABLET | Freq: Every day | ORAL | 1 refills | Status: DC

## 2017-07-07 NOTE — Progress Notes (Signed)
Pre visit review using our clinic review tool, if applicable. No additional management support is needed unless otherwise documented below in the visit note. 

## 2017-07-07 NOTE — Patient Instructions (Addendum)
Ice/cold pack over area for 10-15 min twice daily.  Send me a MyChart message in the next week if this is not better.  Ibuprofen 400-600 mg (2-3 over the counter strength tabs) every 6 hours as needed for pain.  OK to take Tylenol 1000 mg (2 extra strength tabs) or 975 mg (3 regular strength tabs) every 6 hours as needed.  Ankle Exercises It is normal to feel mild stretching, pulling, tightness, or discomfort as you do these exercises, but you should stop right away if you feel sudden pain or your pain gets worse.  Stretching and range of motion exercises These exercises warm up your muscles and joints and improve the movement and flexibility of your ankle. These exercises also help to relieve pain, numbness, and tingling. Exercise A: Dorsiflexion/Plantar Flexion   1. Sit with your affected knee straight or bent. Do not rest your foot on anything. 2. Flex your affected ankle to tilt the top of your foot toward your shin. 3. Hold this position for 5 seconds. 4. Point your toes downward to tilt the top of your foot away from your shin. 5. Hold this position for 5 seconds. Repeat 2 times. Complete this exercise 3 times per week. Exercise B: Ankle Alphabet   1. Sit with your affected foot supported at your lower leg. ? Do not rest your foot on anything. ? Make sure your foot has room to move freely. 2. Think of your affected foot as a paintbrush, and move your foot to trace each letter of the alphabet in the air. Keep your hip and knee still while you trace. Make the letters as large as you can without increasing any discomfort. 3. Trace every letter from A to Z. Repeat 2 times. Complete this exercise 3 times per week. Strengthening exercises These exercises build strength and endurance in your ankle. Endurance is the ability to use your muscles for a long time, even after they get tired. Exercise D: Dorsiflexors   1. Secure a rubber exercise band or tube to an object, such as a  table leg, that will stay still when the band is pulled. Secure the other end around your affected foot. 2. Sit on the floor, facing the object with your affected leg extended. The band or tube should be slightly tense when your foot is relaxed. 3. Slowly flex your affected ankle and toes to bring your foot toward you. 4. Hold this position for 3 seconds.  5. Slowly return your foot to the starting position, controlling the band as you do that. Do a total of 10 repetitions. Repeat 2 times. Complete this exercise 3 times per week. Exercise E: Plantar Flexors   1. Sit on the floor with your affected leg extended. 2. Loop a rubber exercise band or tube around the ball of your affected foot. The ball of your foot is on the walking surface, right under your toes. The band or tube should be slightly tense when your foot is relaxed. 3. Slowly point your toes downward, pushing them away from you. 4. Hold this position for 3 seconds. 5. Slowly release the tension in the band or tube, controlling smoothly until your foot is back in the starting position. Repeat for a total of 10 repetitions. Repeat 2 times. Complete this exercise 3 times per week. Exercise F: Towel Curls   1. Sit in a chair on a non-carpeted surface, and put your feet on the floor. 2. Place a towel in front of your feet.  3. Keeping your heel on the floor, put your affected foot on the towel. 4. Pull the towel toward you by grabbing the towel with your toes and curling them under. Keep your heel on the floor. 5. Let your toes relax. 6. Grab the towel again. Keep going until the towel is completely underneath your foot. Repeat for a total of 10 repetitions. Repeat 2 times. Complete this exercise 3 times per week. Exercise G: Heel Raise ( Plantar Flexors, Standing)    1. Stand with your feet shoulder-width apart. 2. Keep your weight spread evenly over the width of your feet while you rise up on your toes. Use a wall or table to  steady yourself, but try not to use it for support. 3. If this exercise is too easy, try these options: ? Shift your weight toward your affected leg until you feel challenged. ? If told by your health care provider, lift your uninjured leg off the floor. 4. Hold this position for 3 seconds. Repeat for a total of 10 repetitions. Repeat 2 times. Complete this exercise 3 times per week. Exercise H: Tandem Walking 1. Stand with one foot directly in front of the other. 2. Slowly raise your back foot up, lifting your heel before your toes, and place it directly in front of your other foot. 3. Continue to walk in this heel-to-toe way for 10 steps or for as long as told by your health care provider. Have a countertop or wall nearby to use if needed to keep your balance, but try not to hold onto anything for support. Repeat 2 times. Complete this exercises 3 times per week. Make sure you discuss any questions you have with your health care provider. Document Released: 02/27/2005 Document Revised: 12/14/2015 Document Reviewed: 01/01/2015 Elsevier Interactive Patient Education  2018 [employer].

## 2017-07-07 NOTE — Progress Notes (Signed)
Musculoskeletal Exam  Patient: Kimberly Mann DOB: [birth year]  DOS: 07/07/2017  SUBJECTIVE:  Chief Complaint:   Chief Complaint  Patient presents with  . Ankle Pain    left    Kimberly Mann is a 62 y.o.  female for evaluation and treatment of L ankle pain.   Onset:  1 week ago. Inverted ankle on uneven cement.  Location: Ant L ankle Character:  throbbing  Progression of issue:  is unchanged Associated symptoms: Swelling Treatment: to date has been rest, ice and OTC NSAIDS.   Neurovascular symptoms: no  ROS: Musculoskeletal/Extremities: +L ankle pain  Past Medical History:  Diagnosis Date  . Cancer (HCC)    breast  . Hypertension   . Vertigo     Objective: VITAL SIGNS: BP 124/86 (BP Location: Left Arm, Patient Position: Sitting, Cuff Size: Normal)   Pulse 81   Temp (!) 97.3 F (36.3 C) (Oral)   Ht 5\' 5"  (1.651 m)   Wt 238 lb 4 oz (108.1 kg)   SpO2 97%   BMI 39.65 kg/m  Constitutional: Well formed, well developed. No acute distress. Cardiovascular: Brisk cap refill Thorax & Lungs: No accessory muscle use Musculoskeletal: L ankle.   Normal active range of motion: yes.   Normal passive range of motion: yes Tenderness to palpation: yes, post lateral mall, ATFL, no prox fib head ttp Deformity: no Ecchymosis: no Tests positive: none  Tests negative: Squeeze test, anterior drawer Antalgic gait Neurologic: Normal sensory function.  Psychiatric: Normal mood. Age appropriate judgment and insight. Alert & oriented x 3.    Assessment:  Acute left ankle pain - Plan: DG Ankle Complete Left  Estrogen deficiency - Plan: DG Bone Density  Plan: X-rays unremarkable.  Crutches and Ace bandage given today.  Ice, stretches/exercises, Tylenol, ibuprofen.  Send me a my chart message in 1 week, will consider MRI vs PT referral.  F/u prn. The patient voiced understanding and agreement to the plan.   [place], DO 07/07/17  4:53 PM

## 2017-07-09 ENCOUNTER — Encounter: Payer: Self-pay | Admitting: Allergy and Immunology

## 2017-07-17 ENCOUNTER — Encounter: Payer: Medicare HMO | Admitting: Family Medicine

## 2017-07-22 ENCOUNTER — Telehealth: Payer: Self-pay | Admitting: Neurology

## 2017-07-22 DIAGNOSIS — M545 Low back pain, unspecified: Secondary | ICD-10-CM

## 2017-07-22 MED ORDER — OXYCODONE-ACETAMINOPHEN 10-325 MG PO TABS: 1.0000 | ORAL_TABLET | Freq: Four times a day (QID) | ORAL | 0 refills | Status: DC | PRN

## 2017-07-22 NOTE — Telephone Encounter (Signed)
Rx. awaiting RAS sig/fim 

## 2017-07-22 NOTE — Addendum Note (Signed)
Addended by: France Ravens I on: 07/22/2017 11:58 AM   Modules accepted: Orders

## 2017-07-22 NOTE — Telephone Encounter (Signed)
Rx. up front [facility]/fim 

## 2017-07-22 NOTE — Telephone Encounter (Signed)
Pt calling asking for a refill prescription for oxyCODONE-acetaminophen (PERCOCET) 10-325 MG tablet

## 2017-07-23 ENCOUNTER — Ambulatory Visit (INDEPENDENT_AMBULATORY_CARE_PROVIDER_SITE_OTHER): Payer: Medicare HMO | Admitting: Family Medicine

## 2017-07-23 ENCOUNTER — Encounter: Payer: Self-pay | Admitting: Family Medicine

## 2017-07-23 VITALS — BP 120/78 | HR 93 | Temp 98.0°F | Ht 65.0 in | Wt 241.2 lb

## 2017-07-23 DIAGNOSIS — Z1211 Encounter for screening for malignant neoplasm of colon: Secondary | ICD-10-CM

## 2017-07-23 DIAGNOSIS — Z23 Encounter for immunization: Secondary | ICD-10-CM

## 2017-07-23 DIAGNOSIS — Z1159 Encounter for screening for other viral diseases: Secondary | ICD-10-CM | POA: Diagnosis not present

## 2017-07-23 DIAGNOSIS — Z114 Encounter for screening for human immunodeficiency virus [HIV]: Secondary | ICD-10-CM | POA: Diagnosis not present

## 2017-07-23 DIAGNOSIS — Z0000 Encounter for general adult medical examination without abnormal findings: Secondary | ICD-10-CM

## 2017-07-23 LAB — COMPREHENSIVE METABOLIC PANEL
ALBUMIN: 4 g/dL (ref 3.5–5.2)
ALK PHOS: 55 U/L (ref 39–117)
ALT: 15 U/L (ref 0–35)
AST: 15 U/L (ref 0–37)
BILIRUBIN TOTAL: 0.3 mg/dL (ref 0.2–1.2)
BUN: 20 mg/dL (ref 6–23)
CALCIUM: 9.4 mg/dL (ref 8.4–10.5)
CHLORIDE: 103 meq/L (ref 96–112)
CO2: 32 mEq/L (ref 19–32)
CREATININE: 0.91 mg/dL (ref 0.40–1.20)
GFR: 80.29 mL/min (ref >60.00)
Glucose, Bld: 107 mg/dL — ABNORMAL HIGH (ref 70–99)
Potassium: 3.5 mEq/L (ref 3.5–5.1)
Sodium: 145 mEq/L (ref 135–145)
TOTAL PROTEIN: 7.8 g/dL (ref 6.0–8.3)

## 2017-07-23 LAB — LIPID PANEL
CHOLESTEROL: 234 mg/dL — AB (ref 0–200)
HDL: 48.8 mg/dL (ref >39.00)
LDL CALC: 166 mg/dL — AB (ref 0–99)
NonHDL: 185.35
TRIGLYCERIDES: 96 mg/dL (ref 0.0–149.0)
Total CHOL/HDL Ratio: 5
VLDL: 19.2 mg/dL (ref 0.0–40.0)

## 2017-07-23 LAB — CBC
HCT: 40.4 % (ref 36.0–46.0)
Hemoglobin: 13.3 g/dL (ref 12.0–15.0)
MCHC: 32.8 g/dL (ref 30.0–36.0)
MCV: 89.1 fl (ref 78.0–100.0)
Platelets: 253 10*3/uL (ref 150.0–400.0)
RBC: 4.54 Mil/uL (ref 3.87–5.11)
RDW: 15.6 % — ABNORMAL HIGH (ref 11.5–15.5)
WBC: 5.6 10*3/uL (ref 4.0–10.5)

## 2017-07-23 NOTE — Addendum Note (Signed)
Addended by: Sharon Seller B on: 07/23/2017 11:30 AM   Modules accepted: Orders

## 2017-07-23 NOTE — Progress Notes (Signed)
Chief Complaint  Patient presents with  . Annual Exam     Well Woman Kimberly Mann is here for a complete physical.   Her last physical was >1 year ago.  Current diet: in general, a "healthy" diet - could be better. Current exercise: walking. Weight is stable and she denies daytime fatigue. No LMP recorded. Patient is postmenopausal..  Seatbelt? Yes  Health Maintenance Pap/HPV- No Mammogram- dx'd with breast cancer Tetanus- No Hep C screening- No HIV screening- No  Colonoscopy- No  Past Medical History:  Diagnosis Date  . Cancer (HCC)    breast  . Hypertension   . Vertigo      Past Surgical History:  Procedure Laterality Date  . BREAST SURGERY    . LIPOSUCTION    . TUBAL LIGATION      Medications  Current Outpatient Medications on File Prior to Visit  Medication Sig Dispense Refill  . amLODipine (NORVASC) 5 MG tablet Take 5 mg by mouth daily.      . cetirizine-pseudoephedrine (ZYRTEC-D) 5-120 MG tablet Take 1 tablet by mouth 2 (two) times daily. 30 tablet 0  . fluconazole (DIFLUCAN) 150 MG tablet Take 1 tablet (150 mg total) by mouth daily. 15 tablet 0  . fluticasone (FLONASE) 50 MCG/ACT nasal spray Place 2 sprays into both nostrils daily. 48 g 1  . hydrOXYzine (VISTARIL) 25 MG capsule Take 1 capsule (25 mg total) by mouth 3 (three) times daily as needed. 60 capsule 0  . imipramine (TOFRANIL) 25 MG tablet Take one or two pills at bedtime 60 tablet 11  . loratadine (CLARITIN) 10 MG tablet     . meloxicam (MOBIC) 15 MG tablet Take 1 tablet (15 mg total) by mouth daily. 30 tablet 5  . Multiple Vitamins-Minerals (MULTIVITAMIN ADULT PO)     . oxyCODONE-acetaminophen (PERCOCET) 10-325 MG tablet Take 1 tablet by mouth every 6 (six) hours as needed for pain. 120 tablet 0  . promethazine (PHENERGAN) 25 MG tablet     . triamcinolone ointment (KENALOG) 0.5 % Apply 1 application topically 2 (two) times daily. 30 g 0  . triamterene-hydrochlorothiazide (MAXZIDE-25) 37.5-25  MG tablet Take 1 tablet by mouth daily. 90 tablet 1  . VOLTAREN 1 % GEL APPLY 2 GRAMS TOPICALLY 4 TIMES DAILY 300 g 0   Allergies Allergies  Allergen Reactions  . Morphine And Related Nausea And Vomiting and Swelling  . Penicillins   . Sulfa Antibiotics    Review of Systems: Constitutional:  no fevers Eye:  no recent significant change in vision Ear/Nose/Mouth/Throat:  Ears:  no tinnitus or vertigo and no recent change in hearing, Nose/Mouth/Throat:  no complaints of nasal congestion, no sore throat Cardiovascular: no chest pain, no palpitations Respiratory:  no cough and no shortness of breath Gastrointestinal:  no abdominal pain, no change in bowel habits, no significant change in appetite, no nausea, vomiting, diarrhea, or constipation and no black or bloody stool GU:  Female: negative for dysuria, frequency, and incontinence; no abnormal bleeding, pelvic pain, or discharge Musculoskeletal/Extremities: +L ankle pain (improving), otherwise no pain of the joints Integumentary (Skin/Breast):  no abnormal skin lesions reported Neurologic:  no headaches Endocrine:  denies fatigue Hematologic/Lymphatic:  no unexpected weight changes  Exam BP 120/78 (BP Location: Left Arm, Patient Position: Sitting, Cuff Size: Large)   Pulse 93   Temp 98 F (36.7 C) (Oral)   Ht 5\' 5"  (1.651 m)   Wt 241 lb 4 oz (109.4 kg)   SpO2 98%   BMI  40.15 kg/m  General:  well developed, well nourished, in no apparent distress Skin:  no significant moles, warts, or growths Head:  no masses, lesions, or tenderness Eyes:  pupils equal and round, sclera anicteric without injection Ears:  canals without lesions, TMs shiny without retraction, no obvious effusion, no erythema Nose:  nares patent, septum midline, mucosa normal, and no drainage or sinus tenderness Throat/Pharynx:  lips and gingiva without lesion; tongue and uvula midline; non-inflamed pharynx; no exudates or postnasal drainage Neck: neck supple  without adenopathy, thyromegaly, or masses Breasts:  Not done Thorax:  nontender Lungs:  clear to auscultation, breath sounds equal bilaterally, no respiratory distress Cardio:  regular rate and rhythm without murmurs, heart sounds without clicks or rubs, point of maximal impulse normal; no lifts, heaves, or thrills Abdomen:  abdomen soft, nontender; bowel sounds normal; no masses or organomegaly Genital: Defer to GYN Musculoskeletal:  symmetrical muscle groups noted without atrophy or deformity Extremities:  no clubbing, cyanosis, or edema, no deformities, no skin discoloration Neuro:  gait normal; deep tendon reflexes normal and symmetric Psych: well oriented with normal range of affect and appropriate judgment/insight  Assessment and Plan  Well adult exam - Plan: Comprehensive metabolic panel, CBC, Lipid panel  Screening for HIV (human immunodeficiency virus) - Plan: HIV antibody  Encounter for hepatitis C screening test for low risk patient - Plan: Hepatitis C antibody  Screen for colon cancer - Plan: Ambulatory referral to Gastroenterology   Well 62 y.o. female. Counseled on diet and exercise. Pt has BC, will defer future screening to her oncology team.  GYN info provided as she needs cervical cancer screening. Refer to GI for colonoscopy. Other orders as above. Follow up in 6 mo for med check or prn. The patient voiced understanding and agreement to the plan.  [place], DO 07/23/17 11:15 AM

## 2017-07-23 NOTE — Patient Instructions (Addendum)
Call Center for [place] at [facility] at [phone] for an appointment.  They are located at [address], [address], [place], [place], [address] (right across the hall from our office).  If you do not hear anything about your referral in the next 1-2 weeks, call our office and ask for an update.  2-3 days to get the results of your labs back.  Keep the diet clean and stay active!  Let us know if you need anything.

## 2017-07-23 NOTE — Progress Notes (Signed)
Pre visit review using our clinic review tool, if applicable. No additional management support is needed unless otherwise documented below in the visit note. 

## 2017-07-24 ENCOUNTER — Encounter: Payer: Self-pay | Admitting: Family Medicine

## 2017-07-24 LAB — HIV ANTIBODY (ROUTINE TESTING W REFLEX): HIV 1&2 Ab, 4th Generation: NONREACTIVE

## 2017-07-24 LAB — HEPATITIS C ANTIBODY
Hepatitis C Ab: NONREACTIVE
SIGNAL TO CUT-OFF: 0.07 (ref <1.00)

## 2017-07-25 ENCOUNTER — Other Ambulatory Visit: Payer: Self-pay | Admitting: Family Medicine

## 2017-07-25 ENCOUNTER — Other Ambulatory Visit (INDEPENDENT_AMBULATORY_CARE_PROVIDER_SITE_OTHER): Payer: Medicare HMO

## 2017-07-25 DIAGNOSIS — R739 Hyperglycemia, unspecified: Secondary | ICD-10-CM

## 2017-07-25 DIAGNOSIS — R7303 Prediabetes: Secondary | ICD-10-CM

## 2017-07-25 LAB — HEMOGLOBIN A1C: Hgb A1c MFr Bld: 6 % (ref 4.6–6.5)

## 2017-08-04 ENCOUNTER — Telehealth: Payer: Self-pay | Admitting: Neurology

## 2017-08-04 NOTE — Telephone Encounter (Signed)
Spoke with Kimberly Mann and have added her to [facility] cancellation list/fim

## 2017-08-04 NOTE — Telephone Encounter (Signed)
Spoke with Kimberly Mann and offered appt. tomorrow 4pm with [facility].  She accepted.  Added to schedule/fim

## 2017-08-04 NOTE — Telephone Encounter (Signed)
Patient having bad pain in his hip and would like to come in for a steroid injection. He is not scheduled for an appointment with Dr. Felecia Shelling until 10-02-17.

## 2017-08-05 ENCOUNTER — Other Ambulatory Visit: Payer: Self-pay

## 2017-08-05 ENCOUNTER — Encounter: Payer: Self-pay | Admitting: Neurology

## 2017-08-05 ENCOUNTER — Ambulatory Visit (INDEPENDENT_AMBULATORY_CARE_PROVIDER_SITE_OTHER): Payer: Medicare HMO | Admitting: Neurology

## 2017-08-05 VITALS — BP 129/80 | HR 85 | Ht 65.0 in | Wt 240.0 lb

## 2017-08-05 DIAGNOSIS — M545 Low back pain, unspecified: Secondary | ICD-10-CM

## 2017-08-05 DIAGNOSIS — M542 Cervicalgia: Secondary | ICD-10-CM | POA: Diagnosis not present

## 2017-08-05 DIAGNOSIS — M7062 Trochanteric bursitis, left hip: Secondary | ICD-10-CM

## 2017-08-05 DIAGNOSIS — M7061 Trochanteric bursitis, right hip: Secondary | ICD-10-CM

## 2017-08-05 MED ORDER — OXYCODONE-ACETAMINOPHEN 10-325 MG PO TABS: 1.0000 | ORAL_TABLET | Freq: Four times a day (QID) | ORAL | 0 refills | Status: DC | PRN

## 2017-08-05 MED ORDER — FLUCONAZOLE 150 MG PO TABS: 150.0000 mg | ORAL_TABLET | Freq: Every day | ORAL | 0 refills | Status: DC

## 2017-08-05 MED ORDER — DICLOFENAC SODIUM 1 % TD GEL: 2.0000 g | Freq: Four times a day (QID) | TRANSDERMAL | 5 refills | Status: DC

## 2017-08-05 NOTE — Progress Notes (Signed)
t  [facility]  PATIENT: Kimberly Mann DOB: [birth year]  REFERRING CLINICIAN: [name]  HISTORY FROM: Patient  REASON FOR VISIT: Left shoulder and neck pain; back pain [  HISTORICAL  CHIEF COMPLAINT:  Chief Complaint  Patient presents with  . Neck Pain    Sts. neck and back pain are worse. C/O bilat hip pain, left worse than right. Requesting bilat hip inj. today if appropriate/fim  . Back Pain  . Hip Pain    HISTORY OF PRESENT ILLNESS:  Kimberly Mann is a 62 yo woman.   Currently, her worse pain is in the right neck and in the right hip and lower back region.    Update 08/05/2017: She is reporting more hip pain, bilaterally.    Pain increases if she lays on that side.  Trochanteric injections ususally help for a few months.    She also has back pain.  She has milder neck pain, worse with tirning he head.   Shoulders are only mildly tender  Update 06/04/2017: Neck, back and shoulder pain improved after the trigger point injections in October but have returned over the last month.      MRI cervical spine showed DJD at C3C4-C5C6.    Her worse pain is in the left shoulder.   Pain increases if she lays on that side.    She also has right shoulder region pain.   She also has neck pain that increases as the day goes on.   Her LB is hurting but not as bad as the neck and shoulders.   Hipas are hurting a lot over the bursae.    Bursa injections have helped in the past.    She sees Dr. Harlow Asa for breast cancer.   She had surgery and radiation and will be starting a pill x 5 days  Update 01/30/2017:    Since the last visit, her neck pain has greatly increased. She is undergoing radiation for breast cancer and she notes that laying down on the table is painful for her.   The pain is in the left neck more than the right. Additionally she is noticing more lower back pain also little worse on the left than the right. She denies any change in strength or sensation in the arms  or legs. There is no change in her bladder.    Her hip and shoulder pain improved quite a bit after the joint injections at her last visit. ________________________________ From 01/30/2017 LBP/hip pain:   She reports bilateral lower back pain and pain that is in the buttock and hip region, generally worse on the right.   Pain does not radiate into her legs. Pain is worse with walking and with standing up from a seated position.  In the past, trochanteric bursa injection has helped.   Neck/Shoulder pain:   She reports pain in the neck and shoulders, worse on her right in the neck but in the left > right shulder.    Pain is worse with arms elevated,    Her pain also increases with external rotation and elevation.  Subacromial bursa injection often helped in the past.   However, she has had some itching following the steroid shots and it was especially bad after the last one 3-4 months ago.    ROM is mildly reduced in shoulders.    No arm weakness.   Breast cancer:   She needed surgery x 2 and is currently on RadRx for breast cancer.  Med's:  For her pain she is on Percocet 4 times a day with benefit.  Sheis also on meloxicam and tizanidine.   She has not shown any drug-seeking behavior       Migraine headaches occur about once a week. If Percocet does not help the migraine she will take a meloxicam.    REVIEW OF SYSTEMS:  Constitutional: No fevers, chills, sweats, or change in appetite Eyes: No visual changes, double vision, eye pain Ear, nose and throat: No hearing loss, ear pain, nasal congestion, sore throat Cardiovascular: No chest pain, palpitations Respiratory:  No shortness of breath at rest or with exertion.   No wheezes GastrointestinaI: No nausea, vomiting, diarrhea, abdominal pain, fecal incontinence Genitourinary:  No dysuria, urinary retention or frequency.  No nocturia. Musculoskeletal:  see above Integumentary: No rash, pruritus, skin lesions Neurological: as  above Psychiatric: No depression at this time.  No anxiety Endocrine: No palpitations, diaphoresis, change in appetite, change in weigh or increased thirst  ALLERGIES: Allergies  Allergen Reactions  . Morphine And Related Nausea And Vomiting and Swelling  . Penicillins   . Sulfa Antibiotics     HOME MEDICATIONS: Outpatient Medications Prior to Visit  Medication Sig Dispense Refill  . amLODipine (NORVASC) 5 MG tablet Take 5 mg by mouth daily.      . fluticasone (FLONASE) 50 MCG/ACT nasal spray Place 2 sprays into both nostrils daily. 48 g 1  . hydrOXYzine (VISTARIL) 25 MG capsule Take 1 capsule (25 mg total) by mouth 3 (three) times daily as needed. 60 capsule 0  . loratadine (CLARITIN) 10 MG tablet     . meloxicam (MOBIC) 15 MG tablet Take 1 tablet (15 mg total) by mouth daily. 30 tablet 5  . Multiple Vitamins-Minerals (MULTIVITAMIN ADULT PO)     . promethazine (PHENERGAN) 25 MG tablet     . triamcinolone ointment (KENALOG) 0.5 % Apply 1 application topically 2 (two) times daily. 30 g 0  . triamterene-hydrochlorothiazide (MAXZIDE-25) 37.5-25 MG tablet Take 1 tablet by mouth daily. 90 tablet 1  . fluconazole (DIFLUCAN) 150 MG tablet Take 1 tablet (150 mg total) by mouth daily. 15 tablet 0  . oxyCODONE-acetaminophen (PERCOCET) 10-325 MG tablet Take 1 tablet by mouth every 6 (six) hours as needed for pain. 120 tablet 0  . VOLTAREN 1 % GEL APPLY 2 GRAMS TOPICALLY 4 TIMES DAILY 300 g 0  . cetirizine-pseudoephedrine (ZYRTEC-D) 5-120 MG tablet Take 1 tablet by mouth 2 (two) times daily. 30 tablet 0  . imipramine (TOFRANIL) 25 MG tablet Take one or two pills at bedtime 60 tablet 11   No facility-administered medications prior to visit.     PAST MEDICAL HISTORY: Past Medical History:  Diagnosis Date  . Cancer (HCC)    breast  . Hypertension   . Vertigo     PAST SURGICAL HISTORY: Past Surgical History:  Procedure Laterality Date  . BREAST SURGERY    . LIPOSUCTION    . TUBAL  LIGATION      FAMILY HISTORY: Family History  Problem Relation Age of Onset  . Hypertension Mother     SOCIAL HISTORY:  Social History   Socioeconomic History  . Marital status: Legally Separated    Spouse name: Not on file  . Number of children: Not on file  . Years of education: Not on file  . Highest education level: Not on file  Occupational History  . Not on file  Social Needs  . Financial resource strain: Not on file  .  Food insecurity:    Worry: Not on file    Inability: Not on file  . Transportation needs:    Medical: Not on file    Non-medical: Not on file  Tobacco Use  . Smoking status: Former Smoker    Last attempt to quit: 05/17/1974    Years since quitting: 43.2  . Smokeless tobacco: Never Used  Substance and Sexual Activity  . Alcohol use: Yes    Alcohol/week: 4.2 oz    Types: 7 Standard drinks or equivalent per week    Comment: one glass of wine with dinner/fim  . Drug use: No    Types: Marijuana  . Sexual activity: Not on file  Lifestyle  . Physical activity:    Days per week: Not on file    Minutes per session: Not on file  . Stress: Not on file  Relationships  . Social connections:    Talks on phone: Not on file    Gets together: Not on file    Attends religious service: Not on file    Active member of club or organization: Not on file    Attends meetings of clubs or organizations: Not on file    Relationship status: Not on file  . Intimate partner violence:    Fear of current or ex partner: Not on file    Emotionally abused: Not on file    Physically abused: Not on file    Forced sexual activity: Not on file  Other Topics Concern  . Not on file  Social History Narrative  . Not on file     PHYSICAL EXAM  Vitals:   08/05/17 1549  BP: 129/80  Pulse: 85  Weight: 240 lb (108.9 kg)  Height: 5\' 5"  (1.651 m)    Body mass index is 39.94 kg/m.   General: The patient is well-developed and well-nourished and in no acute distress    Musculoskeletal:    She has tenderness that is moderate over the trochanteric bursae bilaterally.  There is milder tenderness over the piriformis muscles in the cervical paraspinal muscles.  Neurologic Exam  Mental status: The patient is alert and oriented x 3 at the time of the examination. The patient has apparent normal recent and remote memory, with an apparently normal attention span and concentration ability.    Cranial nerves:    Trapezius and sternocleidomastoid strength is normal. No dysarthria is noted.    No obvious hearing deficits are noted.  Motor:  Muscle bulk and tone are normal. Strength is 5/5 in the arms or legs.   Gait and station: Station is normal. Gait is arthritic Tandem gait is mildly wide.   DTRs:   Reflexes are normal and symmetric in the arms and legs.     DIAGNOSTIC DATA (LABS, IMAGING, TESTING) - I reviewed patient records, labs, notes, testing and imaging myself where available.     ASSESSMENT AND PLAN   Neck pain  Back pain, lumbosacral - Plan: oxyCODONE-acetaminophen (PERCOCET) 10-325 MG tablet  Trochanteric bursitis of left hip  Trochanteric bursitis, right hip   1.    Bilateral trochanteric bursa injections with a total of 80 mg Depo-Medrol and 5 cc Marcaine.  She tolerated the injections well and there were no complications.   2.   Renew Percocet.  The [place] controlled substance database was reviewed.  Continue other medications. 3.   Try to stay active and exercises tolerated. 4.    I will call in a refill of the Diflucan  as she often gets a yeast infection after a steroid shot. 5.    She will return to see me in about 3-4 months, sooner if she has new or worsening neurologic symptoms.    Richard A. Felecia Shelling, MD, PhD 0/0/1749, 4:49 PM Certified in Neurology, Clinical Neurophysiology, Sleep Medicine, Pain Medicine and Neuroimaging  [facility] [address], [place], [place] [address] [phone]

## 2017-08-13 ENCOUNTER — Other Ambulatory Visit (HOSPITAL_COMMUNITY)
Admission: RE | Admit: 2017-08-13 | Discharge: 2017-08-13 | Disposition: A | Discharge status: Home / Self Care | Payer: Medicare HMO | Source: Ambulatory Visit | Attending: Obstetrics & Gynecology | Admitting: Obstetrics & Gynecology

## 2017-08-13 ENCOUNTER — Encounter: Payer: Self-pay | Admitting: Obstetrics & Gynecology

## 2017-08-13 ENCOUNTER — Ambulatory Visit (INDEPENDENT_AMBULATORY_CARE_PROVIDER_SITE_OTHER): Payer: Medicare HMO | Admitting: Obstetrics & Gynecology

## 2017-08-13 VITALS — BP 121/88 | HR 92 | Ht 65.0 in | Wt 235.0 lb

## 2017-08-13 DIAGNOSIS — N951 Menopausal and female climacteric states: Secondary | ICD-10-CM

## 2017-08-13 DIAGNOSIS — Z01419 Encounter for gynecological examination (general) (routine) without abnormal findings: Secondary | ICD-10-CM | POA: Diagnosis present

## 2017-08-13 DIAGNOSIS — L308 Other specified dermatitis: Secondary | ICD-10-CM

## 2017-08-13 MED ORDER — CITALOPRAM HYDROBROMIDE 20 MG PO TABS: 20.0000 mg | ORAL_TABLET | Freq: Every day | ORAL | 12 refills | Status: DC

## 2017-08-13 MED ORDER — CLOTRIMAZOLE-BETAMETHASONE 1-0.05 % EX CREA
1.0000 | TOPICAL_CREAM | Freq: Two times a day (BID) | CUTANEOUS | 1 refills | Status: DC
Start: 2017-08-13 — End: 2017-11-20

## 2017-08-13 NOTE — Progress Notes (Signed)
Subjective:     Kimberly Mann is a 62 y.o. female here for a routine exam.  LMP 8 years prev. No bleeding since that time. Current complaints: hot flushes. Pt was recently treated for breast cancer. On Tamoxifen currently. Initial surgery Aug 2018.   Now with severe hot flushes.    Gynecologic History No LMP recorded. Patient is postmenopausal. Contraception: post menopausal status Last Pap: ~2014. Results were: normal Last mammogram: 10/2016  Obstetric History OB History  Gravida Para Term Preterm AB Living  1 1 1         SAB TAB Ectopic Multiple Live Births          1    # Outcome Date GA Lbr Len/2nd Weight Sex Delivery Anes PTL Lv  1 Term      Vag-Spont      The following portions of the patient's history were reviewed and updated as appropriate: allergies, current medications, past family history, past medical history, past social history, past surgical history and problem list.  Review of Systems Pertinent items are noted in HPI.    Objective:  BP 121/88   Pulse 92   Ht 5\' 5"  (1.651 m)   Wt 235 lb (106.6 kg)   BMI 39.11 kg/m   General Appearance:    Alert, cooperative, no distress, appears stated age  Head:    Normocephalic, without obvious abnormality, atraumatic  Eyes:    conjunctiva/corneas clear, EOM's intact, both eyes  Ears:    Normal external ear canals, both ears  Nose:   Nares normal, septum midline, mucosa normal, no drainage    or sinus tenderness  Throat:   Lips, mucosa, and tongue normal; teeth and gums normal  Neck:   Supple, symmetrical, trachea midline, no adenopathy;    thyroid:  no enlargement/tenderness/nodules  Back:     Symmetric, no curvature, ROM normal, no CVA tenderness  Lungs:     Clear to auscultation bilaterally, respirations unlabored  Chest Wall:    No tenderness or deformity   Heart:    Regular rate and rhythm, S1 and S2 normal, no murmur, rub   or gallop  Breast Exam:    No tenderness, masses, or nipple abnormality; mammoplasty  incisions bilaterally   Abdomen:     Soft, non-tender, bowel sounds active all four quadrants,    no masses, no organomegaly  Genitalia:    Normal female without lesion, discharge or tenderness     Extremities:   Extremities normal, atraumatic, no cyanosis or edema  Pulses:   2+ and symmetric all extremities  Skin:   Skin color, texture, turgor normal, no rashes or lesions     Assessment:    Healthy female exam.   Hot flushes- pt with a h/o breast cancer. I have discussed with her nonhormonal options for hot flushes.  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    Celexa 20mg  po q day Coconut oil bid to vulva Lotrizone to umbilicus bid.   Kery Batzel L. Harraway-Smith, M.D., Kimberly Mann

## 2017-08-13 NOTE — Progress Notes (Signed)
Patient complaining of hot flashes. 64 in

## 2017-08-14 ENCOUNTER — Encounter: Payer: Self-pay | Admitting: Family Medicine

## 2017-08-18 LAB — CYTOLOGY - PAP
Diagnosis: NEGATIVE
HPV (WINDOPATH): NOT DETECTED

## 2017-08-19 ENCOUNTER — Other Ambulatory Visit: Payer: Self-pay | Admitting: Family Medicine

## 2017-08-19 MED ORDER — BENZONATATE 100 MG PO CAPS: 100.0000 mg | ORAL_CAPSULE | Freq: Three times a day (TID) | ORAL | 0 refills | Status: DC | PRN

## 2017-08-25 ENCOUNTER — Encounter: Payer: Self-pay | Admitting: Family Medicine

## 2017-08-25 ENCOUNTER — Encounter: Payer: Self-pay | Admitting: Obstetrics & Gynecology

## 2017-09-08 ENCOUNTER — Telehealth: Payer: Self-pay | Admitting: *Deleted

## 2017-09-08 NOTE — Telephone Encounter (Signed)
Received Medical records from [facility]; forwarded to provider/SLS 05/13

## 2017-09-23 ENCOUNTER — Telehealth: Payer: Self-pay | Admitting: Neurology

## 2017-09-23 DIAGNOSIS — M545 Low back pain, unspecified: Secondary | ICD-10-CM

## 2017-09-23 MED ORDER — OXYCODONE-ACETAMINOPHEN 10-325 MG PO TABS: 1.0000 | ORAL_TABLET | Freq: Four times a day (QID) | ORAL | 0 refills | Status: DC | PRN

## 2017-09-23 NOTE — Telephone Encounter (Signed)
Rx. awaiting RAS sig/fim 

## 2017-09-23 NOTE — Telephone Encounter (Signed)
Rx. up front [facility]/fim 

## 2017-09-23 NOTE — Telephone Encounter (Signed)
Pt requesting a refill for oxyCODONE-acetaminophen (PERCOCET) 10-325 MG tablet

## 2017-10-02 ENCOUNTER — Ambulatory Visit: Payer: Medicare HMO | Admitting: Neurology

## 2017-10-15 ENCOUNTER — Encounter: Payer: Self-pay | Admitting: Obstetrics and Gynecology

## 2017-10-15 ENCOUNTER — Ambulatory Visit (INDEPENDENT_AMBULATORY_CARE_PROVIDER_SITE_OTHER): Payer: Medicare HMO | Admitting: Obstetrics and Gynecology

## 2017-10-15 VITALS — BP 145/63 | HR 89 | Ht 65.0 in | Wt 237.0 lb

## 2017-10-15 DIAGNOSIS — N649 Disorder of breast, unspecified: Secondary | ICD-10-CM | POA: Diagnosis not present

## 2017-10-15 DIAGNOSIS — L988 Other specified disorders of the skin and subcutaneous tissue: Secondary | ICD-10-CM

## 2017-10-15 DIAGNOSIS — Z1211 Encounter for screening for malignant neoplasm of colon: Secondary | ICD-10-CM

## 2017-10-15 NOTE — Progress Notes (Signed)
GYNECOLOGY OFFICE VISIT NOTE  History:  62 y.o. G1P1 here today for rash on left breast. States it has been there for a few months, unsure of exactly how long. She did not think much about it until it started itching recently, which she has used over the counter hydrocortisone cream on it to poor effect. Has not increased or decreased in size. She thinks it may look red because she has been scratching it. Has not seen anybody for it, had mammogram in 08/2017 that she reports was normal.  She has a history of breast cancer in right breast s/p lumpectomy with SNL biopsy, found to have invasive ductal carcinoma, right breast, pT1c pN0. She is followed by Oncology and is currently on Tamoxifen, does have significant hot flushes. Follows with [facility] for her breast surgery and ongoing management.  Past Medical History:  Diagnosis Date  . Cancer (HCC)    breast  . Hypertension   . Vertigo     Past Surgical History:  Procedure Laterality Date  . BREAST SURGERY    . LIPOSUCTION    . TUBAL LIGATION      Current Outpatient Medications:  .  amLODipine (NORVASC) 5 MG tablet, Take 5 mg by mouth daily.  , Disp: , Rfl:  .  benzonatate (TESSALON) 100 MG capsule, Take 1 capsule (100 mg total) by mouth 3 (three) times daily as needed., Disp: 30 capsule, Rfl: 0 .  cetirizine-pseudoephedrine (ZYRTEC-D) 5-120 MG tablet, Take 1 tablet by mouth 2 (two) times daily., Disp: 30 tablet, Rfl: 0 .  citalopram (CELEXA) 20 MG tablet, Take 1 tablet (20 mg total) by mouth daily., Disp: 30 tablet, Rfl: 12 .  clotrimazole-betamethasone (LOTRISONE) cream, Apply 1 application topically 2 (two) times daily., Disp: 45 g, Rfl: 1 .  diclofenac sodium (VOLTAREN) 1 % GEL, Apply 2 g topically 4 (four) times daily., Disp: 300 g, Rfl: 5 .  fluconazole (DIFLUCAN) 150 MG tablet, Take 1 tablet (150 mg total) by mouth daily., Disp: 15 tablet, Rfl: 0 .  fluticasone (FLONASE) 50 MCG/ACT nasal spray, Place 2 sprays into both nostrils  daily., Disp: 48 g, Rfl: 1 .  hydrOXYzine (VISTARIL) 25 MG capsule, Take 1 capsule (25 mg total) by mouth 3 (three) times daily as needed., Disp: 60 capsule, Rfl: 0 .  imipramine (TOFRANIL) 25 MG tablet, Take one or two pills at bedtime, Disp: 60 tablet, Rfl: 11 .  loratadine (CLARITIN) 10 MG tablet, , Disp: , Rfl:  .  meloxicam (MOBIC) 15 MG tablet, Take 1 tablet (15 mg total) by mouth daily., Disp: 30 tablet, Rfl: 5 .  Multiple Vitamins-Minerals (MULTIVITAMIN ADULT PO), , Disp: , Rfl:  .  oxyCODONE-acetaminophen (PERCOCET) 10-325 MG tablet, Take 1 tablet by mouth every 6 (six) hours as needed for pain., Disp: 120 tablet, Rfl: 0 .  promethazine (PHENERGAN) 25 MG tablet, , Disp: , Rfl:  .  triamcinolone ointment (KENALOG) 0.5 %, Apply 1 application topically 2 (two) times daily., Disp: 30 g, Rfl: 0 .  triamterene-hydrochlorothiazide (MAXZIDE-25) 37.5-25 MG tablet, Take 1 tablet by mouth daily., Disp: 90 tablet, Rfl: 1  The following portions of the patient's history were reviewed and updated as appropriate: allergies, current medications, past family history, past medical history, past social history, past surgical history and problem list.   Review of Systems:  Pertinent items noted in HPI and remainder of comprehensive ROS otherwise negative.   Objective:  Physical Exam BP (!) 145/63   Pulse 89   Ht 5'  5" (1.651 m)   Wt 237 lb (107.5 kg)   BMI 39.44 kg/m  CONSTITUTIONAL: Well-developed, well-nourished female in no acute distress. Marc Morgans herself. NEUROLOGIC: Alert and oriented to person, place, and time. Normal reflexes, muscle tone coordination. No cranial nerve deficit noted. PSYCHIATRIC: Normal mood and affect. Normal behavior. Normal judgment and thought content. BREAST: right breast with multiple well healed scars, no abnormal skin lesions or other palpable lesions, no tenderness. Left breast with irritated appearing lesion at 7 o'clock, 3.5 x 4.5 cm scabbed area of skin and  erythematous area surrounding lesion, partially extending into the areola. No tenderness, oozing or bleeding noted, no palpable masses within the lesion area or otherwise palpable masses within the breast MUSCULOSKELETAL: Normal range of motion. No edema noted.  Labs and Imaging Mammogram in [place] history of right breast cancer status post lumpectomy 2018  EXAM: DIGITAL DIAGNOSTIC BILATERAL MAMMOGRAM WITH CAD AND TOMO  COMPARISON:Previous exam(s).  ACR Breast Density Category b: There are scattered areas of fibroglandular density.  FINDINGS: Cc and MLO views of bilateral breasts, spot tangential view of right breast are submitted. Postsurgical changes are identified within the right breast. No suspicious abnormalities identified bilaterally.  Mammographic images were processed with CAD.  IMPRESSION: Benign findings.  RECOMMENDATION: Bilateral diagnostic mammogram in 1 year.  I have discussed the findings and recommendations with the patient. Results were also provided in writing at the conclusion of the visit. If applicable, a reminder letter will be sent to the patient regarding the next appointment.  BI-RADS CATEGORY2: Benign.   Electronically Signed ByMichaelle Copas M.D. On: 09/03/2017 15:44  Assessment & Plan:   1. Lesion of skin of breast - Suggest biopsy of breast lesion, reviewed concern with patient this could potentially be malignant, but needs to be biopsied, she is in agreement with this plan - Patient will f/u with her breast surgeon Dr. Edgar Frisk for biopsy - appointment made for 10/20/17, patient aware of appointment - Ambulatory referral to General Surgery  2. Encounter for screening colonoscopy Last colonoscopy done 2010, due 2010 (noted in records from [facility]) - Ambulatory referral to Gastroenterology  Routine preventative health maintenance measures emphasized. Please refer to After Visit  Summary for other counseling recommendations.   Return in about 3 months (around 01/15/2018), or if symptoms worsen or fail to improve.    Feliz Beam, M.D. Attending [place], [facility] for [employer], [place]

## 2017-10-15 NOTE — Progress Notes (Signed)
Patient states she has rash on left breast and states she has had itching. Patient has has hx of cancer of right breast. Kathrene Alu RN

## 2017-10-23 ENCOUNTER — Other Ambulatory Visit: Payer: Self-pay

## 2017-10-23 DIAGNOSIS — Z1211 Encounter for screening for malignant neoplasm of colon: Secondary | ICD-10-CM

## 2017-10-23 NOTE — Progress Notes (Signed)
Needs referral changed. Kathrene Alu RN

## 2017-10-27 ENCOUNTER — Telehealth: Payer: Self-pay | Admitting: Neurology

## 2017-10-27 DIAGNOSIS — M545 Low back pain, unspecified: Secondary | ICD-10-CM

## 2017-10-27 MED ORDER — OXYCODONE-ACETAMINOPHEN 10-325 MG PO TABS: 1.0000 | ORAL_TABLET | Freq: Four times a day (QID) | ORAL | 0 refills | Status: DC | PRN

## 2017-10-27 NOTE — Addendum Note (Signed)
Addended by: France Ravens I on: 10/27/2017 01:38 PM   Modules accepted: Orders

## 2017-10-27 NOTE — Telephone Encounter (Signed)
Rx. up front [facility]/fim 

## 2017-10-27 NOTE — Telephone Encounter (Signed)
Rx. awaiting YY sig/fim

## 2017-10-27 NOTE — Telephone Encounter (Signed)
Patient requesting refill of oxyCODONE-acetaminophen (PERCOCET) 10-325 MG tablet. I advised Dr. Sater is out of the office and will send to his nurse. ° ° °

## 2017-11-12 ENCOUNTER — Encounter (HOSPITAL_BASED_OUTPATIENT_CLINIC_OR_DEPARTMENT_OTHER): Payer: Self-pay | Admitting: *Deleted

## 2017-11-12 ENCOUNTER — Other Ambulatory Visit: Payer: Self-pay

## 2017-11-14 ENCOUNTER — Ambulatory Visit: Payer: Self-pay | Admitting: Plastic Surgery

## 2017-11-14 DIAGNOSIS — N6489 Other specified disorders of breast: Secondary | ICD-10-CM

## 2017-11-18 ENCOUNTER — Encounter (HOSPITAL_BASED_OUTPATIENT_CLINIC_OR_DEPARTMENT_OTHER)
Admission: RE | Admit: 2017-11-18 | Discharge: 2017-11-18 | Disposition: A | Discharge status: Home / Self Care | Payer: Medicare HMO | Source: Ambulatory Visit | Attending: Plastic Surgery | Admitting: Plastic Surgery

## 2017-11-18 ENCOUNTER — Other Ambulatory Visit: Payer: Self-pay

## 2017-11-18 DIAGNOSIS — F419 Anxiety disorder, unspecified: Secondary | ICD-10-CM | POA: Diagnosis not present

## 2017-11-18 DIAGNOSIS — Z87891 Personal history of nicotine dependence: Secondary | ICD-10-CM | POA: Diagnosis not present

## 2017-11-18 DIAGNOSIS — Z880 Allergy status to penicillin: Secondary | ICD-10-CM | POA: Diagnosis not present

## 2017-11-18 DIAGNOSIS — I10 Essential (primary) hypertension: Secondary | ICD-10-CM | POA: Diagnosis not present

## 2017-11-18 DIAGNOSIS — Z9011 Acquired absence of right breast and nipple: Secondary | ICD-10-CM | POA: Diagnosis not present

## 2017-11-18 DIAGNOSIS — Z882 Allergy status to sulfonamides status: Secondary | ICD-10-CM | POA: Diagnosis not present

## 2017-11-18 DIAGNOSIS — G8929 Other chronic pain: Secondary | ICD-10-CM | POA: Diagnosis not present

## 2017-11-18 DIAGNOSIS — Z170 Estrogen receptor positive status [ER+]: Secondary | ICD-10-CM | POA: Diagnosis not present

## 2017-11-18 DIAGNOSIS — Z885 Allergy status to narcotic agent status: Secondary | ICD-10-CM | POA: Diagnosis not present

## 2017-11-18 DIAGNOSIS — Z7951 Long term (current) use of inhaled steroids: Secondary | ICD-10-CM | POA: Diagnosis not present

## 2017-11-18 DIAGNOSIS — Z923 Personal history of irradiation: Secondary | ICD-10-CM | POA: Diagnosis not present

## 2017-11-18 DIAGNOSIS — E669 Obesity, unspecified: Secondary | ICD-10-CM | POA: Diagnosis not present

## 2017-11-18 DIAGNOSIS — M1990 Unspecified osteoarthritis, unspecified site: Secondary | ICD-10-CM | POA: Diagnosis not present

## 2017-11-18 DIAGNOSIS — Z79810 Long term (current) use of selective estrogen receptor modulators (SERMs): Secondary | ICD-10-CM | POA: Diagnosis not present

## 2017-11-18 DIAGNOSIS — Z79899 Other long term (current) drug therapy: Secondary | ICD-10-CM | POA: Diagnosis not present

## 2017-11-18 DIAGNOSIS — Z853 Personal history of malignant neoplasm of breast: Secondary | ICD-10-CM | POA: Diagnosis not present

## 2017-11-18 DIAGNOSIS — Z01810 Encounter for preprocedural cardiovascular examination: Secondary | ICD-10-CM | POA: Diagnosis not present

## 2017-11-18 DIAGNOSIS — Z6839 Body mass index (BMI) 39.0-39.9, adult: Secondary | ICD-10-CM | POA: Diagnosis not present

## 2017-11-18 DIAGNOSIS — Z01812 Encounter for preprocedural laboratory examination: Secondary | ICD-10-CM | POA: Diagnosis not present

## 2017-11-18 DIAGNOSIS — M549 Dorsalgia, unspecified: Secondary | ICD-10-CM | POA: Diagnosis not present

## 2017-11-18 DIAGNOSIS — N6489 Other specified disorders of breast: Secondary | ICD-10-CM | POA: Diagnosis present

## 2017-11-18 LAB — BASIC METABOLIC PANEL
Anion gap: 9 (ref 5–15)
BUN: 17 mg/dL (ref 8–23)
CO2: 25 mmol/L (ref 22–32)
CREATININE: 0.79 mg/dL (ref 0.44–1.00)
Calcium: 9.1 mg/dL (ref 8.9–10.3)
Chloride: 107 mmol/L (ref 98–111)
GFR calc Af Amer: >60 mL/min (ref >60)
Glucose, Bld: 92 mg/dL (ref 70–99)
POTASSIUM: 4.3 mmol/L (ref 3.5–5.1)
SODIUM: 141 mmol/L (ref 135–145)

## 2017-11-20 ENCOUNTER — Ambulatory Visit (HOSPITAL_BASED_OUTPATIENT_CLINIC_OR_DEPARTMENT_OTHER): Payer: Medicare HMO | Admitting: Anesthesiology

## 2017-11-20 ENCOUNTER — Ambulatory Visit (HOSPITAL_BASED_OUTPATIENT_CLINIC_OR_DEPARTMENT_OTHER)
Admission: RE | Admit: 2017-11-20 | Discharge: 2017-11-20 | Disposition: A | Discharge status: Home / Self Care | Payer: Medicare HMO | Source: Ambulatory Visit | Attending: Plastic Surgery | Admitting: Plastic Surgery

## 2017-11-20 ENCOUNTER — Encounter (HOSPITAL_BASED_OUTPATIENT_CLINIC_OR_DEPARTMENT_OTHER): Payer: Self-pay | Admitting: *Deleted

## 2017-11-20 ENCOUNTER — Other Ambulatory Visit: Payer: Self-pay

## 2017-11-20 ENCOUNTER — Encounter (HOSPITAL_BASED_OUTPATIENT_CLINIC_OR_DEPARTMENT_OTHER)
Admission: RE | Disposition: A | Discharge status: Home / Self Care | Payer: Self-pay | Source: Ambulatory Visit | Attending: Plastic Surgery

## 2017-11-20 DIAGNOSIS — Z6839 Body mass index (BMI) 39.0-39.9, adult: Secondary | ICD-10-CM | POA: Insufficient documentation

## 2017-11-20 DIAGNOSIS — Z170 Estrogen receptor positive status [ER+]: Secondary | ICD-10-CM | POA: Insufficient documentation

## 2017-11-20 DIAGNOSIS — Z79899 Other long term (current) drug therapy: Secondary | ICD-10-CM | POA: Insufficient documentation

## 2017-11-20 DIAGNOSIS — Z01812 Encounter for preprocedural laboratory examination: Secondary | ICD-10-CM | POA: Insufficient documentation

## 2017-11-20 DIAGNOSIS — Z79810 Long term (current) use of selective estrogen receptor modulators (SERMs): Secondary | ICD-10-CM | POA: Insufficient documentation

## 2017-11-20 DIAGNOSIS — M1990 Unspecified osteoarthritis, unspecified site: Secondary | ICD-10-CM | POA: Insufficient documentation

## 2017-11-20 DIAGNOSIS — I10 Essential (primary) hypertension: Secondary | ICD-10-CM | POA: Insufficient documentation

## 2017-11-20 DIAGNOSIS — Z01810 Encounter for preprocedural cardiovascular examination: Secondary | ICD-10-CM | POA: Diagnosis not present

## 2017-11-20 DIAGNOSIS — F419 Anxiety disorder, unspecified: Secondary | ICD-10-CM | POA: Insufficient documentation

## 2017-11-20 DIAGNOSIS — N6489 Other specified disorders of breast: Secondary | ICD-10-CM | POA: Diagnosis not present

## 2017-11-20 DIAGNOSIS — Z882 Allergy status to sulfonamides status: Secondary | ICD-10-CM | POA: Insufficient documentation

## 2017-11-20 DIAGNOSIS — Z87891 Personal history of nicotine dependence: Secondary | ICD-10-CM | POA: Diagnosis not present

## 2017-11-20 DIAGNOSIS — M549 Dorsalgia, unspecified: Secondary | ICD-10-CM | POA: Insufficient documentation

## 2017-11-20 DIAGNOSIS — G8929 Other chronic pain: Secondary | ICD-10-CM | POA: Insufficient documentation

## 2017-11-20 DIAGNOSIS — E669 Obesity, unspecified: Secondary | ICD-10-CM | POA: Insufficient documentation

## 2017-11-20 DIAGNOSIS — Z853 Personal history of malignant neoplasm of breast: Secondary | ICD-10-CM | POA: Insufficient documentation

## 2017-11-20 DIAGNOSIS — Z885 Allergy status to narcotic agent status: Secondary | ICD-10-CM | POA: Insufficient documentation

## 2017-11-20 DIAGNOSIS — Z9011 Acquired absence of right breast and nipple: Secondary | ICD-10-CM | POA: Insufficient documentation

## 2017-11-20 DIAGNOSIS — Z923 Personal history of irradiation: Secondary | ICD-10-CM | POA: Insufficient documentation

## 2017-11-20 DIAGNOSIS — Z880 Allergy status to penicillin: Secondary | ICD-10-CM | POA: Insufficient documentation

## 2017-11-20 DIAGNOSIS — Z7951 Long term (current) use of inhaled steroids: Secondary | ICD-10-CM | POA: Insufficient documentation

## 2017-11-20 HISTORY — DX: Headache, unspecified: R51.9

## 2017-11-20 HISTORY — DX: Anxiety disorder, unspecified: F41.9

## 2017-11-20 HISTORY — DX: Nausea with vomiting, unspecified: R11.2

## 2017-11-20 HISTORY — DX: Headache: R51

## 2017-11-20 HISTORY — DX: Other specified postprocedural states: Z98.890

## 2017-11-20 HISTORY — DX: Other allergy status, other than to drugs and biological substances: Z91.09

## 2017-11-20 HISTORY — DX: Dorsalgia, unspecified: M54.9

## 2017-11-20 HISTORY — DX: Adverse effect of unspecified anesthetic, initial encounter: T41.45XA

## 2017-11-20 HISTORY — PX: BREAST REDUCTION SURGERY: SHX8

## 2017-11-20 HISTORY — DX: Other complications of anesthesia, initial encounter: T88.59XA

## 2017-11-20 HISTORY — DX: Other chronic pain: G89.29

## 2017-11-20 SURGERY — BREAST REDUCTION WITH LIPOSUCTION
Anesthesia: General | Site: Breast | Laterality: Left

## 2017-11-20 MED ORDER — DEXAMETHASONE SODIUM PHOSPHATE 4 MG/ML IJ SOLN
INTRAMUSCULAR | Status: DC | PRN
  Administered 2017-11-20: 10 mg via INTRAVENOUS

## 2017-11-20 MED ORDER — ONDANSETRON HCL 4 MG/2ML IJ SOLN
INTRAMUSCULAR | Status: DC | PRN
  Administered 2017-11-20: 4 mg via INTRAVENOUS

## 2017-11-20 MED ORDER — CIPROFLOXACIN IN D5W 400 MG/200ML IV SOLN: 400.0000 mg | INTRAVENOUS | Status: DC

## 2017-11-20 MED ORDER — BUPIVACAINE-EPINEPHRINE 0.25% -1:200000 IJ SOLN
INTRAMUSCULAR | Status: DC | PRN
  Administered 2017-11-20: 12 mL

## 2017-11-20 MED ORDER — MIDAZOLAM HCL 2 MG/2ML IJ SOLN
INTRAMUSCULAR | Status: AC
  Filled 2017-11-20: qty 2

## 2017-11-20 MED ORDER — SODIUM CHLORIDE 0.9 % IV SOLN
INTRAVENOUS | Status: DC | PRN
  Administered 2017-11-20: 50 ug/min via INTRAVENOUS

## 2017-11-20 MED ORDER — FENTANYL CITRATE (PF) 100 MCG/2ML IJ SOLN
INTRAMUSCULAR | Status: AC
  Filled 2017-11-20: qty 2

## 2017-11-20 MED ORDER — LACTATED RINGERS IV SOLN
INTRAVENOUS | Status: DC
  Administered 2017-11-20: 10 mL/h via INTRAVENOUS

## 2017-11-20 MED ORDER — MIDAZOLAM HCL 2 MG/2ML IJ SOLN: 1.0000 mg | INTRAMUSCULAR | Status: DC | PRN

## 2017-11-20 MED ORDER — ONDANSETRON HCL 4 MG/2ML IJ SOLN
INTRAMUSCULAR | Status: AC
  Filled 2017-11-20: qty 2

## 2017-11-20 MED ORDER — LIDOCAINE HCL 1 % IJ SOLN
INTRAVENOUS | Status: DC | PRN
  Administered 2017-11-20: 600 mL

## 2017-11-20 MED ORDER — FENTANYL CITRATE (PF) 100 MCG/2ML IJ SOLN
INTRAMUSCULAR | Status: DC | PRN
  Administered 2017-11-20: 100 ug via INTRAVENOUS

## 2017-11-20 MED ORDER — DEXAMETHASONE SODIUM PHOSPHATE 10 MG/ML IJ SOLN
INTRAMUSCULAR | Status: AC
  Filled 2017-11-20: qty 1

## 2017-11-20 MED ORDER — PROMETHAZINE HCL 25 MG/ML IJ SOLN
INTRAMUSCULAR | Status: AC
  Filled 2017-11-20: qty 1

## 2017-11-20 MED ORDER — SUCCINYLCHOLINE CHLORIDE 20 MG/ML IJ SOLN
INTRAMUSCULAR | Status: DC | PRN
  Administered 2017-11-20: 50 mg via INTRAVENOUS

## 2017-11-20 MED ORDER — VANCOMYCIN HCL IN DEXTROSE 1-5 GM/200ML-% IV SOLN
INTRAVENOUS | Status: AC
  Filled 2017-11-20: qty 200

## 2017-11-20 MED ORDER — LIDOCAINE HCL (CARDIAC) PF 100 MG/5ML IV SOSY
PREFILLED_SYRINGE | INTRAVENOUS | Status: DC | PRN
  Administered 2017-11-20: 100 mg via INTRAVENOUS

## 2017-11-20 MED ORDER — FENTANYL CITRATE (PF) 100 MCG/2ML IJ SOLN
25.0000 ug | INTRAMUSCULAR | Status: DC | PRN
  Administered 2017-11-20 (×2): 50 ug via INTRAVENOUS

## 2017-11-20 MED ORDER — SODIUM CHLORIDE 0.9 % IJ SOLN
INTRAMUSCULAR | Status: AC
  Filled 2017-11-20: qty 10

## 2017-11-20 MED ORDER — MIDAZOLAM HCL 5 MG/5ML IJ SOLN
INTRAMUSCULAR | Status: DC | PRN
  Administered 2017-11-20: 2 mg via INTRAVENOUS

## 2017-11-20 MED ORDER — LIDOCAINE HCL (CARDIAC) PF 100 MG/5ML IV SOSY
PREFILLED_SYRINGE | INTRAVENOUS | Status: AC
  Filled 2017-11-20: qty 5

## 2017-11-20 MED ORDER — DIPHENHYDRAMINE HCL 50 MG/ML IJ SOLN
INTRAMUSCULAR | Status: DC | PRN
  Administered 2017-11-20: 12.5 mg via INTRAVENOUS

## 2017-11-20 MED ORDER — SUCCINYLCHOLINE CHLORIDE 200 MG/10ML IV SOSY
PREFILLED_SYRINGE | INTRAVENOUS | Status: AC
  Filled 2017-11-20: qty 10

## 2017-11-20 MED ORDER — PHENYLEPHRINE HCL 10 MG/ML IJ SOLN
INTRAMUSCULAR | Status: AC
  Filled 2017-11-20: qty 1

## 2017-11-20 MED ORDER — PROMETHAZINE HCL 25 MG/ML IJ SOLN
6.2500 mg | INTRAMUSCULAR | Status: DC | PRN
  Administered 2017-11-20: 6.25 mg via INTRAVENOUS

## 2017-11-20 MED ORDER — FENTANYL CITRATE (PF) 100 MCG/2ML IJ SOLN: 50.0000 ug | INTRAMUSCULAR | Status: DC | PRN

## 2017-11-20 MED ORDER — PROPOFOL 10 MG/ML IV BOLUS
INTRAVENOUS | Status: DC | PRN
  Administered 2017-11-20: 250 mg via INTRAVENOUS

## 2017-11-20 MED ORDER — VANCOMYCIN HCL 1000 MG IV SOLR
INTRAVENOUS | Status: DC | PRN
  Administered 2017-11-20: 1000 mg via INTRAVENOUS

## 2017-11-20 MED ORDER — SCOPOLAMINE 1 MG/3DAYS TD PT72: 1.0000 | MEDICATED_PATCH | Freq: Once | TRANSDERMAL | Status: DC | PRN

## 2017-11-20 MED ORDER — CIPROFLOXACIN IN D5W 400 MG/200ML IV SOLN
INTRAVENOUS | Status: AC
  Filled 2017-11-20: qty 200

## 2017-11-20 MED ORDER — LIDOCAINE HCL (PF) 1 % IJ SOLN
INTRAMUSCULAR | Status: AC
  Filled 2017-11-20: qty 60

## 2017-11-20 SURGICAL SUPPLY — 64 items
BAG DECANTER FOR FLEXI CONT (MISCELLANEOUS) IMPLANT
BINDER BREAST LRG (GAUZE/BANDAGES/DRESSINGS) IMPLANT
BINDER BREAST MEDIUM (GAUZE/BANDAGES/DRESSINGS) IMPLANT
BINDER BREAST XLRG (GAUZE/BANDAGES/DRESSINGS) IMPLANT
BINDER BREAST XXLRG (GAUZE/BANDAGES/DRESSINGS) IMPLANT
BIOPATCH RED 1 DISK 7.0 (GAUZE/BANDAGES/DRESSINGS) IMPLANT
BIOPATCH RED 1IN DISK 7.0MM (GAUZE/BANDAGES/DRESSINGS)
BLADE HEX COATED 2.75 (ELECTRODE) ×3 IMPLANT
BLADE KNIFE PERSONA 10 (BLADE) ×6 IMPLANT
BLADE SURG 15 STRL LF DISP TIS (BLADE) ×1 IMPLANT
BLADE SURG 15 STRL SS (BLADE) ×2
BNDG GAUZE ELAST 4 BULKY (GAUZE/BANDAGES/DRESSINGS) IMPLANT
CANISTER SUCT 1200ML W/VALVE (MISCELLANEOUS) ×3 IMPLANT
CHLORAPREP W/TINT 26ML (MISCELLANEOUS) ×6 IMPLANT
COVER BACK TABLE 60X90IN (DRAPES) ×3 IMPLANT
COVER MAYO STAND STRL (DRAPES) ×3 IMPLANT
DECANTER SPIKE VIAL GLASS SM (MISCELLANEOUS) IMPLANT
DERMABOND ADVANCED (GAUZE/BANDAGES/DRESSINGS) ×4
DERMABOND ADVANCED .7 DNX12 (GAUZE/BANDAGES/DRESSINGS) ×2 IMPLANT
DRAIN CHANNEL 19F RND (DRAIN) IMPLANT
DRAPE LAPAROSCOPIC ABDOMINAL (DRAPES) ×3 IMPLANT
DRSG PAD ABDOMINAL 8X10 ST (GAUZE/BANDAGES/DRESSINGS) ×6 IMPLANT
ELECT BLADE 4.0 EZ CLEAN MEGAD (MISCELLANEOUS) ×3
ELECT REM PT RETURN 9FT ADLT (ELECTROSURGICAL) ×3
ELECTRODE BLDE 4.0 EZ CLN MEGD (MISCELLANEOUS) ×1 IMPLANT
ELECTRODE REM PT RTRN 9FT ADLT (ELECTROSURGICAL) ×1 IMPLANT
EVACUATOR SILICONE 100CC (DRAIN) IMPLANT
GAUZE SPONGE 4X4 12PLY STRL LF (GAUZE/BANDAGES/DRESSINGS) IMPLANT
GLOVE BIO SURGEON STRL SZ 6.5 (GLOVE) ×8 IMPLANT
GLOVE BIO SURGEONS STRL SZ 6.5 (GLOVE) ×4
GLOVE EXAM NITRILE MD LF STRL (GLOVE) ×3 IMPLANT
GOWN STRL REUS W/ TWL LRG LVL3 (GOWN DISPOSABLE) ×4 IMPLANT
GOWN STRL REUS W/TWL LRG LVL3 (GOWN DISPOSABLE) ×8
NDL SAFETY ECLIPSE 18X1.5 (NEEDLE) ×1 IMPLANT
NEEDLE HYPO 18GX1.5 SHARP (NEEDLE) ×2
NEEDLE HYPO 25X1 1.5 SAFETY (NEEDLE) ×3 IMPLANT
NS IRRIG 1000ML POUR BTL (IV SOLUTION) ×3 IMPLANT
PACK BASIN DAY SURGERY FS (CUSTOM PROCEDURE TRAY) ×3 IMPLANT
PAD ALCOHOL SWAB (MISCELLANEOUS) ×3 IMPLANT
PENCIL BUTTON HOLSTER BLD 10FT (ELECTRODE) ×3 IMPLANT
PIN SAFETY STERILE (MISCELLANEOUS) IMPLANT
SLEEVE SCD COMPRESS KNEE MED (MISCELLANEOUS) ×3 IMPLANT
SPONGE LAP 18X18 RF (DISPOSABLE) ×6 IMPLANT
STRIP SUTURE WOUND CLOSURE 1/2 (SUTURE) ×6 IMPLANT
SUT MNCRL AB 4-0 PS2 18 (SUTURE) ×15 IMPLANT
SUT MON AB 3-0 SH 27 (SUTURE) ×2
SUT MON AB 3-0 SH27 (SUTURE) ×1 IMPLANT
SUT MON AB 5-0 PS2 18 (SUTURE) ×6 IMPLANT
SUT PDS 3-0 CT2 (SUTURE)
SUT PDS AB 2-0 CT2 27 (SUTURE) IMPLANT
SUT PDS II 3-0 CT2 27 ABS (SUTURE) IMPLANT
SUT SILK 3 0 PS 1 (SUTURE) IMPLANT
SYR 3ML 23GX1 SAFETY (SYRINGE) ×3 IMPLANT
SYR 50ML LL SCALE MARK (SYRINGE) ×3 IMPLANT
SYR BULB IRRIGATION 50ML (SYRINGE) ×3 IMPLANT
SYR CONTROL 10ML LL (SYRINGE) ×3 IMPLANT
TAPE MEASURE VINYL STERILE (MISCELLANEOUS) ×3 IMPLANT
TOWEL GREEN STERILE FF (TOWEL DISPOSABLE) ×6 IMPLANT
TUBE CONNECTING 20'X1/4 (TUBING) ×1
TUBE CONNECTING 20X1/4 (TUBING) ×2 IMPLANT
TUBING INFILTRATION IT-10001 (TUBING) ×3 IMPLANT
TUBING SET GRADUATE ASPIR 12FT (MISCELLANEOUS) ×3 IMPLANT
UNDERPAD 30X30 (UNDERPADS AND DIAPERS) ×6 IMPLANT
YANKAUER SUCT BULB TIP NO VENT (SUCTIONS) ×3 IMPLANT

## 2017-11-20 NOTE — Anesthesia Preprocedure Evaluation (Addendum)
Anesthesia Evaluation  Patient identified by MRN, date of birth, ID band Patient awake    Reviewed: Allergy & Precautions, NPO status , Patient's Chart, lab work & pertinent test results  History of Anesthesia Complications (+) PONV and history of anesthetic complications  Airway Mallampati: II  TM Distance: >3 FB Neck ROM: Full    Dental  (+) Teeth Intact, Dental Advisory Given   Pulmonary former smoker,    Pulmonary exam normal breath sounds clear to auscultation       Cardiovascular hypertension, Pt. on medications Normal cardiovascular exam Rhythm:Regular Rate:Normal     Neuro/Psych  Headaches, PSYCHIATRIC DISORDERS Anxiety    GI/Hepatic negative GI ROS, Neg liver ROS,   Endo/Other  Obesity   Renal/GU negative Renal ROS     Musculoskeletal  (+) Arthritis ,   Abdominal   Peds  Hematology negative hematology ROS (+)   Anesthesia Other Findings Day of surgery medications reviewed with the patient.  Malignant neoplasm of lower-outer quadrant of right breast of female, estrogen receptor positive, Postoperative breast asymmetry  Reproductive/Obstetrics                            Anesthesia Physical Anesthesia Plan  ASA: II  Anesthesia Plan: General   Post-op Pain Management:    Induction: Intravenous  PONV Risk Score and Plan: 4 or greater and Midazolam, Dexamethasone and Ondansetron  Airway Management Planned: LMA  Additional Equipment:   Intra-op Plan:   Post-operative Plan: Extubation in OR  Informed Consent: I have reviewed the patients History and Physical, chart, labs and discussed the procedure including the risks, benefits and alternatives for the proposed anesthesia with the patient or authorized representative who has indicated his/her understanding and acceptance.   Dental advisory given  Plan Discussed with: CRNA  Anesthesia Plan Comments:          Anesthesia Quick Evaluation

## 2017-11-20 NOTE — Transfer of Care (Signed)
Immediate Anesthesia Transfer of Care Note  Patient: Maliha Christiano  Procedure(s) Performed: left breast reduction for symmetry and liposuction bilateral lateral breasts (Left Breast)  Patient Location: PACU  Anesthesia Type:General  Level of Consciousness: awake and sedated  Airway & Oxygen Therapy: Patient Spontanous Breathing and Patient connected to face mask oxygen  Post-op Assessment: Report given to RN and Post -op Vital signs reviewed and stable  Post vital signs: Reviewed and stable  Last Vitals:  Vitals Value Taken Time  BP 146/87 11/20/2017  3:08 PM  Temp    Pulse 91 11/20/2017  3:09 PM  Resp 27 11/20/2017  3:09 PM  SpO2 99 % 11/20/2017  3:09 PM  Vitals shown include unvalidated device data.  Last Pain:  Vitals:   11/20/17 1137  TempSrc: Oral  PainSc: 0-No pain      Patients Stated Pain Goal: 0 (06/21/34 1224)  Complications: No apparent anesthesia complications

## 2017-11-20 NOTE — Discharge Instructions (Signed)
May shower on Saturday Continue breast binder or sports bra     Post Anesthesia Home Care Instructions  Activity: Get plenty of rest for the remainder of the day. A responsible individual must stay with you for 24 hours following the procedure.  For the next 24 hours, DO NOT: -Drive a car -Paediatric nurse -Drink alcoholic beverages -Take any medication unless instructed by your physician -Make any legal decisions or sign important papers.  Meals: Start with liquid foods such as gelatin or soup. Progress to regular foods as tolerated. Avoid greasy, spicy, heavy foods. If nausea and/or vomiting occur, drink only clear liquids until the nausea and/or vomiting subsides. Call your physician if vomiting continues.  Special Instructions/Symptoms: Your throat may feel dry or sore from the anesthesia or the breathing tube placed in your throat during surgery. If this causes discomfort, gargle with warm salt water. The discomfort should disappear within 24 hours.  If you had a scopolamine patch placed behind your ear for the management of post- operative nausea and/or vomiting:  1. The medication in the patch is effective for 72 hours, after which it should be removed.  Wrap patch in a tissue and discard in the trash. Wash hands thoroughly with soap and water. 2. You may remove the patch earlier than 72 hours if you experience unpleasant side effects which may include dry mouth, dizziness or visual disturbances. 3. Avoid touching the patch. Wash your hands with soap and water after contact with the patch.

## 2017-11-20 NOTE — Op Note (Signed)
DATE OF OPERATION: 11/20/2017  LOCATION: [facility]  PREOPERATIVE DIAGNOSIS: bilateral breast asymmetry  POSTOPERATIVE DIAGNOSIS: Same  PROCEDURE: left breast reduction 400 gm with right lateral liposuction for symmetry  SURGEON: Marvelous Bouwens [place] Jamelah Sitzer, DO  ASSISTANT: Shawn Rayburn, PA  EBL: 10 cc  CONDITION: Stable  COMPLICATIONS: None  INDICATION: The patient, Kimberly Mann, is a 62 y.o. female born on 29-Jan-1956, is here for treatment of breast asymmetry after treatment for breast cancer and right breast radiation.   PROCEDURE DETAILS:  The patient was seen prior to surgery and marked.  The IV antibiotics were given. The patient was taken to the operating room and given a general anesthetic. A standard time out was performed and all information was confirmed by those in the room. SCDs were placed.   The breasts were prepped and draped. The local was injected at the inframammary scar laterally.  The #15 blade was used to make a small incision and the tumescent was infused.  The premarked area of the left breast was confirmed.  The cookie cutter was used to mark the nipple areola.  The #10 blade was used to de-epithelialize the skin from the left breast premarked area.  Attention was given to not violate the inferior pedicle from the previous surgery.  The vertical limb was de-epithelialized.  The bovie was used to create an area for suturing of the NAC superiorly and the vertical limb closed.  This was closed with the 3-0 Monocryl.  The deep layers were then closed with the 4-0 Monocryl.  The 5-0 Monocryl was used to close the skin with a subcuticular closure.  The liposuction was then done on the lateral aspect with 400 cc removed.  Attention was then turned to the right breast.  The #15 blade was used to make a small incision in the inframammary fold.  The tumescent was placed.  After waiting for the local to take effect liposuction was performed.  There was 200 cc  removed from the lateral aspect.  Attention was given to no violate the inferior pedicle of the flap.  The incision was closed with the 5-0 Monocryl.  Derma bond was applied with steri strips to both breasts. The patient was placed in a breast binder.  The patient was allowed to wake up and taken to recovery room in stable condition at the end of the case. The family was notified at the end of the case.

## 2017-11-20 NOTE — Interval H&P Note (Signed)
History and Physical Interval Note:  11/20/2017 1:13 PM  Kimberly Mann  has presented today for surgery, with the diagnosis of Malignant neoplasm of lower-outer quadrant of right breast of female, estrogen receptor positive, Postoperative breast asymmetry  The various methods of treatment have been discussed with the patient and family. After consideration of risks, benefits and other options for treatment, the patient has consented to  Procedure(s): left breast reduction for symmetry and liposuction bilateral lateral breasts (Left) as a surgical intervention .  The patient's history has been reviewed, patient examined, no change in status, stable for surgery.  I have reviewed the patient's chart and labs.  Questions were answered to the patient's satisfaction.     Loel Lofty Thoms Barthelemy

## 2017-11-20 NOTE — H&P (Signed)
Kimberly Mann is an 62 y.o. female.   Chief Complaint: breast asymmetry HPI: Kimberly Mann is a 62 yo female. She will undergo a left breast reduction and liposuction bilateral lateral breasts later this week for improved symmetry following right partial mastectomy with radiation in 2018. She underwent a bilateral breast reduction ~ 2008 which was likely an inferior pedicle.  She then had a right partial mastectomy with postop radiation in 2018.  She is 5 feet 5 inches tall, weight is 237 pounds and bra is 44 C.  She has severe asymmetry with the left breast larger than the right.  She would like better symmetry.  The right breast has good shape and normal ptosis for her age.  She had an abdominoplasty and liposuction in the past as well.   Past Medical History:  Diagnosis Date  . Anxiety   . Cancer (HCC)    breast  . Chronic back pain    takes oxycodone  . Complication of anesthesia   . Environmental allergies   . Headache   . Hypertension   . PONV (postoperative nausea and vomiting)   . Vertigo     Past Surgical History:  Procedure Laterality Date  . BREAST SURGERY     rt breast lumpectomy  . LIPOSUCTION    . TUBAL LIGATION      Family History  Problem Relation Age of Onset  . Hypertension Mother    Social History:  reports that she quit smoking about 43 years ago. She has never used smokeless tobacco. She reports that she drinks about 4.2 oz of alcohol per week. She reports that she does not use drugs.  Allergies:  Allergies  Allergen Reactions  . Morphine And Related Nausea And Vomiting and Swelling  . Penicillins   . Sulfa Antibiotics     No medications prior to admission.    Results for orders placed or performed during the hospital encounter of 11/20/17 (from the past 48 hour(s))  Basic metabolic panel     Status: None   Collection Time: 11/18/17  2:00 PM  Result Value Ref Range   Sodium 141 135 - 145 mmol/L   Potassium 4.3 3.5 - 5.1 mmol/L   Chloride 107 98 - 111 mmol/L   CO2 25 22 - 32 mmol/L   Glucose, Bld 92 70 - 99 mg/dL   BUN 17 8 - 23 mg/dL   Creatinine, Ser 0.79 0.44 - 1.00 mg/dL   Calcium 9.1 8.9 - 10.3 mg/dL   GFR calc non Af Amer >60 >60 mL/min   GFR calc Af Amer >60 >60 mL/min    Comment: (NOTE) The eGFR has been calculated using the CKD EPI equation. This calculation has not been validated in all clinical situations. eGFR's persistently <60 mL/min signify possible Chronic Kidney Disease.    Anion gap 9 5 - 15    Comment: Performed at [facility] [address] [place], [place] [address]   No results found.  Review of Systems  Constitutional: Negative.   HENT: Negative.   Skin: Negative.     Height _0  (1.651 m), weight 106.6 kg (235 lb). Physical Exam  Constitutional: She is oriented to person, place, and time. She appears well-developed and well-nourished.  HENT:  Head: Normocephalic and atraumatic.  Eyes: Pupils are equal, round, and reactive to light. EOM are normal.  Cardiovascular: Normal rate.  Respiratory: Effort normal.  GI: Soft.  Musculoskeletal: Normal range of motion.  Neurological: She is alert  and oriented to person, place, and time.  Skin: Skin is warm.  Psychiatric: She has a normal mood and affect. Her behavior is normal. Thought content normal.     Assessment/Plan Plan: left breast reduction and liposuction bilateral lateral breasts for improved symmetry following right partial mastectomy with radiation in 2018  [place], DO 11/20/2017, 9:11 AM

## 2017-11-20 NOTE — Op Note (Signed)
First Assist Op Note: [facility] I assisted the Surgeon(s) __Dr. Lyndee Leo Dillingham_____ on the procedure(s): _____Left breast reduction and right lateral breast liposuction for symmetry___________on Date _7/25/2019________  I provided my assistance on this case as follows:  I was present and acted as first Environmental consultant during this operation. I was present during the patient transport into the operative suite and assisted the OR staff with transferring and positioning of the patient. All extremities were checked and properly cushioned and safety straps in place. I was involved in the prepping and placement of sterile drapes. A time out was performed and all information confirmed to be correct.  I first assisted during the case including retraction for exposure, assisting with closure of surgical wounds and application of sterile dressings. I provided assistance with application of post operative garments/splinting and assisted with patient transfer back to the stretcher as needed.   Sakura Denis,PA-C Plastic Surgery [phone]

## 2017-11-20 NOTE — Anesthesia Procedure Notes (Signed)
Procedure Name: Intubation Date/Time: 11/20/2017 1:28 PM Performed by: Marrianne Mood, CRNA Pre-anesthesia Checklist: Patient identified, Emergency Drugs available, Suction available, Patient being monitored and Timeout performed Patient Re-evaluated:Patient Re-evaluated prior to induction Oxygen Delivery Method: Circle system utilized Preoxygenation: Pre-oxygenation with 100% oxygen Induction Type: IV induction Ventilation: Mask ventilation without difficulty Laryngoscope Size: Miller and 3 Grade View: Grade II Tube type: Oral Tube size: 7.0 mm Number of attempts: 1 Airway Equipment and Method: Stylet and Oral airway Placement Confirmation: ETT inserted through vocal cords under direct vision,  positive ETCO2 and breath sounds checked- equal and bilateral Secured at: 22 cm Tube secured with: Tape Dental Injury: Teeth and Oropharynx as per pre-operative assessment

## 2017-11-21 ENCOUNTER — Encounter (HOSPITAL_BASED_OUTPATIENT_CLINIC_OR_DEPARTMENT_OTHER): Payer: Self-pay | Admitting: Plastic Surgery

## 2017-11-21 NOTE — Anesthesia Postprocedure Evaluation (Signed)
Anesthesia Post Note  Patient: Luvada Samford  Procedure(s) Performed: left breast reduction for symmetry and liposuction bilateral lateral breasts (Left Breast)     Patient location during evaluation: PACU Anesthesia Type: General Level of consciousness: awake and alert Pain management: pain level controlled Vital Signs Assessment: post-procedure vital signs reviewed and stable Respiratory status: spontaneous breathing, nonlabored ventilation and respiratory function stable Cardiovascular status: blood pressure returned to baseline and stable Postop Assessment: no apparent nausea or vomiting Anesthetic complications: no    Last Vitals:  Vitals:   11/20/17 1622 11/20/17 1635  BP:  137/84  Pulse: 84 75  Resp: 12 16  Temp:  36.6 C  SpO2: 98% 100%    Last Pain:  Vitals:   11/20/17 1635  TempSrc:   PainSc: 4                  Catalina Gravel

## 2017-11-25 ENCOUNTER — Other Ambulatory Visit: Payer: Self-pay | Admitting: Neurology

## 2017-11-25 DIAGNOSIS — M545 Low back pain, unspecified: Secondary | ICD-10-CM

## 2017-11-25 MED ORDER — OXYCODONE-ACETAMINOPHEN 10-325 MG PO TABS: 1.0000 | ORAL_TABLET | Freq: Four times a day (QID) | ORAL | 0 refills | Status: DC | PRN

## 2017-11-25 NOTE — Telephone Encounter (Signed)
Rx registry checked. Oxycodone last filled on 10/29/17 for #120. She also had a prescription filled for Hydrocodone #28 on 11/19/17, prescribed by a Shawn Rayburn. Her next OV is on 12/11/17.

## 2017-11-25 NOTE — Telephone Encounter (Signed)
Pt request refill for oxyCODONE-acetaminophen (PERCOCET) 10-325 MG tablet

## 2017-11-26 NOTE — Telephone Encounter (Signed)
Rx signed and ready at the front desk. I called and LM making patient aware.

## 2017-12-11 ENCOUNTER — Other Ambulatory Visit: Payer: Self-pay

## 2017-12-11 ENCOUNTER — Encounter: Payer: Self-pay | Admitting: Neurology

## 2017-12-11 ENCOUNTER — Ambulatory Visit: Payer: Medicare HMO | Admitting: Neurology

## 2017-12-11 ENCOUNTER — Encounter

## 2017-12-11 ENCOUNTER — Ambulatory Visit (INDEPENDENT_AMBULATORY_CARE_PROVIDER_SITE_OTHER): Payer: Medicare HMO | Admitting: Neurology

## 2017-12-11 DIAGNOSIS — G8929 Other chronic pain: Secondary | ICD-10-CM

## 2017-12-11 DIAGNOSIS — M7062 Trochanteric bursitis, left hip: Secondary | ICD-10-CM

## 2017-12-11 DIAGNOSIS — M7550 Bursitis of unspecified shoulder: Secondary | ICD-10-CM

## 2017-12-11 DIAGNOSIS — M542 Cervicalgia: Secondary | ICD-10-CM

## 2017-12-11 DIAGNOSIS — M7061 Trochanteric bursitis, right hip: Secondary | ICD-10-CM

## 2017-12-11 DIAGNOSIS — M545 Low back pain, unspecified: Secondary | ICD-10-CM

## 2017-12-11 DIAGNOSIS — C50919 Malignant neoplasm of unspecified site of unspecified female breast: Secondary | ICD-10-CM

## 2017-12-11 MED ORDER — MELOXICAM 15 MG PO TABS: 15.0000 mg | ORAL_TABLET | Freq: Every day | ORAL | 5 refills | Status: DC

## 2017-12-11 MED ORDER — DICLOFENAC SODIUM 1 % TD GEL: 2.0000 g | Freq: Four times a day (QID) | TRANSDERMAL | 5 refills | Status: DC

## 2017-12-11 MED ORDER — OXYCODONE-ACETAMINOPHEN 10-325 MG PO TABS: 1.0000 | ORAL_TABLET | Freq: Four times a day (QID) | ORAL | 0 refills | Status: DC | PRN

## 2017-12-11 MED ORDER — FLUCONAZOLE 150 MG PO TABS: 150.0000 mg | ORAL_TABLET | Freq: Every day | ORAL | 0 refills | Status: DC

## 2017-12-11 NOTE — Progress Notes (Signed)
t  [facility]  PATIENT: Kimberly Mann DOB: Feb 01, 1956  REFERRING CLINICIAN: [name]  HISTORY FROM: Patient  REASON FOR VISIT: Left shoulder and neck pain; back pain [  HISTORICAL  CHIEF COMPLAINT:  Chief Complaint  Patient presents with  . Neck Pain    Hx. of breast cancer; recently had reconstructive surgery both breasts, so doesn't think she should have tpi's for neck/hip pain today, but would like r/f's of Voltaren gel, Meloxicam/fim  . Hip Pain    HISTORY OF PRESENT ILLNESS:  Kimberly Mann is a 62 yo woman with neck, back, hip and shoulder pain.  Update 12/11/2017: She had recent reconstructive surgery after breast cancer and is more sore in general (Dr. Doreen Salvage;  Also sees Dr. [facility]).   She is on tamoxifen and did RadRx.      She has pain everywhere.   She has pain in both hips and that is her worse pain.   Trochanteric bursa injections have helped in the past.   Due to her recent surgery, she would prefer to hold off on any injections today.     She also notes neck pain, worse with movements.   She has multilevel DJD C3-C6.       Update 08/05/2017: She is reporting more hip pain, bilaterally.    Pain increases if she lays on that side.  Trochanteric injections ususally help for a few months.    She also has back pain.  She has milder neck pain, worse with tirning he head.   Shoulders are only mildly tender  Update 06/04/2017: Neck, back and shoulder pain improved after the trigger point injections in October but have returned over the last month.      MRI cervical spine showed DJD at C3C4-C5C6.    Her worse pain is in the left shoulder.   Pain increases if she lays on that side.    She also has right shoulder region pain.   She also has neck pain that increases as the day goes on.   Her LB is hurting but not as bad as the neck and shoulders.   Hipas are hurting a lot over the bursae.    Bursa injections have helped in the past.    She  sees Dr. Harlow Asa for breast cancer.   She had surgery and radiation and will be starting a pill x 5 days  Update 01/30/2017:    Since the last visit, her neck pain has greatly increased. She is undergoing radiation for breast cancer and she notes that laying down on the table is painful for her.   The pain is in the left neck more than the right. Additionally she is noticing more lower back pain also little worse on the left than the right. She denies any change in strength or sensation in the arms or legs. There is no change in her bladder.    Her hip and shoulder pain improved quite a bit after the joint injections at her last visit. ________________________________ From 01/30/2017 LBP/hip pain:   She reports bilateral lower back pain and pain that is in the buttock and hip region, generally worse on the right.   Pain does not radiate into her legs. Pain is worse with walking and with standing up from a seated position.  In the past, trochanteric bursa injection has helped.   Neck/Shoulder pain:   She reports pain in the neck and shoulders, worse on her right in the neck but in  the left > right shulder.    Pain is worse with arms elevated,    Her pain also increases with external rotation and elevation.  Subacromial bursa injection often helped in the past.   However, she has had some itching following the steroid shots and it was especially bad after the last one 3-4 months ago.    ROM is mildly reduced in shoulders.    No arm weakness.   Breast cancer:   She needed surgery x 2 and is currently on RadRx for breast cancer.       Med's:  For her pain she is on Percocet 4 times a day with benefit.  Sheis also on meloxicam and tizanidine.   She has not shown any drug-seeking behavior       Migraine headaches occur about once a week. If Percocet does not help the migraine she will take a meloxicam.    REVIEW OF SYSTEMS:  Constitutional: No fevers, chills, sweats, or change in appetite Eyes: No visual  changes, double vision, eye pain Ear, nose and throat: No hearing loss, ear pain, nasal congestion, sore throat Cardiovascular: No chest pain, palpitations Respiratory:  No shortness of breath at rest or with exertion.   No wheezes GastrointestinaI: No nausea, vomiting, diarrhea, abdominal pain, fecal incontinence Genitourinary:  No dysuria, urinary retention or frequency.  No nocturia. Musculoskeletal: as above Integumentary: No rash, pruritus, skin lesions Neurological: as above Psychiatric: She has som depression since breast cancer dx.  Endocrine: No palpitations, diaphoresis, change in appetite, change in weigh or increased thirst  ALLERGIES: Allergies  Allergen Reactions  . Morphine And Related Nausea And Vomiting and Swelling  . Penicillins   . Sulfa Antibiotics     HOME MEDICATIONS: Outpatient Medications Prior to Visit  Medication Sig Dispense Refill  . amLODipine (NORVASC) 5 MG tablet Take 5 mg by mouth daily.      . cetirizine-pseudoephedrine (ZYRTEC-D) 5-120 MG tablet Take 1 tablet by mouth 2 (two) times daily. 30 tablet 0  . fluticasone (FLONASE) 50 MCG/ACT nasal spray Place 2 sprays into both nostrils daily. 48 g 1  . hydrOXYzine (VISTARIL) 25 MG capsule Take 1 capsule (25 mg total) by mouth 3 (three) times daily as needed. 60 capsule 0  . loratadine (CLARITIN) 10 MG tablet     . Multiple Vitamins-Minerals (MULTIVITAMIN ADULT PO)     . promethazine (PHENERGAN) 25 MG tablet     . tamoxifen (NOLVADEX) 20 MG tablet Take 20 mg by mouth daily.    Marland Kitchen triamterene-hydrochlorothiazide (MAXZIDE-25) 37.5-25 MG tablet Take 1 tablet by mouth daily. 90 tablet 1  . diclofenac sodium (VOLTAREN) 1 % GEL Apply 2 g topically 4 (four) times daily. 300 g 5  . fluconazole (DIFLUCAN) 150 MG tablet Take 1 tablet (150 mg total) by mouth daily. 15 tablet 0  . meloxicam (MOBIC) 15 MG tablet Take 1 tablet (15 mg total) by mouth daily. 30 tablet 5  . oxyCODONE-acetaminophen (PERCOCET) 10-325 MG  tablet Take 1 tablet by mouth every 6 (six) hours as needed for pain. 120 tablet 0   No facility-administered medications prior to visit.     PAST MEDICAL HISTORY: Past Medical History:  Diagnosis Date  . Anxiety   . Cancer (HCC)    breast  . Chronic back pain    takes oxycodone  . Complication of anesthesia   . Environmental allergies   . Headache   . Hypertension   . PONV (postoperative nausea and vomiting)   .  Vertigo     PAST SURGICAL HISTORY: Past Surgical History:  Procedure Laterality Date  . BREAST REDUCTION SURGERY Left 11/20/2017   Procedure: left breast reduction for symmetry and liposuction bilateral lateral breasts;  Surgeon: Wallace Going, DO;  Location: [facility];  Service: Plastics;  Laterality: Left;  . BREAST SURGERY     rt breast lumpectomy  . LIPOSUCTION    . TUBAL LIGATION      FAMILY HISTORY: Family History  Problem Relation Age of Onset  . Hypertension Mother     SOCIAL HISTORY:  Social History   Socioeconomic History  . Marital status: Legally Separated    Spouse name: Not on file  . Number of children: Not on file  . Years of education: Not on file  . Highest education level: Not on file  Occupational History  . Not on file  Social Needs  . Financial resource strain: Not on file  . Food insecurity:    Worry: Not on file    Inability: Not on file  . Transportation needs:    Medical: Not on file    Non-medical: Not on file  Tobacco Use  . Smoking status: Former Smoker    Last attempt to quit: 05/17/1974    Years since quitting: 43.6  . Smokeless tobacco: Never Used  Substance and Sexual Activity  . Alcohol use: Yes    Alcohol/week: 7.0 standard drinks    Types: 7 Standard drinks or equivalent per week    Comment: one glass of wine with dinner/fim  . Drug use: No    Types: Marijuana  . Sexual activity: Not Currently  Lifestyle  . Physical activity:    Days per week: Not on file    Minutes per session:  Not on file  . Stress: Not on file  Relationships  . Social connections:    Talks on phone: Not on file    Gets together: Not on file    Attends religious service: Not on file    Active member of club or organization: Not on file    Attends meetings of clubs or organizations: Not on file    Relationship status: Not on file  . Intimate partner violence:    Fear of current or ex partner: Not on file    Emotionally abused: Not on file    Physically abused: Not on file    Forced sexual activity: Not on file  Other Topics Concern  . Not on file  Social History Narrative  . Not on file     PHYSICAL EXAM  There were no vitals filed for this visit.  There is no height or weight on file to calculate BMI.   General: The patient is well-developed and well-nourished and in no acute distress   Musculoskeletal:    She has tenderness over the subacromial bursae, paraspinal muscles in the neck and lower back and over the trochanteric bursae.    Neurologic Exam  Mental status: The patient is alert and oriented x 3 at the time of the examination. The patient has apparent normal recent and remote memory, with an apparently normal attention span and concentration ability.    Cranial nerves:    Facial strength is normal.  Trapezius strength is normal.  No dysarthria is noted.    No obvious hearing deficits are noted.  Motor:  Muscle bulk and tone are normal. Strength is 5/5 in the arms or legs.   Gait and station: Station is normal.  The gait is arthritic.  Tandem gait is mildly wide.  DTRs:   Reflexes are normal and symmetric in the arms and legs.     DIAGNOSTIC DATA (LABS, IMAGING, TESTING) - I reviewed patient records, labs, notes, testing and imaging myself where available.     ASSESSMENT AND PLAN   Trochanteric bursitis of left hip  Back pain, lumbosacral - Plan: oxyCODONE-acetaminophen (PERCOCET) 10-325 MG tablet  Trochanteric bursitis, right hip  Subacromial  bursitis  Neck pain  Chronic midline low back pain without sciatica  Malignant neoplasm of female breast, unspecified estrogen receptor status, unspecified laterality, unspecified site of breast ([place])   1.   She would like to hold off on trigger point injections today as she just had surgery.   2.   Renew Percocet.  The [place] controlled substance database was reviewed.  Continue other medications. 3.   Try to stay active and exercises tolerated. 4.    Renew meloxicam and diclofenac gel.  5.     She will return to see me in 4 months, sooner if she  has new or worsening neurologic symptoms.    Tava Peery A. Felecia Shelling, MD, PhD 11/24/2058, 1:56 PM Certified in Neurology, [place] [facility], Sleep Medicine, Pain Medicine and Neuroimaging  [facility] [address] [place], [place] [address] [phone]

## 2018-01-05 ENCOUNTER — Other Ambulatory Visit: Payer: Self-pay | Admitting: Family Medicine

## 2018-01-22 ENCOUNTER — Ambulatory Visit (INDEPENDENT_AMBULATORY_CARE_PROVIDER_SITE_OTHER): Payer: Medicare HMO | Admitting: Family Medicine

## 2018-01-22 ENCOUNTER — Encounter: Payer: Self-pay | Admitting: Family Medicine

## 2018-01-22 ENCOUNTER — Telehealth: Payer: Self-pay | Admitting: Neurology

## 2018-01-22 VITALS — BP 133/69 | HR 95 | Temp 97.6°F | Ht 65.0 in | Wt 235.4 lb

## 2018-01-22 DIAGNOSIS — M545 Low back pain, unspecified: Secondary | ICD-10-CM

## 2018-01-22 DIAGNOSIS — R7303 Prediabetes: Secondary | ICD-10-CM

## 2018-01-22 DIAGNOSIS — J302 Other seasonal allergic rhinitis: Secondary | ICD-10-CM

## 2018-01-22 DIAGNOSIS — I10 Essential (primary) hypertension: Secondary | ICD-10-CM

## 2018-01-22 DIAGNOSIS — L309 Dermatitis, unspecified: Secondary | ICD-10-CM

## 2018-01-22 LAB — LIPID PANEL
CHOLESTEROL: 222 mg/dL — AB (ref 0–200)
HDL: 47.1 mg/dL (ref >39.00)
LDL CALC: 139 mg/dL — AB (ref 0–99)
NonHDL: 174.76
Total CHOL/HDL Ratio: 5
Triglycerides: 179 mg/dL — ABNORMAL HIGH (ref 0.0–149.0)
VLDL: 35.8 mg/dL (ref 0.0–40.0)

## 2018-01-22 LAB — HEMOGLOBIN A1C: Hgb A1c MFr Bld: 6 % (ref 4.6–6.5)

## 2018-01-22 MED ORDER — OXYCODONE-ACETAMINOPHEN 10-325 MG PO TABS: 1.0000 | ORAL_TABLET | Freq: Four times a day (QID) | ORAL | 0 refills | Status: DC | PRN

## 2018-01-22 NOTE — Addendum Note (Signed)
Addended by: France Ravens I on: 01/22/2018 03:02 PM   Modules accepted: Orders

## 2018-01-22 NOTE — Patient Instructions (Addendum)
If you do not hear anything about your referrals in the next 1-2 weeks, call our office and ask for an update.  Claritin (loratadine), Allegra (fexofenadine), Zyrtec (cetirizine); these are listed in order from weakest to strongest. Generic, and therefore cheaper, options are in the parentheses.   Stay on the nasal spray.  There are available OTC, and the generic versions, which may be cheaper, are in parentheses. Show this to a pharmacist if you have trouble finding any of these items.  Stay active and keep the diet clean.  Let me know if the new cream is not helpful and we can try another course of steroids to help with the itching.  Let us know if you need anything.

## 2018-01-22 NOTE — Telephone Encounter (Signed)
Pt has called for a refill prescription for oxyCODONE-acetaminophen (PERCOCET) 10-325 MG tablet

## 2018-01-22 NOTE — Addendum Note (Signed)
Addended by: Caffie Pinto on: 01/22/2018 12:36 PM   Modules accepted: Orders

## 2018-01-22 NOTE — Addendum Note (Signed)
Addended by: Caffie Pinto on: 01/22/2018 11:47 AM   Modules accepted: Orders

## 2018-01-22 NOTE — Progress Notes (Signed)
Chief Complaint  Patient presents with  . Follow-up    Pt here to 6 month f/u visit. Declined flu vaccine.     Subjective Kimberly Mann is a 62 y.o. female who presents for hypertension follow up. She does monitor home blood pressures. Blood pressures ranging from 130's/80's on average. She is compliant with medications- maxide and Norvasc. Patient has these side effects of medication: none She is adhering to a healthy diet overall. Current exercise: walking  +hx of allergies. Interested in shots. Has been using INCS.   Itchy rash after breast reconstruction. Pred has been helpful. Has not been on topicals. Requesting to see derm.    Past Medical History:  Diagnosis Date  . Anxiety   . Cancer (HCC)    breast  . Chronic back pain    takes oxycodone  . Complication of anesthesia   . Environmental allergies   . Headache   . Hypertension   . PONV (postoperative nausea and vomiting)   . Vertigo     Review of Systems Cardiovascular: no chest pain Respiratory:  no shortness of breath  Exam BP 133/69 (BP Location: Left Arm, Patient Position: Sitting, Cuff Size: Large)   Pulse 95   Temp 97.6 F (36.4 C) (Oral)   Ht 5\' 5"  (1.651 m)   Wt 235 lb 6.4 oz (106.8 kg)   SpO2 99%   BMI 39.17 kg/m  General:  well developed, well nourished, in no apparent distress HEENT: ears neg, nares patent, pharynx neg, mmm Heart: RRR, no bruits, no LE edema Lungs: clear to auscultation, no accessory muscle use Skin: Hyperpigmentation and scaling peri-areolar. Psych: well oriented with normal range of affect and appropriate judgment/insight  Seasonal allergies - Plan: Ambulatory referral to Allergy  Essential hypertension - Plan: triamterene-hydrochlorothiazide (MAXZIDE-25) 37.5-25 MG tablet  Dermatitis - Plan: Ambulatory referral to Dermatology  Orders as above. Counseled on diet and exercise.  F/u in 6 mo for CPE. The patient voiced understanding and agreement to the  plan.  Roberts, DO 01/22/18  11:37 AM

## 2018-01-22 NOTE — Telephone Encounter (Signed)
Rx. awaiting YY sig, as RAS is ooo today/fim

## 2018-01-22 NOTE — Telephone Encounter (Signed)
Rx. up front [facility]/fim 

## 2018-01-23 ENCOUNTER — Telehealth: Payer: Self-pay | Admitting: Family Medicine

## 2018-01-23 ENCOUNTER — Encounter: Payer: Self-pay | Admitting: Family Medicine

## 2018-01-23 MED ORDER — TRIAMCINOLONE ACETONIDE 0.5 % EX CREA: TOPICAL_CREAM | CUTANEOUS | 0 refills | Status: DC

## 2018-01-23 NOTE — Telephone Encounter (Signed)
Can you change that cream to the [facility] please?

## 2018-01-23 NOTE — Telephone Encounter (Signed)
Copied from [place] [phone]. Topic: General - Other >> Jan 23, 2018  2:03 PM Lennox Solders wrote: Reason for CRM: pt is calling and saw dr wendling yesterday. Pt is still waiting on the new cream for her breast due to itching. [facility] on [address] in [place]

## 2018-02-18 NOTE — Progress Notes (Signed)
The patient had and was treated for breast cancer with a partial mastectomy.  The reduction surgery was for symmetry after the cancer surgery.  I am aware that 19318 is for breast reduction.  There is another CPT code for breast reduction after breast cancer.  Are you coding this for the hospital portion?  Or is this code for the surgeon / provider portion?

## 2018-02-23 ENCOUNTER — Telehealth: Payer: Self-pay | Admitting: Neurology

## 2018-02-23 DIAGNOSIS — M545 Low back pain, unspecified: Secondary | ICD-10-CM

## 2018-02-23 MED ORDER — OXYCODONE-ACETAMINOPHEN 10-325 MG PO TABS: 1.0000 | ORAL_TABLET | Freq: Four times a day (QID) | ORAL | 0 refills | Status: DC | PRN

## 2018-02-23 NOTE — Telephone Encounter (Signed)
Patient requesting refill of oxyCODONE-acetaminophen (PERCOCET) 10-325 MG tablet.

## 2018-02-23 NOTE — Telephone Encounter (Signed)
Checked drug registry. She last refilled 01/30/18 #120. Not receiving from other MD's. Last seen 12/11/17 and next f/u 04/16/18. rx printed,waiting on MD signature

## 2018-02-23 NOTE — Telephone Encounter (Signed)
Placed printed/signed rx up front for patient pick up.  

## 2018-02-23 NOTE — Addendum Note (Signed)
Addended by: Rossie Muskrat L on: 02/23/2018 01:07 PM   Modules accepted: Orders

## 2018-02-27 ENCOUNTER — Ambulatory Visit: Payer: Medicare HMO | Admitting: Allergy

## 2018-03-20 ENCOUNTER — Encounter: Payer: Self-pay | Admitting: Allergy

## 2018-03-20 ENCOUNTER — Ambulatory Visit (INDEPENDENT_AMBULATORY_CARE_PROVIDER_SITE_OTHER): Payer: Medicare HMO | Admitting: Allergy

## 2018-03-20 VITALS — BP 142/104 | HR 83 | Temp 97.6°F | Resp 16 | Ht 64.84 in | Wt 238.4 lb

## 2018-03-20 DIAGNOSIS — T50905D Adverse effect of unspecified drugs, medicaments and biological substances, subsequent encounter: Secondary | ICD-10-CM | POA: Diagnosis not present

## 2018-03-20 DIAGNOSIS — T781XXA Other adverse food reactions, not elsewhere classified, initial encounter: Secondary | ICD-10-CM | POA: Insufficient documentation

## 2018-03-20 DIAGNOSIS — J310 Chronic rhinitis: Secondary | ICD-10-CM | POA: Insufficient documentation

## 2018-03-20 DIAGNOSIS — R42 Dizziness and giddiness: Secondary | ICD-10-CM | POA: Diagnosis not present

## 2018-03-20 DIAGNOSIS — T781XXD Other adverse food reactions, not elsewhere classified, subsequent encounter: Secondary | ICD-10-CM

## 2018-03-20 DIAGNOSIS — T50905A Adverse effect of unspecified drugs, medicaments and biological substances, initial encounter: Secondary | ICD-10-CM | POA: Insufficient documentation

## 2018-03-20 MED ORDER — FLUTICASONE PROPIONATE 93 MCG/ACT NA EXHU: 2.0000 | INHALANT_SUSPENSION | Freq: Two times a day (BID) | NASAL | 12 refills | Status: DC

## 2018-03-20 NOTE — Assessment & Plan Note (Signed)
Wine and sulfa-containing foods cause irritation to the lips.  Continue avoidance.

## 2018-03-20 NOTE — Progress Notes (Signed)
New Patient Note  RE: Kimberly Mann MRN: [id number] DOB: 01-Feb-1956 Date of Office Visit: 03/20/2018  Referring provider: Shelda Pal* Primary care provider: Shelda Pal, DO  Chief Complaint: Allergic Rhinitis  and Dizziness  History of Present Illness: I had the pleasure of seeing Kimberly Mann for initial evaluation at the [facility] on 03/20/2018. Kimberly is a 62 y.o. Mann, who is referred here by Shelda Pal, DO for the evaluation of seasonal allergies.   Rhinitis:  Kimberly reports symptoms of vertigo, nasal congestion, sinus pressure, rhinorrhea, sneezing. Symptoms have been going on for 10-15 years. The symptoms are present all year around with worsening in spring and summer. Anosmia: no. Headache: yes. Additional trigger factors including exposure to strong odors(perfumes/cleaning agents). Kimberly has used xyzal, Flonase 2 sprays once a day, Nasonex with some improvement in symptoms. Sinus infections: no. Previous work up includes: skin testing in 2009 was negative to environmental allergies. Patient was never on allergy injections.  Previous ENT evaluation: no recent ENT evaluation but Kimberly was diagnosed with peripheral vertigo a few years ago. Kimberly did go to therapy for this with some benefit. Kimberly also had acupuncture for this with good benefit.  Previous sinus imaging: no.  Patient is in remission from breast cancer.  Assessment and Plan: Kimberly Mann is a 62 y.o. Mann with: Chronic rhinitis Perennial rhinitis symptoms for the last 10 to 15 years with worsening during the spring and summer.  Kimberly has used Xyzal, Flonase and Nasonex with some benefit. 2009 skin testing was negative to environmental allergies.  Patient was evaluated by ENT in the past.  Unable to skin test today due to recent antihistamine intake.  Get blood work as below and will make additional recommendations based on results.  Start xhance 2 sprays twice a  day  May use over the counter antihistamines such as Zyrtec (cetirizine), Claritin (loratadine), Allegra (fexofenadine), or Xyzal (levocetirizine) daily as needed.  Vertigo Vertigo seems to flare with worsening rhinitis symptoms. Kimberly has tried therapy and acupuncture in the past with good benefit.  Recommend vestibular therapy.   Kimberly may also have a component of eustachian tube dysfunction contributing to her symptoms.  Hoping that the new nasal spray will alleviate some of her symptoms.  Adverse food reaction Wine and sulfa-containing foods cause irritation to the lips.  Continue avoidance.  Drug reaction Develop developed facial swelling to penicillin more than 5 years ago.  Kimberly also had hives and pruritus with Cipro administration.  Continue to avoid penicillin and Cipro.  Recommend penicillin skin testing in the future and if favorable can do an office drug challenge.  Same plan for Cipro as well.  Return in about 2 months (around 05/20/2018).  Meds ordered this encounter  Medications  . Fluticasone Propionate (XHANCE) 93 MCG/ACT EXHU    Sig: Place 2 puffs into both nostrils 2 (two) times daily.    Dispense:  32 mL    Refill:  12    Lab Orders     Allergens Zone 2  Other allergy screening: Asthma: no Rhino conjunctivitis: yes Food allergy: yes  Wine and sulfa containing foods cause irritation to the skin. Medication allergy: yes  Penicillin - facial swelling, more than 5 years ago. Cipro - caused hives and pruritus. Hymenoptera allergy: no Urticaria: no  Eczema:yes History of recurrent infections suggestive of immunodeficency: no  Diagnostics: Skin Testing: Deferred due to recent antihistamines use.  Past Medical History: Patient Active Problem List  Diagnosis Date Noted  . Chronic rhinitis 03/20/2018  . Adverse food reaction 03/20/2018  . Drug reaction 03/20/2018  . Seasonal allergies 01/22/2018  . Breast cancer ([place]) 12/11/2017  . Vertigo 06/16/2017  .  Essential hypertension 06/16/2017  . Trochanteric bursitis, left hip 01/20/2017  . Trochanteric bursitis, right hip 09/18/2016  . Ear pain, bilateral 09/18/2016  . Back pain, lumbosacral 05/20/2016  . Bilateral shoulder pain 09/13/2015  . Trochanteric bursitis of left hip 05/17/2015  . Bilateral sciatica 05/17/2015  . Subacromial bursitis 12/13/2014  . Osteoarthritis of left shoulder 05/17/2014  . Neck pain 05/17/2014  . Lower back pain 05/17/2014  . Migraine 05/17/2014   Past Medical History:  Diagnosis Date  . Anxiety   . Cancer (HCC)    breast  . Chronic back pain    takes oxycodone  . Complication of anesthesia   . Eczema   . Environmental allergies   . Headache   . Hypertension   . PONV (postoperative nausea and vomiting)   . Vertigo    Past Surgical History: Past Surgical History:  Procedure Laterality Date  . BREAST REDUCTION SURGERY Left 11/20/2017   Procedure: left breast reduction for symmetry and liposuction bilateral lateral breasts;  Surgeon: Wallace Going, DO;  Location: [facility];  Service: Plastics;  Laterality: Left;  . BREAST SURGERY     rt breast lumpectomy  . LIPOSUCTION    . TUBAL LIGATION     Medication List:  Current Outpatient Medications  Medication Sig Dispense Refill  . amLODipine (NORVASC) 5 MG tablet Take 5 mg by mouth daily.      . diclofenac sodium (VOLTAREN) 1 % GEL Apply 2 g topically 4 (four) times daily. (Patient taking differently: Apply 2 g topically as needed. ) 300 g 5  . fluconazole (DIFLUCAN) 150 MG tablet Take 1 tablet (150 mg total) by mouth daily. 15 tablet 0  . hydrOXYzine (VISTARIL) 25 MG capsule Take 1 capsule (25 mg total) by mouth 3 (three) times daily as needed. 60 capsule 0  . levocetirizine (XYZAL) 5 MG tablet Take 5 mg by mouth every evening.    . Multiple Vitamins-Minerals (MULTIVITAMIN ADULT PO)     . promethazine (PHENERGAN) 25 MG tablet 25 mg as needed.     . tamoxifen (NOLVADEX) 20 MG  tablet Take 20 mg by mouth daily.    Marland Kitchen triamterene-hydrochlorothiazide (MAXZIDE-25) 37.5-25 MG tablet Take 0.5 tablets by mouth daily. 90 tablet 1  . Fluticasone Propionate (XHANCE) 93 MCG/ACT EXHU Place 2 puffs into both nostrils 2 (two) times daily. 32 mL 12  . meloxicam (MOBIC) 15 MG tablet Take 1 tablet (15 mg total) by mouth daily. (Patient not taking: Reported on 03/20/2018) 30 tablet 5  . oxyCODONE-acetaminophen (PERCOCET) 10-325 MG tablet Take 1 tablet by mouth every 6 (six) hours as needed for pain. (Patient not taking: Reported on 03/20/2018) 120 tablet 0  . triamcinolone cream (KENALOG) 0.5 % Apply to area on breasts twice daily for 10 days. (Patient not taking: Reported on 03/20/2018) 60 g 0   No current facility-administered medications for this visit.    Allergies: Allergies  Allergen Reactions  . Morphine And Related Nausea And Vomiting and Swelling  . Penicillins   . Sulfa Antibiotics    Social History: Social History   Socioeconomic History  . Marital status: Legally Separated    Spouse name: Not on file  . Number of children: Not on file  . Years of education: Not on file  .  Highest education level: Not on file  Occupational History  . Not on file  Social Needs  . Financial resource strain: Not on file  . Food insecurity:    Worry: Not on file    Inability: Not on file  . Transportation needs:    Medical: Not on file    Non-medical: Not on file  Tobacco Use  . Smoking status: Former Smoker    Last attempt to quit: 05/17/1974    Years since quitting: 43.8  . Smokeless tobacco: Never Used  Substance and Sexual Activity  . Alcohol use: Yes    Alcohol/week: 7.0 standard drinks    Types: 7 Standard drinks or equivalent per week    Comment: one glass of wine with dinner/fim  . Drug use: No    Types: Marijuana  . Sexual activity: Not Currently  Lifestyle  . Physical activity:    Days per week: Not on file    Minutes per session: Not on file  . Stress: Not  on file  Relationships  . Social connections:    Talks on phone: Not on file    Gets together: Not on file    Attends religious service: Not on file    Active member of club or organization: Not on file    Attends meetings of clubs or organizations: Not on file    Relationship status: Not on file  Other Topics Concern  . Not on file  Social History Narrative  . Not on file   Lives in a 63 year old home. Smoking: denies Occupation: not employed  Environmental HistoryFreight forwarder in the house: no Charity fundraiser in the family room: no Carpet in the bedroom: yes Heating: gas Cooling: central Pet: no  Family History: Family History  Problem Relation Age of Onset  . Hypertension Mother    Problem                               Relation Asthma                                   No  Eczema                                Grandson  Food allergy                          Grandson  Allergic rhino conjunctivitis     No   Review of Systems  Constitutional: Negative for appetite change, chills, fever and unexpected weight change.  HENT: Positive for congestion and sinus pressure. Negative for ear pain and rhinorrhea.   Eyes: Negative for itching.  Respiratory: Negative for cough, chest tightness, shortness of breath and wheezing.   Cardiovascular: Negative for chest pain.  Gastrointestinal: Negative for abdominal pain.  Genitourinary: Negative for difficulty urinating.  Skin: Negative for rash.  Allergic/Immunologic: Positive for environmental allergies. Negative for food allergies.  Neurological: Positive for dizziness. Negative for headaches.   Objective: BP (!) 142/104   Pulse 83   Temp 97.6 F (36.4 C) (Oral)   Resp 16   Ht 5' 4.84" (1.647 m)   Wt 238 lb 6.4 oz (108.1 kg)   SpO2 98%   BMI 39.86 kg/m  Body mass index is 39.86  kg/m. Physical Exam  Constitutional: Kimberly is oriented to person, place, and time. Kimberly appears well-developed and well-nourished.  HENT:  Head:  Normocephalic and atraumatic.  Right Ear: External ear normal.  Left Ear: External ear normal.  Nose: Mucosal edema (left turbinate hypertrophy) present.  Mouth/Throat: Oropharynx is clear and moist.  Eyes: Conjunctivae and EOM are normal.  Neck: Neck supple.  Cardiovascular: Normal rate, regular rhythm and normal heart sounds. Exam reveals no gallop and no friction rub.  No murmur heard. Pulmonary/Chest: Effort normal and breath sounds normal. Kimberly has no wheezes. Kimberly has no rales.  Abdominal: Soft. Bowel sounds are normal. There is no tenderness.  Lymphadenopathy:    Kimberly has no cervical adenopathy.  Neurological: Kimberly is alert and oriented to person, place, and time.  Skin: Skin is warm. No rash noted.  Psychiatric: Kimberly has a normal mood and affect. Her behavior is normal.  Nursing note and vitals reviewed.  The plan was reviewed with the patient/family, and all questions/concerned were addressed.  It was my pleasure to see Kimberly Mann today and participate in her care. Please feel free to contact me with any questions or concerns.  Sincerely,  Rexene Alberts, DO Allergy & Immunology  [facility] office: [phone] [place]

## 2018-03-20 NOTE — Assessment & Plan Note (Addendum)
Vertigo seems to flare with worsening rhinitis symptoms. She has tried therapy and acupuncture in the past with good benefit.  Recommend vestibular therapy.   She may also have a component of eustachian tube dysfunction contributing to her symptoms.  Hoping that the new nasal spray will alleviate some of her symptoms.

## 2018-03-20 NOTE — Patient Instructions (Addendum)
Get bloodwork  Start xhance 2 sprays twice a day  May use over the counter antihistamines such as Zyrtec (cetirizine), Claritin (loratadine), Allegra (fexofenadine), or Xyzal (levocetirizine) daily as needed.  Recommend therapy for vertigo  Follow up in 2 months

## 2018-03-20 NOTE — Assessment & Plan Note (Addendum)
Perennial rhinitis symptoms for the last 10 to 15 years with worsening during the spring and summer.  She has used Xyzal, Flonase and Nasonex with some benefit. 2009 skin testing was negative to environmental allergies.  Patient was evaluated by ENT in the past.  Unable to skin test today due to recent antihistamine intake.  Get blood work as below and will make additional recommendations based on results.  Start xhance 2 sprays twice a day  May use over the counter antihistamines such as Zyrtec (cetirizine), Claritin (loratadine), Allegra (fexofenadine), or Xyzal (levocetirizine) daily as needed.

## 2018-03-20 NOTE — Assessment & Plan Note (Signed)
Develop developed facial swelling to penicillin more than 5 years ago.  She also had hives and pruritus with Cipro administration.  Continue to avoid penicillin and Cipro.  Recommend penicillin skin testing in the future and if favorable can do an office drug challenge.  Same plan for Cipro as well.

## 2018-03-24 ENCOUNTER — Encounter: Payer: Self-pay | Admitting: Allergy

## 2018-03-24 LAB — IGE+ALLERGENS ZONE 2(30)
Aspergillus Fumigatus IgE: <0.1 kU/L
Bahia Grass IgE: <0.1 kU/L
Bermuda Grass IgE: <0.1 kU/L
Cedar, Mountain IgE: <0.1 kU/L
Cockroach, American IgE: <0.1 kU/L
D Farinae IgE: <0.1 kU/L
D Pteronyssinus IgE: <0.1 kU/L
Elm, American IgE: <0.1 kU/L
Hickory, White IgE: <0.1 kU/L
IgE (Immunoglobulin E), Serum: 89 IU/mL (ref 6–495)
Johnson Grass IgE: <0.1 kU/L
Maple/Box Elder IgE: <0.1 kU/L
Mugwort IgE Qn: <0.1 kU/L
Nettle IgE: <0.1 kU/L
Oak, White IgE: <0.1 kU/L
Penicillium Chrysogen IgE: <0.1 kU/L
Plantain, English IgE: <0.1 kU/L
Sheep Sorrel IgE Qn: <0.1 kU/L

## 2018-03-30 ENCOUNTER — Telehealth: Payer: Self-pay | Admitting: Neurology

## 2018-03-30 DIAGNOSIS — M545 Low back pain, unspecified: Secondary | ICD-10-CM

## 2018-03-30 MED ORDER — OXYCODONE-ACETAMINOPHEN 10-325 MG PO TABS: 1.0000 | ORAL_TABLET | Freq: Four times a day (QID) | ORAL | 0 refills | Status: DC | PRN

## 2018-03-30 NOTE — Telephone Encounter (Signed)
Checked drug registry. Pt last refilled 03/02/18 #[id number]. Post-dated to fill on or after 04/01/2018. Printed in Dr. Gladstone Lighter name since Dr. Felecia Shelling out of office.

## 2018-03-30 NOTE — Telephone Encounter (Signed)
Unable to fax controlled substances. Placed printed/signed rx up front for pt pick up.

## 2018-03-30 NOTE — Telephone Encounter (Signed)
Patient requesting refill of oxyCODONE-acetaminophen (PERCOCET) 10-325 MG tablet. I advised Dr. Felecia Shelling is out of the office and will send to his nurse.

## 2018-03-30 NOTE — Telephone Encounter (Signed)
Oxycodone refill Rx faxed to [facility], [employer]. Fax receipt confirmed.

## 2018-03-30 NOTE — Addendum Note (Signed)
Addended by: Rossie Muskrat L on: 03/30/2018 11:26 AM   Modules accepted: Orders

## 2018-03-30 NOTE — Telephone Encounter (Signed)
I called pt to let her know rx ready for pick up. Pt verbalized understanding.

## 2018-03-30 NOTE — Telephone Encounter (Signed)
Pharmacy called and stated that the patient faxed the script over to them. They would like for [place] to call and tell the patient they must bring in to the pharmacy.Marland KitchenMarland Kitchen

## 2018-04-01 ENCOUNTER — Encounter: Payer: Self-pay | Admitting: Obstetrics & Gynecology

## 2018-04-01 ENCOUNTER — Other Ambulatory Visit (HOSPITAL_COMMUNITY)
Admission: RE | Admit: 2018-04-01 | Discharge: 2018-04-01 | Disposition: A | Discharge status: Home / Self Care | Payer: Medicare HMO | Source: Ambulatory Visit | Attending: Obstetrics & Gynecology | Admitting: Obstetrics & Gynecology

## 2018-04-01 ENCOUNTER — Ambulatory Visit (INDEPENDENT_AMBULATORY_CARE_PROVIDER_SITE_OTHER): Payer: Medicare HMO | Admitting: Obstetrics & Gynecology

## 2018-04-01 VITALS — BP 168/83 | HR 98 | Ht 65.0 in

## 2018-04-01 DIAGNOSIS — Z113 Encounter for screening for infections with a predominantly sexual mode of transmission: Secondary | ICD-10-CM | POA: Insufficient documentation

## 2018-04-01 NOTE — Progress Notes (Signed)
History:  62 y.o. G1P1000 here today for STI screening. She was recently sexually active with her ex husband and the condom broke. She is worried about STIs.    The following portions of the patient's history were reviewed and updated as appropriate: allergies, current medications, past family history, past medical history, past social history, past surgical history and problem list.  Review of Systems:  Pertinent items are noted in HPI.    Objective:  Physical Exam Blood pressure (!) 168/83, pulse 98, height 5\' 5"  (1.651 m).  CONSTITUTIONAL: Well-developed, well-nourished female in no acute distress.  HENT:  Normocephalic, atraumatic EYES: Conjunctivae and EOM are normal. No scleral icterus.  NECK: Normal range of motion SKIN: Skin is warm and dry. No rash noted. Not diaphoretic.No pallor. [place]: Alert and oriented to person, place, and time. Normal coordination. '  Abd: Soft, nontender and nondistended Pelvic: Normal appearing external genitalia; normal appearing vaginal mucosa and cervix.  Normal discharge.  Small uterus, no other palpable masses, no uterine or adnexal tenderness    Assessment & Plan:  R/o STI  cervical cx obtained.   Labs: RPR, HIV and Hep B&C  Yuta Cipollone L. Harraway-Smith, M.D., Cherlynn June

## 2018-04-01 NOTE — Progress Notes (Signed)
Patient desire STD testing. Patient had recent new partner. Kathrene Alu RN

## 2018-04-02 LAB — CERVICOVAGINAL ANCILLARY ONLY
Bacterial vaginitis: NEGATIVE
CHLAMYDIA, DNA PROBE: NEGATIVE
Candida vaginitis: NEGATIVE
Neisseria Gonorrhea: NEGATIVE
TRICH (WINDOWPATH): NEGATIVE

## 2018-04-02 LAB — HIV ANTIBODY (ROUTINE TESTING W REFLEX): HIV SCREEN 4TH GENERATION: NONREACTIVE

## 2018-04-02 LAB — RPR: RPR: NONREACTIVE

## 2018-04-02 LAB — HEPATITIS C ANTIBODY: Hep C Virus Ab: <0.1 s/co ratio (ref 0.0–0.9)

## 2018-04-03 ENCOUNTER — Encounter: Payer: Self-pay | Admitting: Obstetrics & Gynecology

## 2018-04-16 ENCOUNTER — Encounter

## 2018-04-16 ENCOUNTER — Encounter: Payer: Self-pay | Admitting: Neurology

## 2018-04-16 ENCOUNTER — Ambulatory Visit (INDEPENDENT_AMBULATORY_CARE_PROVIDER_SITE_OTHER): Payer: Medicare HMO | Admitting: Neurology

## 2018-04-16 VITALS — BP 143/94 | HR 92 | Ht 65.0 in | Wt 242.0 lb

## 2018-04-16 DIAGNOSIS — M7062 Trochanteric bursitis, left hip: Secondary | ICD-10-CM

## 2018-04-16 DIAGNOSIS — C50919 Malignant neoplasm of unspecified site of unspecified female breast: Secondary | ICD-10-CM

## 2018-04-16 DIAGNOSIS — M25511 Pain in right shoulder: Secondary | ICD-10-CM | POA: Diagnosis not present

## 2018-04-16 DIAGNOSIS — M7061 Trochanteric bursitis, right hip: Secondary | ICD-10-CM

## 2018-04-16 DIAGNOSIS — M542 Cervicalgia: Secondary | ICD-10-CM | POA: Diagnosis not present

## 2018-04-16 DIAGNOSIS — M545 Low back pain, unspecified: Secondary | ICD-10-CM

## 2018-04-16 DIAGNOSIS — M25512 Pain in left shoulder: Secondary | ICD-10-CM

## 2018-04-16 DIAGNOSIS — G8929 Other chronic pain: Secondary | ICD-10-CM | POA: Diagnosis not present

## 2018-04-16 MED ORDER — DICLOFENAC SODIUM 1 % TD GEL: 2.0000 g | Freq: Four times a day (QID) | TRANSDERMAL | 5 refills | Status: DC | PRN

## 2018-04-16 MED ORDER — HYDROXYZINE PAMOATE 25 MG PO CAPS: 25.0000 mg | ORAL_CAPSULE | Freq: Three times a day (TID) | ORAL | 1 refills | Status: DC | PRN

## 2018-04-16 MED ORDER — OXYCODONE-ACETAMINOPHEN 10-325 MG PO TABS: 1.0000 | ORAL_TABLET | Freq: Four times a day (QID) | ORAL | 0 refills | Status: DC | PRN

## 2018-04-16 NOTE — Progress Notes (Signed)
t  [facility]  PATIENT: Kimberly Mann DOB: October 22, 1955  REFERRING CLINICIAN: [name]  HISTORY FROM: Patient  REASON FOR VISIT: Left shoulder and neck pain; back pain [  HISTORICAL  CHIEF COMPLAINT:  Chief Complaint  Patient presents with  . Follow-up    RM 12, alone. Last seen 12/11/17. Wants inj. today.     HISTORY OF PRESENT ILLNESS:  Kimberly Mann is a 62 y.o. woman with neck, back, hip and shoulder pain.  Update 04/16/2018: She is reporting pain in the hips, over the trochanteric bursa.   Pain increases if she walks more.   In the past trochanteric bursae injections have helped a while.   She also notes new pain in her feet, especially the 4th and 5th digits.     She also has some pain in the lower back but this is not as bad as the hip pain.  She also is reporting pain in her right shoulder.   Pain increases with rotation some.   She also has neck pain and known C3-C6 DJD   She has breast cancer,  She is done with RadRx and still is on tamoxifen.    Her chest pain is better.     Update 12/11/2017: She had recent reconstructive surgery after breast cancer and is more sore in general (Dr. Doreen Salvage;  Also sees Dr. [facility]).   She is on tamoxifen and did RadRx.      She has pain everywhere.   She has pain in both hips and that is her worse pain.   Trochanteric bursa injections have helped in the past.   Due to her recent surgery, she would prefer to hold off on any injections today.     She also notes neck pain, worse with movements.   She has multilevel DJD C3-C6.       Update 08/05/2017: She is reporting more hip pain, bilaterally.    Pain increases if she lays on that side.  Trochanteric injections ususally help for a few months.    She also has back pain.  She has milder neck pain, worse with tirning he head.   Shoulders are only mildly tender  Update 06/04/2017: Neck, back and shoulder pain improved after the trigger point injections in  October but have returned over the last month.      MRI cervical spine showed DJD at C3C4-C5C6.    Her worse pain is in the left shoulder.   Pain increases if she lays on that side.    She also has right shoulder region pain.   She also has neck pain that increases as the day goes on.   Her LB is hurting but not as bad as the neck and shoulders.   Hipas are hurting a lot over the bursae.    Bursa injections have helped in the past.    She sees Dr. Harlow Asa for breast cancer.   She had surgery and radiation and will be starting a pill x 5 days  Update 01/30/2017:    Since the last visit, her neck pain has greatly increased. She is undergoing radiation for breast cancer and she notes that laying down on the table is painful for her.   The pain is in the left neck more than the right. Additionally she is noticing more lower back pain also little worse on the left than the right. She denies any change in strength or sensation in the arms or legs. There is no change  in her bladder.    Her hip and shoulder pain improved quite a bit after the joint injections at her last visit. ________________________________ From 01/30/2017 LBP/hip pain:   She reports bilateral lower back pain and pain that is in the buttock and hip region, generally worse on the right.   Pain does not radiate into her legs. Pain is worse with walking and with standing up from a seated position.  In the past, trochanteric bursa injection has helped.   Neck/Shoulder pain:   She reports pain in the neck and shoulders, worse on her right in the neck but in the left > right shulder.    Pain is worse with arms elevated,    Her pain also increases with external rotation and elevation.  Subacromial bursa injection often helped in the past.   However, she has had some itching following the steroid shots and it was especially bad after the last one 3-4 months ago.    ROM is mildly reduced in shoulders.    No arm weakness.   Breast cancer:   She needed  surgery x 2 and is currently on RadRx for breast cancer.       Med's:  For her pain she is on Percocet 4 times a day with benefit.  Sheis also on meloxicam and tizanidine.   She has not shown any drug-seeking behavior       Migraine headaches occur about once a week. If Percocet does not help the migraine she will take a meloxicam.    REVIEW OF SYSTEMS:  Constitutional: No fevers, chills, sweats, or change in appetite Eyes: No visual changes, double vision, eye pain Ear, nose and throat: No hearing loss, ear pain, nasal congestion, sore throat Cardiovascular: No chest pain, palpitations Respiratory:  No shortness of breath at rest or with exertion.   No wheezes GastrointestinaI: No nausea, vomiting, diarrhea, abdominal pain, fecal incontinence Genitourinary:  No dysuria, urinary retention or frequency.  No nocturia. Musculoskeletal: as above Integumentary: No rash, pruritus, skin lesions Neurological: as above Psychiatric: She has som depression since breast cancer dx.  Endocrine: No palpitations, diaphoresis, change in appetite, change in weigh or increased thirst  ALLERGIES: Allergies  Allergen Reactions  . Morphine And Related Nausea And Vomiting and Swelling  . Penicillins   . Sulfa Antibiotics     HOME MEDICATIONS: Outpatient Medications Prior to Visit  Medication Sig Dispense Refill  . amLODipine (NORVASC) 5 MG tablet Take 5 mg by mouth daily.      . fluconazole (DIFLUCAN) 150 MG tablet Take 1 tablet (150 mg total) by mouth daily. 15 tablet 0  . Fluticasone Propionate (XHANCE) 93 MCG/ACT EXHU Place 2 puffs into both nostrils 2 (two) times daily. 32 mL 12  . levocetirizine (XYZAL) 5 MG tablet Take 5 mg by mouth every evening.    . meloxicam (MOBIC) 15 MG tablet Take 1 tablet (15 mg total) by mouth daily. 30 tablet 5  . Multiple Vitamins-Minerals (MULTIVITAMIN ADULT PO)     . promethazine (PHENERGAN) 25 MG tablet 25 mg as needed.     . tamoxifen (NOLVADEX) 20 MG tablet Take  20 mg by mouth daily.    Marland Kitchen triamcinolone cream (KENALOG) 0.5 % Apply to area on breasts twice daily for 10 days. 60 g 0  . triamterene-hydrochlorothiazide (MAXZIDE-25) 37.5-25 MG tablet Take 0.5 tablets by mouth daily. 90 tablet 1  . diclofenac sodium (VOLTAREN) 1 % GEL Apply 2 g topically 4 (four) times daily. (Patient taking differently:  Apply 2 g topically as needed. ) 300 g 5  . hydrOXYzine (VISTARIL) 25 MG capsule Take 1 capsule (25 mg total) by mouth 3 (three) times daily as needed. 60 capsule 0  . oxyCODONE-acetaminophen (PERCOCET) 10-325 MG tablet Take 1 tablet by mouth every 6 (six) hours as needed for pain. 120 tablet 0   No facility-administered medications prior to visit.     PAST MEDICAL HISTORY: Past Medical History:  Diagnosis Date  . Anxiety   . Cancer (HCC)    breast  . Chronic back pain    takes oxycodone  . Complication of anesthesia   . Eczema   . Environmental allergies   . Headache   . Hypertension   . PONV (postoperative nausea and vomiting)   . Vertigo     PAST SURGICAL HISTORY: Past Surgical History:  Procedure Laterality Date  . BREAST REDUCTION SURGERY Left 11/20/2017   Procedure: left breast reduction for symmetry and liposuction bilateral lateral breasts;  Surgeon: Wallace Going, DO;  Location: [facility];  Service: Plastics;  Laterality: Left;  . BREAST SURGERY     rt breast lumpectomy  . LIPOSUCTION    . TUBAL LIGATION      FAMILY HISTORY: Family History  Problem Relation Age of Onset  . Hypertension Mother     SOCIAL HISTORY:  Social History   Socioeconomic History  . Marital status: Legally Separated    Spouse name: Not on file  . Number of children: Not on file  . Years of education: Not on file  . Highest education level: Not on file  Occupational History  . Not on file  Social Needs  . Financial resource strain: Not on file  . Food insecurity:    Worry: Not on file    Inability: Not on file  .  Transportation needs:    Medical: Not on file    Non-medical: Not on file  Tobacco Use  . Smoking status: Former Smoker    Last attempt to quit: 05/17/1974    Years since quitting: 43.9  . Smokeless tobacco: Never Used  Substance and Sexual Activity  . Alcohol use: Yes    Alcohol/week: 7.0 standard drinks    Types: 7 Standard drinks or equivalent per week    Comment: one glass of wine with dinner/fim  . Drug use: No    Types: Marijuana  . Sexual activity: Not Currently  Lifestyle  . Physical activity:    Days per week: Not on file    Minutes per session: Not on file  . Stress: Not on file  Relationships  . Social connections:    Talks on phone: Not on file    Gets together: Not on file    Attends religious service: Not on file    Active member of club or organization: Not on file    Attends meetings of clubs or organizations: Not on file    Relationship status: Not on file  . Intimate partner violence:    Fear of current or ex partner: Not on file    Emotionally abused: Not on file    Physically abused: Not on file    Forced sexual activity: Not on file  Other Topics Concern  . Not on file  Social History Narrative  . Not on file     PHYSICAL EXAM  Vitals:   04/16/18 1608  BP: (!) 143/94  Pulse: 92  Weight: 242 lb (109.8 kg)  Height: 5\' 5"  (1.651  m)    Body mass index is 40.27 kg/m.   General: The patient is well-developed and well-nourished and in no acute distress   Musculoskeletal:    She is tender over the right greater than left subacromial bursa.  There also is some tenderness over the trapezius and paraspinal muscles on the right more than left.  She is tender over the trochanteric bursae bilaterally with milder tenderness over the piriformis muscles bilaterally.   Neurologic Exam  Mental status: The patient is alert and oriented x 3 at the time of the examination. The patient has apparent normal recent and remote memory, with an apparently normal  attention span and concentration ability.    Cranial nerves:    Facial strength is normal.  Trapezius strength is normal.  No dysarthria is noted.    No obvious hearing deficits are noted.  Motor:  Muscle bulk and tone are normal. Strength is 5/5 in the arms or legs.   Gait and station: Station is normal.  The gait is arthritic.  Tandem gait is mildly wide.  DTRs:   Reflexes are normal and symmetric in the arms and legs.     DIAGNOSTIC DATA (LABS, IMAGING, TESTING) - I reviewed patient records, labs, notes, testing and imaging myself where available.     ASSESSMENT AND PLAN   Trochanteric bursitis of left hip  Back pain, lumbosacral - Plan: oxyCODONE-acetaminophen (PERCOCET) 10-325 MG tablet  Trochanteric bursitis, right hip  Neck pain  Chronic pain of both shoulders  Malignant neoplasm of female breast, unspecified estrogen receptor status, unspecified laterality, unspecified site of breast ([place])   1.   Inject bilateral trochanteric bursae (hips) with 60 mg Depo-Medrol in [place] using sterile technique.  She tolerated the procedure well and there were no complications. 2.   Inject the right subacromial bursa (shoulder) with 20 mg Depo-Medrol and Marcaine using sterile technique.  She tolerated the procedure well there were no complications. 3.   Renew Percocet.  The [place] controlled substance database was reviewed.  Continue other medications. 3.   Try to stay active and exercises tolerated. 4.    Renew diclofenac gel.   5.    She will return to see me in 4 months, sooner if she  has new or worsening neurologic symptoms.    Florella Mcneese A. Felecia Shelling, MD, PhD 81/01/3158, 4:58 PM Certified in Neurology, Clinical Neurophysiology, Sleep Medicine, Pain Medicine and Neuroimaging  [facility] [address], [place], [place] [address] [phone]

## 2018-04-20 ENCOUNTER — Other Ambulatory Visit: Payer: Self-pay | Admitting: Neurology

## 2018-05-27 ENCOUNTER — Other Ambulatory Visit: Payer: Self-pay | Admitting: Neurology

## 2018-05-27 DIAGNOSIS — M545 Low back pain, unspecified: Secondary | ICD-10-CM

## 2018-05-27 MED ORDER — OXYCODONE-ACETAMINOPHEN 10-325 MG PO TABS: 1.0000 | ORAL_TABLET | Freq: Four times a day (QID) | ORAL | 0 refills | Status: DC | PRN

## 2018-05-27 NOTE — Telephone Encounter (Signed)
Pt is asking for a refill on oxyCODONE-acetaminophen (PERCOCET) 10-325 MG tablet please send to  [facility] *no changes to insurance*

## 2018-05-27 NOTE — Addendum Note (Signed)
Addended by: Hope Pigeon on: 05/27/2018 03:08 PM   Modules accepted: Orders

## 2018-06-03 DIAGNOSIS — H5213 Myopia, bilateral: Secondary | ICD-10-CM | POA: Diagnosis not present

## 2018-06-03 DIAGNOSIS — H524 Presbyopia: Secondary | ICD-10-CM | POA: Diagnosis not present

## 2018-06-03 DIAGNOSIS — H5203 Hypermetropia, bilateral: Secondary | ICD-10-CM | POA: Diagnosis not present

## 2018-06-03 DIAGNOSIS — H43393 Other vitreous opacities, bilateral: Secondary | ICD-10-CM | POA: Diagnosis not present

## 2018-06-03 DIAGNOSIS — H52203 Unspecified astigmatism, bilateral: Secondary | ICD-10-CM | POA: Diagnosis not present

## 2018-06-03 DIAGNOSIS — H25013 Cortical age-related cataract, bilateral: Secondary | ICD-10-CM | POA: Diagnosis not present

## 2018-06-03 DIAGNOSIS — H2513 Age-related nuclear cataract, bilateral: Secondary | ICD-10-CM | POA: Diagnosis not present

## 2018-06-03 DIAGNOSIS — Z0100 Encounter for examination of eyes and vision without abnormal findings: Secondary | ICD-10-CM | POA: Diagnosis not present

## 2018-06-21 ENCOUNTER — Encounter (HOSPITAL_BASED_OUTPATIENT_CLINIC_OR_DEPARTMENT_OTHER): Payer: Self-pay | Admitting: Emergency Medicine

## 2018-06-21 ENCOUNTER — Other Ambulatory Visit: Payer: Self-pay

## 2018-06-21 ENCOUNTER — Emergency Department (HOSPITAL_BASED_OUTPATIENT_CLINIC_OR_DEPARTMENT_OTHER): Payer: Medicare HMO

## 2018-06-21 ENCOUNTER — Emergency Department (HOSPITAL_BASED_OUTPATIENT_CLINIC_OR_DEPARTMENT_OTHER)
Admission: EM | Admit: 2018-06-21 | Discharge: 2018-06-21 | Disposition: A | Discharge status: Home / Self Care | Payer: Medicare HMO | Attending: Emergency Medicine | Admitting: Emergency Medicine

## 2018-06-21 DIAGNOSIS — R600 Localized edema: Secondary | ICD-10-CM | POA: Diagnosis not present

## 2018-06-21 DIAGNOSIS — Z79899 Other long term (current) drug therapy: Secondary | ICD-10-CM | POA: Insufficient documentation

## 2018-06-21 DIAGNOSIS — R2232 Localized swelling, mass and lump, left upper limb: Secondary | ICD-10-CM | POA: Insufficient documentation

## 2018-06-21 DIAGNOSIS — M25522 Pain in left elbow: Secondary | ICD-10-CM

## 2018-06-21 DIAGNOSIS — I10 Essential (primary) hypertension: Secondary | ICD-10-CM | POA: Insufficient documentation

## 2018-06-21 DIAGNOSIS — Z853 Personal history of malignant neoplasm of breast: Secondary | ICD-10-CM | POA: Diagnosis not present

## 2018-06-21 DIAGNOSIS — Z87891 Personal history of nicotine dependence: Secondary | ICD-10-CM | POA: Insufficient documentation

## 2018-06-21 NOTE — ED Triage Notes (Signed)
L arm pain and swelling x 1week

## 2018-06-21 NOTE — ED Provider Notes (Signed)
[facility] EMERGENCY DEPARTMENT Provider Note   CSN: [id number] Arrival date & time: 06/21/18  1246    History   Chief Complaint Chief Complaint  Patient presents with  . Arm Pain    HPI Kimberly Mann is a 63 y.o. female.     HPI Patient presents with left elbow pain and swelling.  Has had for the last week.  States she has been using some weights but only 5 pounds.  No trauma.  States it hurts to press the swollen area on her arm.  Does have previous history of breast cancer.  No chest pain or trouble breathing.  No swelling of the arm besides an area just proximal to her elbow.  Does not hurt to move the elbow but does hurt to press on that area. Past Medical History:  Diagnosis Date  . Anxiety   . Cancer (HCC)    breast  . Chronic back pain    takes oxycodone  . Complication of anesthesia   . Eczema   . Environmental allergies   . Headache   . Hypertension   . PONV (postoperative nausea and vomiting)   . Vertigo     Patient Active Problem List   Diagnosis Date Noted  . Chronic rhinitis 03/20/2018  . Adverse food reaction 03/20/2018  . Drug reaction 03/20/2018  . Seasonal allergies 01/22/2018  . Breast cancer ([place]) 12/11/2017  . Vertigo 06/16/2017  . Essential hypertension 06/16/2017  . Trochanteric bursitis, left hip 01/20/2017  . Trochanteric bursitis, right hip 09/18/2016  . Ear pain, bilateral 09/18/2016  . Back pain, lumbosacral 05/20/2016  . Bilateral shoulder pain 09/13/2015  . Trochanteric bursitis of left hip 05/17/2015  . Bilateral sciatica 05/17/2015  . Subacromial bursitis 12/13/2014  . Osteoarthritis of left shoulder 05/17/2014  . Neck pain 05/17/2014  . Lower back pain 05/17/2014  . Migraine 05/17/2014    Past Surgical History:  Procedure Laterality Date  . BREAST REDUCTION SURGERY Left 11/20/2017   Procedure: left breast reduction for symmetry and liposuction bilateral lateral breasts;  Surgeon: Wallace Going, DO;   Location: [facility];  Service: Plastics;  Laterality: Left;  . BREAST SURGERY     rt breast lumpectomy  . LIPOSUCTION    . TUBAL LIGATION       OB History    Gravida  1   Para  1   Term  1   Preterm      AB      Living        SAB      TAB      Ectopic      Multiple      Live Births  1            Home Medications    Prior to Admission medications   Medication Sig Start Date End Date Taking? Authorizing Provider  amLODipine (NORVASC) 5 MG tablet Take 5 mg by mouth daily.      [provider]  diclofenac sodium (VOLTAREN) 1 % GEL Apply 2 g topically 4 (four) times daily as needed. 04/16/18   Sater, Nanine Means, MD  fluconazole (DIFLUCAN) 150 MG tablet TAKE 1 TABLET BY MOUTH ONCE DAILY 04/23/18   Sater, Nanine Means, MD  Fluticasone Propionate (XHANCE) 93 MCG/ACT EXHU Place 2 puffs into both nostrils 2 (two) times daily. 03/20/18   Garnet Sierras, DO  hydrOXYzine (VISTARIL) 25 MG capsule Take 1 capsule (25 mg total) by mouth 3 (three)  times daily as needed. 04/16/18   Sater, Nanine Means, MD  levocetirizine (XYZAL) 5 MG tablet Take 5 mg by mouth every evening.    [provider]  meloxicam (MOBIC) 15 MG tablet Take 1 tablet (15 mg total) by mouth daily. 12/11/17   Sater, Nanine Means, MD  Multiple Vitamins-Minerals (MULTIVITAMIN ADULT PO)  03/29/15   [provider]  oxyCODONE-acetaminophen (PERCOCET) 10-325 MG tablet Take 1 tablet by mouth every 6 (six) hours as needed for pain. 05/27/18   Sater, Nanine Means, MD  promethazine (PHENERGAN) 25 MG tablet 25 mg as needed.  03/05/15   [provider]  tamoxifen (NOLVADEX) 20 MG tablet Take 20 mg by mouth daily.    [provider]  triamcinolone cream (KENALOG) 0.5 % Apply to area on breasts twice daily for 10 days. 01/23/18   Shelda Pal, DO  triamterene-hydrochlorothiazide (MAXZIDE-25) 37.5-25 MG tablet Take 0.5 tablets by mouth daily. 01/22/18   Shelda Pal,  DO    Family History Family History  Problem Relation Age of Onset  . Hypertension Mother     Social History Social History   Tobacco Use  . Smoking status: Former Smoker    Last attempt to quit: 05/17/1974    Years since quitting: 44.1  . Smokeless tobacco: Never Used  Substance Use Topics  . Alcohol use: Yes    Alcohol/week: 7.0 standard drinks    Types: 7 Standard drinks or equivalent per week    Comment: one glass of wine with dinner/fim  . Drug use: No    Types: Marijuana     Allergies   Morphine and related; Penicillins; and Sulfa antibiotics   Review of Systems Review of Systems  Constitutional: Negative for appetite change.  HENT: Negative for congestion.   Respiratory: Negative for shortness of breath.   Cardiovascular: Negative for chest pain.  Genitourinary: Negative for flank pain.  Musculoskeletal:       Left elbow pain and swelling.  Skin: Negative for rash.  Neurological: Negative for weakness.  Psychiatric/Behavioral: Negative for confusion.     Physical Exam Updated Vital Signs BP (!) 157/101 (BP Location: Right Arm)   Pulse 78   Temp 97.7 F (36.5 C) (Oral)   Resp 16   Ht 5\' 6"  (1.676 m)   Wt 106.1 kg   SpO2 100%   BMI 37.77 kg/m   Physical Exam Vitals signs and nursing note reviewed.  HENT:     Head: Atraumatic.  Neck:     Musculoskeletal: Neck supple.  Abdominal:     Tenderness: There is no abdominal tenderness.  Musculoskeletal:        General: Tenderness present.     Comments: There is a swollen tender area on the medial proximal left elbow with some area into the antecubital area.  No fluctuance.  Painless range of motion of the elbow.  Neurovascular intact in hand.  No tenderness over shoulder.  No axillary lymph nodes.  Swelling does not increase with either flexion or extension at the elbow.  Skin:    Capillary Refill: Capillary refill takes less than 2 seconds.  Neurological:     General: No focal deficit present.      Mental Status: She is alert.      ED Treatments / Results  Labs (all labs ordered are listed, but only abnormal results are displayed) Labs Reviewed - No data to display  EKG None  Radiology US Venous Img Upper Uni Left  Result Date: 06/21/2018  CLINICAL DATA:  Pain and edema of the left upper extremity. EXAM: LEFT UPPER EXTREMITY VENOUS DOPPLER ULTRASOUND TECHNIQUE: Gray-scale sonography with graded compression, as well as color Doppler and duplex ultrasound were performed to evaluate the upper extremity deep venous system from the level of the subclavian vein and including the jugular, axillary, basilic, radial, ulnar and upper cephalic vein. Spectral Doppler was utilized to evaluate flow at rest and with distal augmentation maneuvers. COMPARISON:  None. FINDINGS: Contralateral Subclavian Vein: Respiratory phasicity is normal and symmetric with the symptomatic side. No evidence of thrombus. Normal compressibility. Internal Jugular Vein: No evidence of thrombus. Normal compressibility, respiratory phasicity and response to augmentation. Subclavian Vein: No evidence of thrombus. Normal compressibility, respiratory phasicity and response to augmentation. Axillary Vein: No evidence of thrombus. Normal compressibility, respiratory phasicity and response to augmentation. Cephalic Vein: No evidence of thrombus. Normal compressibility, respiratory phasicity and response to augmentation. Basilic Vein: No evidence of thrombus. Normal compressibility, respiratory phasicity and response to augmentation. Brachial Veins: No evidence of thrombus. Normal compressibility, respiratory phasicity and response to augmentation. Radial Veins: No evidence of thrombus. Normal compressibility, respiratory phasicity and response to augmentation. Ulnar Veins: No evidence of thrombus. Normal compressibility, respiratory phasicity and response to augmentation. Venous Reflux:  None visualized. Other Findings:  None visualized.  IMPRESSION: No evidence of DVT within the left upper extremity. Electronically Signed   By: Fidela Salisbury M.D.   On: 06/21/2018 14:58    Procedures Procedures (including critical care time)  Medications Ordered in ED Medications - No data to display   Initial Impression / Assessment and Plan / ED Course  I have reviewed the triage vital signs and the nursing notes.  Pertinent labs & imaging results that were available during my care of the patient were reviewed by me and considered in my medical decision making (see chart for details).        Patient with elbow pain.  I think likely musculoskeletal.  There is a swelling medially and slightly antecubital area.  Does not appear to be infection.  Negative Doppler.  Does not appear to be bony tenderness.  Discharged home with sports medicine follow-up.  Final Clinical Impressions(s) / ED Diagnoses   Final diagnoses:  Left elbow pain    ED Discharge Orders    None       Davonna Belling, MD 06/21/18 [phone]

## 2018-06-24 ENCOUNTER — Other Ambulatory Visit: Payer: Self-pay | Admitting: Family Medicine

## 2018-06-24 ENCOUNTER — Other Ambulatory Visit: Payer: Self-pay | Admitting: Neurology

## 2018-06-24 DIAGNOSIS — M545 Low back pain, unspecified: Secondary | ICD-10-CM

## 2018-06-24 DIAGNOSIS — I10 Essential (primary) hypertension: Secondary | ICD-10-CM

## 2018-06-24 MED ORDER — OXYCODONE-ACETAMINOPHEN 10-325 MG PO TABS: 1.0000 | ORAL_TABLET | Freq: Four times a day (QID) | ORAL | 0 refills | Status: DC | PRN

## 2018-06-24 MED ORDER — AMLODIPINE BESYLATE 5 MG PO TABS: 5.0000 mg | ORAL_TABLET | Freq: Every day | ORAL | 0 refills | Status: DC

## 2018-06-24 MED ORDER — TRIAMTERENE-HCTZ 37.5-25 MG PO TABS: 0.5000 | ORAL_TABLET | Freq: Every day | ORAL | 1 refills | Status: DC

## 2018-06-24 NOTE — Telephone Encounter (Signed)
Pt is needing a refill on her oxyCODONE-acetaminophen (PERCOCET) 10-325 MG tablet sent to [facility] in [facility]

## 2018-06-26 DIAGNOSIS — L821 Other seborrheic keratosis: Secondary | ICD-10-CM | POA: Diagnosis not present

## 2018-06-30 DIAGNOSIS — Z79810 Long term (current) use of selective estrogen receptor modulators (SERMs): Secondary | ICD-10-CM | POA: Diagnosis not present

## 2018-06-30 DIAGNOSIS — Z170 Estrogen receptor positive status [ER+]: Secondary | ICD-10-CM | POA: Diagnosis not present

## 2018-06-30 DIAGNOSIS — Z853 Personal history of malignant neoplasm of breast: Secondary | ICD-10-CM | POA: Diagnosis not present

## 2018-06-30 DIAGNOSIS — Z08 Encounter for follow-up examination after completed treatment for malignant neoplasm: Secondary | ICD-10-CM | POA: Diagnosis not present

## 2018-06-30 DIAGNOSIS — C50511 Malignant neoplasm of lower-outer quadrant of right female breast: Secondary | ICD-10-CM | POA: Diagnosis not present

## 2018-06-30 DIAGNOSIS — Z5181 Encounter for therapeutic drug level monitoring: Secondary | ICD-10-CM | POA: Diagnosis not present

## 2018-07-06 ENCOUNTER — Encounter: Payer: Self-pay | Admitting: Neurology

## 2018-07-23 ENCOUNTER — Encounter: Payer: Medicare HMO | Admitting: Family Medicine

## 2018-07-28 ENCOUNTER — Telehealth: Payer: Self-pay | Admitting: *Deleted

## 2018-07-28 NOTE — Telephone Encounter (Signed)
Called pt to discuss appt 07/29/18 at 11am tomorrow with Dr. Felecia Shelling. Advised that due to current COVID-19 pandemic, our office is severely reducing in office visits for at least the next 2 weeks, in order to minimize the risk to our patients and healthcare providers.  She has consented to a video visit and to be filed via insurance. Explained how to download [employer] wedex app. She is aware she will get an email.  Email: [email or web address]

## 2018-07-29 ENCOUNTER — Encounter: Payer: Self-pay | Admitting: Neurology

## 2018-07-29 ENCOUNTER — Ambulatory Visit (INDEPENDENT_AMBULATORY_CARE_PROVIDER_SITE_OTHER): Payer: Medicare HMO | Admitting: Neurology

## 2018-07-29 ENCOUNTER — Other Ambulatory Visit: Payer: Self-pay

## 2018-07-29 DIAGNOSIS — G43009 Migraine without aura, not intractable, without status migrainosus: Secondary | ICD-10-CM | POA: Diagnosis not present

## 2018-07-29 DIAGNOSIS — M542 Cervicalgia: Secondary | ICD-10-CM | POA: Diagnosis not present

## 2018-07-29 DIAGNOSIS — M7062 Trochanteric bursitis, left hip: Secondary | ICD-10-CM

## 2018-07-29 DIAGNOSIS — M7550 Bursitis of unspecified shoulder: Secondary | ICD-10-CM

## 2018-07-29 DIAGNOSIS — M7061 Trochanteric bursitis, right hip: Secondary | ICD-10-CM

## 2018-07-29 DIAGNOSIS — G8929 Other chronic pain: Secondary | ICD-10-CM | POA: Diagnosis not present

## 2018-07-29 DIAGNOSIS — M545 Low back pain, unspecified: Secondary | ICD-10-CM

## 2018-07-29 MED ORDER — PREDNISONE 10 MG (21) PO TBPK: ORAL_TABLET | ORAL | 0 refills | Status: DC

## 2018-07-29 MED ORDER — OXYCODONE-ACETAMINOPHEN 10-325 MG PO TABS: 1.0000 | ORAL_TABLET | Freq: Four times a day (QID) | ORAL | 0 refills | Status: DC | PRN

## 2018-07-29 MED ORDER — MELOXICAM 15 MG PO TABS: 15.0000 mg | ORAL_TABLET | ORAL | 1 refills | Status: DC | PRN

## 2018-07-29 NOTE — Progress Notes (Signed)
t  [facility]  PATIENT: Kimberly Mann DOB: 05-03-1955  REFERRING CLINICIAN: [name]  HISTORY FROM: Patient  REASON FOR VISIT: Left shoulder and neck pain; back pain [  HISTORICAL  CHIEF COMPLAINT:  No chief complaint on file.   HISTORY OF PRESENT ILLNESS:  Kimberly Mann is a 63 y.o. woman with neck, back, hip and shoulder pain.  Update 07/29/2018: Virtual Visit via Video Note  I connected with Kimberly Mann on 07/29/18 at 11:00 AM EDT by a video enabled telemedicine application and verified that I am speaking with the correct person using two identifiers.   I discussed the limitations of evaluation and management by telemedicine and the availability of in person appointments. The patient expressed understanding and agreed to proceed.  History of Present Illness: She reports that she had some benefit after the trochanteric and subacromial bursa injection from the December 2019 visit.  However, about a month ago over the benefit was worn off.  She currently has pain in both hips and also pain in the right shoulder.  The pain in the right shoulder worsens when she raises her arm over her head.  She does not note weakness.  The pain in the hips is worse with walking.  She gets some benefit from Percocet.  Meloxicam has helped better than ibuprofen and she would like to get back on that.  She also uses the diclofenac gel over the areas that hurt the most.  Neck pain is doing about the same.  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  The neck pain is worse with movements.   Observations/Objective: She is a well-developed well-nourished woman in no acute distress.  The head is normocephalic and atraumatic.  Range of motion was normal in the neck.  Range of motion appeared to be normal in the shoulders.  Visible skin appears normal.  She is alert and fully oriented with fluent speech and good focus, attention and memory.  Extraocular muscles were intact.   Facial strength and trapezius strength was normal.  She appeared to have normal strength in the arms.  Finger-nose-finger was performed well.  Rapid rotating movements were performed well.  More formal strength testing, sensory testing and reflexes could not be performed.  Assessment and Plan: Subacromial bursitis, unspecified laterality  Back pain, lumbosacral - Plan: oxyCODONE-acetaminophen (PERCOCET) 10-325 MG tablet  Trochanteric bursitis, left hip  Trochanteric bursitis, right hip  Migraine without aura and without status migrainosus, not intractable  Neck pain  Chronic midline low back pain without sciatica  1.   Since the pain has flared up again, I will call in a steroid pack. 2.   Renew Percocet.  The [place] controlled substance database was reviewed and she is compliant.  She has not shown drug-seeking behavior. 3.   Try to stay active and exercise as tolerated. 4.   Return to clinic in 4 months, sooner if new or worsening neurologic symptoms.  Follow Up Instructions: I discussed the assessment and treatment plan with the patient. The patient was provided an opportunity to ask questions and all were answered. The patient agreed with the plan and demonstrated an understanding of the instructions.   The patient was advised to call back or seek an in-person evaluation if the symptoms worsen or if the condition fails to improve as anticipated.  I provided 20 minutes of non-face-to-face time during this encounter.   Britt Bottom, MD   Update 04/16/2018: She is reporting pain in the  hips, over the trochanteric bursa.   Pain increases if she walks more.   In the past trochanteric bursae injections have helped a while.   She also notes new pain in her feet, especially the 4th and 5th digits.     She also has some pain in the lower back but this is not as bad as the hip pain.  She also is reporting pain in her right shoulder.   Pain increases with rotation some.   She  also has neck pain and known C3-C6 DJD   She has breast cancer,  She is done with RadRx and still is on tamoxifen.    Her chest pain is better.     Update 12/11/2017: She had recent reconstructive surgery after breast cancer and is more sore in general (Dr. Doreen Salvage;  Also sees Dr. [facility]).   She is on tamoxifen and did RadRx.      She has pain everywhere.   She has pain in both hips and that is her worse pain.   Trochanteric bursa injections have helped in the past.   Due to her recent surgery, she would prefer to hold off on any injections today.     She also notes neck pain, worse with movements.   She has multilevel DJD C3-C6.       Update 08/05/2017: She is reporting more hip pain, bilaterally.    Pain increases if she lays on that side.  Trochanteric injections ususally help for a few months.    She also has back pain.  She has milder neck pain, worse with tirning he head.   Shoulders are only mildly tender  Update 06/04/2017: Neck, back and shoulder pain improved after the trigger point injections in October but have returned over the last month.      MRI cervical spine showed DJD at C3C4-C5C6.    Her worse pain is in the left shoulder.   Pain increases if she lays on that side.    She also has right shoulder region pain.   She also has neck pain that increases as the day goes on.   Her LB is hurting but not as bad as the neck and shoulders.   Hipas are hurting a lot over the bursae.    Bursa injections have helped in the past.    She sees Dr. Harlow Asa for breast cancer.   She had surgery and radiation and will be starting a pill x 5 days  Update 01/30/2017:    Since the last visit, her neck pain has greatly increased. She is undergoing radiation for breast cancer and she notes that laying down on the table is painful for her.   The pain is in the left neck more than the right. Additionally she is noticing more lower back pain also little worse on the left than the right. She denies any  change in strength or sensation in the arms or legs. There is no change in her bladder.    Her hip and shoulder pain improved quite a bit after the joint injections at her last visit. ________________________________ From 01/30/2017 LBP/hip pain:   She reports bilateral lower back pain and pain that is in the buttock and hip region, generally worse on the right.   Pain does not radiate into her legs. Pain is worse with walking and with standing up from a seated position.  In the past, trochanteric bursa injection has helped.   Neck/Shoulder pain:   She reports  pain in the neck and shoulders, worse on her right in the neck but in the left > right shulder.    Pain is worse with arms elevated,    Her pain also increases with external rotation and elevation.  Subacromial bursa injection often helped in the past.   However, she has had some itching following the steroid shots and it was especially bad after the last one 3-4 months ago.    ROM is mildly reduced in shoulders.    No arm weakness.   Breast cancer:   She needed surgery x 2 and is currently on RadRx for breast cancer.       Med's:  For her pain she is on Percocet 4 times a day with benefit.  Sheis also on meloxicam and tizanidine.   She has not shown any drug-seeking behavior       Migraine headaches occur about once a week. If Percocet does not help the migraine she will take a meloxicam.    REVIEW OF SYSTEMS:  Constitutional: No fevers, chills, sweats, or change in appetite Eyes: No visual changes, double vision, eye pain Ear, nose and throat: No hearing loss, ear pain, nasal congestion, sore throat Cardiovascular: No chest pain, palpitations Respiratory:  No shortness of breath at rest or with exertion.   No wheezes GastrointestinaI: No nausea, vomiting, diarrhea, abdominal pain, fecal incontinence Genitourinary:  No dysuria, urinary retention or frequency.  No nocturia. Musculoskeletal: as above Integumentary: No rash, pruritus,  skin lesions Neurological: as above Psychiatric: She has som depression since breast cancer dx.  Endocrine: No palpitations, diaphoresis, change in appetite, change in weigh or increased thirst  ALLERGIES: Allergies  Allergen Reactions  . Morphine And Related Nausea And Vomiting and Swelling  . Penicillins   . Sulfa Antibiotics     HOME MEDICATIONS: Outpatient Medications Prior to Visit  Medication Sig Dispense Refill  . amLODipine (NORVASC) 5 MG tablet Take 1 tablet (5 mg total) by mouth daily. 90 tablet 0  . diclofenac sodium (VOLTAREN) 1 % GEL Apply 2 g topically 4 (four) times daily as needed. 200 g 5  . fluconazole (DIFLUCAN) 150 MG tablet TAKE 1 TABLET BY MOUTH ONCE DAILY 15 tablet 0  . Fluticasone Propionate (XHANCE) 93 MCG/ACT EXHU Place 2 puffs into both nostrils 2 (two) times daily. (Patient not taking: Reported on 07/28/2018) 32 mL 12  . hydrOXYzine (VISTARIL) 25 MG capsule Take 1 capsule (25 mg total) by mouth 3 (three) times daily as needed. 60 capsule 1  . levocetirizine (XYZAL) 5 MG tablet Take 5 mg by mouth every evening.    . Multiple Vitamins-Minerals (MULTIVITAMIN ADULT PO)     . promethazine (PHENERGAN) 25 MG tablet 25 mg as needed.     . tamoxifen (NOLVADEX) 20 MG tablet Take 20 mg by mouth daily.    Marland Kitchen triamterene-hydrochlorothiazide (MAXZIDE-25) 37.5-25 MG tablet Take 0.5 tablets by mouth daily. 90 tablet 1  . meloxicam (MOBIC) 15 MG tablet Take 1 tablet (15 mg total) by mouth daily. (Patient taking differently: Take 15 mg by mouth as needed. ) 30 tablet 5  . oxyCODONE-acetaminophen (PERCOCET) 10-325 MG tablet Take 1 tablet by mouth every 6 (six) hours as needed for pain. 120 tablet 0   No facility-administered medications prior to visit.     PAST MEDICAL HISTORY: Past Medical History:  Diagnosis Date  . Anxiety   . Cancer (HCC)    breast  . Chronic back pain    takes oxycodone  .  Complication of anesthesia   . Eczema   . Environmental allergies   .  Headache   . Hypertension   . PONV (postoperative nausea and vomiting)   . Vertigo     PAST SURGICAL HISTORY: Past Surgical History:  Procedure Laterality Date  . BREAST REDUCTION SURGERY Left 11/20/2017   Procedure: left breast reduction for symmetry and liposuction bilateral lateral breasts;  Surgeon: Wallace Going, DO;  Location: [facility];  Service: Plastics;  Laterality: Left;  . BREAST SURGERY     rt breast lumpectomy  . LIPOSUCTION    . TUBAL LIGATION      FAMILY HISTORY: Family History  Problem Relation Age of Onset  . Hypertension Mother     SOCIAL HISTORY:  Social History   Socioeconomic History  . Marital status: Legally Separated    Spouse name: Not on file  . Number of children: Not on file  . Years of education: Not on file  . Highest education level: Not on file  Occupational History  . Not on file  Social Needs  . Financial resource strain: Not on file  . Food insecurity:    Worry: Not on file    Inability: Not on file  . Transportation needs:    Medical: Not on file    Non-medical: Not on file  Tobacco Use  . Smoking status: Former Smoker    Last attempt to quit: 05/17/1974    Years since quitting: 44.2  . Smokeless tobacco: Never Used  Substance and Sexual Activity  . Alcohol use: Yes    Alcohol/week: 7.0 standard drinks    Types: 7 Standard drinks or equivalent per week    Comment: one glass of wine with dinner/fim  . Drug use: No    Types: Marijuana  . Sexual activity: Not Currently  Lifestyle  . Physical activity:    Days per week: Not on file    Minutes per session: Not on file  . Stress: Not on file  Relationships  . Social connections:    Talks on phone: Not on file    Gets together: Not on file    Attends religious service: Not on file    Active member of club or organization: Not on file    Attends meetings of clubs or organizations: Not on file    Relationship status: Not on file  . Intimate partner  violence:    Fear of current or ex partner: Not on file    Emotionally abused: Not on file    Physically abused: Not on file    Forced sexual activity: Not on file  Other Topics Concern  . Not on file  Social History Narrative  . Not on file     PHYSICAL EXAM  There were no vitals filed for this visit.  There is no height or weight on file to calculate BMI.   General: The patient is well-developed and well-nourished and in no acute distress   Musculoskeletal:    She is tender over the right greater than left subacromial bursa.  There also is some tenderness over the trapezius and paraspinal muscles on the right more than left.  She is tender over the trochanteric bursae bilaterally with milder tenderness over the piriformis muscles bilaterally.   Neurologic Exam  Mental status: The patient is alert and oriented x 3 at the time of the examination. The patient has apparent normal recent and remote memory, with an apparently normal attention span and concentration  ability.    Cranial nerves:    Facial strength is normal.  Trapezius strength is normal.  No dysarthria is noted.    No obvious hearing deficits are noted.  Motor:  Muscle bulk and tone are normal. Strength is 5/5 in the arms or legs.   Gait and station: Station is normal.  The gait is arthritic.  Tandem gait is mildly wide.  DTRs:   Reflexes are normal and symmetric in the arms and legs.     DIAGNOSTIC DATA (LABS, IMAGING, TESTING) - I reviewed patient records, labs, notes, testing and imaging myself where available.     ASSESSMENT AND PLAN   Subacromial bursitis, unspecified laterality  Back pain, lumbosacral - Plan: oxyCODONE-acetaminophen (PERCOCET) 10-325 MG tablet  Trochanteric bursitis, left hip  Trochanteric bursitis, right hip  Migraine without aura and without status migrainosus, not intractable  Neck pain  Chronic midline low back pain without sciatica   1.   Inject bilateral trochanteric  bursae (hips) with 60 mg Depo-Medrol in [place] using sterile technique.  She tolerated the procedure well and there were no complications. 2.   Inject the right subacromial bursa (shoulder) with 20 mg Depo-Medrol and Marcaine using sterile technique.  She tolerated the procedure well there were no complications. 3.   Renew Percocet.  The [place] controlled substance database was reviewed.  Continue other medications. 3.   Try to stay active and exercises tolerated. 4.    Renew diclofenac gel.   5.    She will return to see me in 4 months, sooner if she  has new or worsening neurologic symptoms.    Tamekia Rotter A. Felecia Shelling, MD, PhD 01/05/8337, 25:05 AM Certified in Neurology, Clinical Neurophysiology, Sleep Medicine, Pain Medicine and Neuroimaging  [facility] [address] [place],  [address] [phone]

## 2018-07-30 ENCOUNTER — Telehealth: Payer: Self-pay | Admitting: *Deleted

## 2018-07-30 NOTE — Telephone Encounter (Signed)
Faxed printed/signed rx prednisone to [facility] at [phone]. Received fax confirmation.

## 2018-08-19 ENCOUNTER — Ambulatory Visit: Payer: Medicare HMO | Admitting: Neurology

## 2018-08-28 ENCOUNTER — Other Ambulatory Visit: Payer: Self-pay | Admitting: Neurology

## 2018-08-28 DIAGNOSIS — M545 Low back pain, unspecified: Secondary | ICD-10-CM

## 2018-08-28 NOTE — Telephone Encounter (Signed)
Pt is needing a refill on her oxyCODONE-acetaminophen (PERCOCET) 10-325 MG tablet sent to [facility] on [address]

## 2018-08-31 MED ORDER — OXYCODONE-ACETAMINOPHEN 10-325 MG PO TABS: 1.0000 | ORAL_TABLET | Freq: Four times a day (QID) | ORAL | 0 refills | Status: DC | PRN

## 2018-09-16 ENCOUNTER — Other Ambulatory Visit: Payer: Self-pay | Admitting: Family Medicine

## 2018-09-16 ENCOUNTER — Ambulatory Visit (INDEPENDENT_AMBULATORY_CARE_PROVIDER_SITE_OTHER): Payer: Medicare HMO | Admitting: Family Medicine

## 2018-09-16 ENCOUNTER — Encounter: Payer: Self-pay | Admitting: Family Medicine

## 2018-09-16 ENCOUNTER — Other Ambulatory Visit: Payer: Self-pay

## 2018-09-16 VITALS — BP 140/78 | HR 100 | Temp 98.3°F | Ht 65.0 in | Wt 240.0 lb

## 2018-09-16 DIAGNOSIS — Z1211 Encounter for screening for malignant neoplasm of colon: Secondary | ICD-10-CM | POA: Diagnosis not present

## 2018-09-16 DIAGNOSIS — Z8711 Personal history of peptic ulcer disease: Secondary | ICD-10-CM

## 2018-09-16 DIAGNOSIS — Z8719 Personal history of other diseases of the digestive system: Secondary | ICD-10-CM | POA: Diagnosis not present

## 2018-09-16 DIAGNOSIS — I10 Essential (primary) hypertension: Secondary | ICD-10-CM | POA: Diagnosis not present

## 2018-09-16 DIAGNOSIS — Z0000 Encounter for general adult medical examination without abnormal findings: Secondary | ICD-10-CM | POA: Diagnosis not present

## 2018-09-16 LAB — CBC
HCT: 41.2 % (ref 36.0–46.0)
Hemoglobin: 13.7 g/dL (ref 12.0–15.0)
MCHC: 33.2 g/dL (ref 30.0–36.0)
MCV: 91.3 fl (ref 78.0–100.0)
Platelets: 235 10*3/uL (ref 150.0–400.0)
RBC: 4.51 Mil/uL (ref 3.87–5.11)
RDW: 14.3 % (ref 11.5–15.5)
WBC: 4 10*3/uL (ref 4.0–10.5)

## 2018-09-16 LAB — COMPREHENSIVE METABOLIC PANEL
ALT: 16 U/L (ref 0–35)
AST: 17 U/L (ref 0–37)
Albumin: 4 g/dL (ref 3.5–5.2)
Alkaline Phosphatase: 46 U/L (ref 39–117)
BUN: 17 mg/dL (ref 6–23)
CO2: 27 mEq/L (ref 19–32)
Calcium: 8.6 mg/dL (ref 8.4–10.5)
Chloride: 106 mEq/L (ref 96–112)
Creatinine, Ser: 0.76 mg/dL (ref 0.40–1.20)
GFR: 92.65 mL/min (ref >60.00)
Glucose, Bld: 89 mg/dL (ref 70–99)
Potassium: 3.8 mEq/L (ref 3.5–5.1)
Sodium: 142 mEq/L (ref 135–145)
Total Bilirubin: 0.3 mg/dL (ref 0.2–1.2)
Total Protein: 7.3 g/dL (ref 6.0–8.3)

## 2018-09-16 LAB — LIPID PANEL
Cholesterol: 208 mg/dL — ABNORMAL HIGH (ref 0–200)
HDL: 45.4 mg/dL (ref >39.00)
LDL Cholesterol: 129 mg/dL — ABNORMAL HIGH (ref 0–99)
NonHDL: 162.43
Total CHOL/HDL Ratio: 5
Triglycerides: 168 mg/dL — ABNORMAL HIGH (ref 0.0–149.0)
VLDL: 33.6 mg/dL (ref 0.0–40.0)

## 2018-09-16 LAB — TSH: TSH: 1.2 u[IU]/mL (ref 0.35–4.50)

## 2018-09-16 LAB — VITAMIN D 25 HYDROXY (VIT D DEFICIENCY, FRACTURES): VITD: 26.87 ng/mL — ABNORMAL LOW (ref 30.00–100.00)

## 2018-09-16 MED ORDER — BENZONATATE 100 MG PO CAPS: 100.0000 mg | ORAL_CAPSULE | Freq: Three times a day (TID) | ORAL | 0 refills | Status: DC | PRN

## 2018-09-16 MED ORDER — TRIAMTERENE-HCTZ 37.5-25 MG PO TABS: 0.5000 | ORAL_TABLET | Freq: Every day | ORAL | 2 refills | Status: DC

## 2018-09-16 MED ORDER — LEVOCETIRIZINE DIHYDROCHLORIDE 5 MG PO TABS: 5.0000 mg | ORAL_TABLET | Freq: Every evening | ORAL | 2 refills | Status: DC

## 2018-09-16 MED ORDER — FLUCONAZOLE 150 MG PO TABS: 150.0000 mg | ORAL_TABLET | Freq: Every day | ORAL | 0 refills | Status: DC

## 2018-09-16 MED ORDER — PANTOPRAZOLE SODIUM 40 MG PO TBEC: 40.0000 mg | DELAYED_RELEASE_TABLET | Freq: Every day | ORAL | 1 refills | Status: DC

## 2018-09-16 MED ORDER — FLUTICASONE PROPIONATE 93 MCG/ACT NA EXHU: 2.0000 | INHALANT_SUSPENSION | Freq: Two times a day (BID) | NASAL | 12 refills | Status: DC

## 2018-09-16 NOTE — Patient Instructions (Addendum)
Let us know if you need anything.  Keep the diet clean and stay active.  If you do not hear anything about your referral in the next 1-2 weeks, call our office and ask for an update.  Let us know if you need anything.

## 2018-09-16 NOTE — Progress Notes (Signed)
Chief Complaint  Patient presents with  . Annual Exam     Well Woman Kimberly Mann is here for a complete physical.   Her last physical was >1 year ago.  Current diet: in general, a "healthy" diet. Got worse since pandemic. Current exercise: none. Weight is increasing a little and she denies daytime fatigue out of the ordinary. No LMP recorded. Patient is postmenopausal.  Seatbelt? Yes  Health Maintenance Pap/HPV- Yes Mammogram- Yes- scheduled for June 11 Tetanus- Yes Hep C screening- Yes HIV screening- Yes  Past Medical History:  Diagnosis Date  . Anxiety   . Cancer (HCC)    breast  . Chronic back pain    takes oxycodone  . Complication of anesthesia   . Eczema   . Environmental allergies   . Headache   . Hypertension   . PONV (postoperative nausea and vomiting)   . Vertigo      Past Surgical History:  Procedure Laterality Date  . BREAST REDUCTION SURGERY Left 11/20/2017   Procedure: left breast reduction for symmetry and liposuction bilateral lateral breasts;  Surgeon: Wallace Going, DO;  Location: [facility];  Service: Plastics;  Laterality: Left;  . BREAST SURGERY     rt breast lumpectomy  . LIPOSUCTION    . TUBAL LIGATION      Medications  Current Outpatient Medications on File Prior to Visit  Medication Sig Dispense Refill  . amLODipine (NORVASC) 5 MG tablet Take 1 tablet (5 mg total) by mouth daily. 90 tablet 0  . diclofenac sodium (VOLTAREN) 1 % GEL Apply 2 g topically 4 (four) times daily as needed. 200 g 5  . fluconazole (DIFLUCAN) 150 MG tablet TAKE 1 TABLET BY MOUTH ONCE DAILY 15 tablet 0  . Fluticasone Propionate (XHANCE) 93 MCG/ACT EXHU Place 2 puffs into both nostrils 2 (two) times daily. 32 mL 12  . hydrOXYzine (VISTARIL) 25 MG capsule Take 1 capsule (25 mg total) by mouth 3 (three) times daily as needed. 60 capsule 1  . levocetirizine (XYZAL) 5 MG tablet Take 5 mg by mouth every evening.    . meloxicam (MOBIC) 15 MG  tablet Take 1 tablet (15 mg total) by mouth as needed. 90 tablet 1  . Multiple Vitamins-Minerals (MULTIVITAMIN ADULT PO)     . oxyCODONE-acetaminophen (PERCOCET) 10-325 MG tablet Take 1 tablet by mouth every 6 (six) hours as needed for pain. 120 tablet 0  . promethazine (PHENERGAN) 25 MG tablet 25 mg as needed.     . tamoxifen (NOLVADEX) 20 MG tablet Take 20 mg by mouth daily.    Marland Kitchen triamterene-hydrochlorothiazide (MAXZIDE-25) 37.5-25 MG tablet Take 0.5 tablets by mouth daily. 90 tablet 1   Allergies Allergies  Allergen Reactions  . Morphine And Related Nausea And Vomiting and Swelling  . Penicillins   . Sulfa Antibiotics     Review of Systems: Constitutional:  no unexpected weight changes Eye:  no recent significant change in vision Ear/Nose/Mouth/Throat:  Ears:  no tinnitus or vertigo and no recent change in hearing Nose/Mouth/Throat:  no complaints of nasal congestion, no sore throat Cardiovascular: no chest pain Respiratory:  no cough and no shortness of breath Gastrointestinal:  + abdominal pain, no change in bowel habits GU:  Female: negative for dysuria or pelvic pain Musculoskeletal/Extremities:  no pain of the joints Integumentary (Skin/Breast):  no abnormal skin lesions reported Neurologic:  no headaches Endocrine:  denies fatigue Hematologic/Lymphatic:  No areas of easy bleeding  Exam BP 140/78 (BP Location: Left  Arm, Patient Position: Sitting, Cuff Size: Large)   Pulse 100   Temp 98.3 F (36.8 C) (Oral)   Ht 5\' 5"  (1.651 m)   Wt 240 lb (108.9 kg)   SpO2 96%   BMI 39.94 kg/m  General:  well developed, well nourished, in no apparent distress Skin:  no significant moles, warts, or growths Head:  no masses, lesions, or tenderness Eyes:  pupils equal and round, sclera anicteric without injection Ears:  canals without lesions, TMs shiny without retraction, no obvious effusion, no erythema Nose:  nares patent, septum midline, mucosa normal, and no drainage or sinus  tenderness Throat/Pharynx:  lips and gingiva without lesion; tongue and uvula midline; non-inflamed pharynx; no exudates or postnasal drainage Neck: neck supple without adenopathy, thyromegaly, or masses Lungs:  clear to auscultation, breath sounds equal bilaterally, no respiratory distress Cardio:  regular rate and rhythm, no bruits, no LE edema Abdomen:  abdomen soft, nontender; bowel sounds normal; no masses or organomegaly Genital: Defer to GYN Musculoskeletal:  symmetrical muscle groups noted without atrophy or deformity Extremities:  no clubbing, cyanosis, or edema, no deformities, no skin discoloration Neuro:  gait normal; deep tendon reflexes normal and symmetric Psych: well oriented with normal range of affect and appropriate judgment/insight  Assessment and Plan  Well adult exam - Plan: Lipid panel, CBC, Comprehensive metabolic panel, Vitamin D (25 hydroxy), TSH  Essential hypertension - Plan: triamterene-hydrochlorothiazide (MAXZIDE-25) 37.5-25 MG tablet  Screen for colon cancer - Plan: Ambulatory referral to Gastroenterology  History of gastric ulcer - Plan: Ambulatory referral to Gastroenterology, pantoprazole (PROTONIX) 40 MG tablet   Well 63 y.o. female. Counseled on diet and exercise. Other orders as above. Follow up 6 mo. The patient voiced understanding and agreement to the plan.  [place], DO 09/16/18 10:55 AM

## 2018-09-23 ENCOUNTER — Other Ambulatory Visit: Payer: Self-pay | Admitting: Neurology

## 2018-09-23 DIAGNOSIS — M545 Low back pain, unspecified: Secondary | ICD-10-CM

## 2018-09-23 MED ORDER — OXYCODONE-ACETAMINOPHEN 10-325 MG PO TABS: 1.0000 | ORAL_TABLET | Freq: Four times a day (QID) | ORAL | 0 refills | Status: DC | PRN

## 2018-09-23 NOTE — Telephone Encounter (Signed)
Pt called for a refill on her  oxyCODONE-acetaminophen (PERCOCET) 10-325 MG tablet [place] [id number]

## 2018-09-23 NOTE — Addendum Note (Signed)
Addended by: Hope Pigeon on: 09/23/2018 02:04 PM   Modules accepted: Orders

## 2018-10-13 DIAGNOSIS — R922 Inconclusive mammogram: Secondary | ICD-10-CM | POA: Diagnosis not present

## 2018-10-13 DIAGNOSIS — C50511 Malignant neoplasm of lower-outer quadrant of right female breast: Secondary | ICD-10-CM | POA: Diagnosis not present

## 2018-10-13 DIAGNOSIS — Z170 Estrogen receptor positive status [ER+]: Secondary | ICD-10-CM | POA: Diagnosis not present

## 2018-10-13 DIAGNOSIS — Z853 Personal history of malignant neoplasm of breast: Secondary | ICD-10-CM | POA: Diagnosis not present

## 2018-10-21 DIAGNOSIS — C50511 Malignant neoplasm of lower-outer quadrant of right female breast: Secondary | ICD-10-CM | POA: Diagnosis not present

## 2018-10-21 DIAGNOSIS — Z170 Estrogen receptor positive status [ER+]: Secondary | ICD-10-CM | POA: Diagnosis not present

## 2018-10-27 ENCOUNTER — Other Ambulatory Visit: Payer: Self-pay | Admitting: Neurology

## 2018-10-27 DIAGNOSIS — M545 Low back pain, unspecified: Secondary | ICD-10-CM

## 2018-10-27 MED ORDER — OXYCODONE-ACETAMINOPHEN 10-325 MG PO TABS: 1.0000 | ORAL_TABLET | Freq: Four times a day (QID) | ORAL | 0 refills | Status: DC | PRN

## 2018-10-27 NOTE — Telephone Encounter (Signed)
Pt is requesting a refill of oxyCODONE-acetaminophen (PERCOCET) 10-325 MG tablet, to be sent to [place], [place]

## 2018-11-17 ENCOUNTER — Other Ambulatory Visit: Payer: Self-pay | Admitting: Family Medicine

## 2018-11-17 DIAGNOSIS — Z8711 Personal history of peptic ulcer disease: Secondary | ICD-10-CM

## 2018-11-17 DIAGNOSIS — Z8719 Personal history of other diseases of the digestive system: Secondary | ICD-10-CM

## 2018-11-18 ENCOUNTER — Other Ambulatory Visit: Payer: Self-pay | Admitting: Family Medicine

## 2018-11-18 DIAGNOSIS — Z8711 Personal history of peptic ulcer disease: Secondary | ICD-10-CM

## 2018-11-18 DIAGNOSIS — Z8719 Personal history of other diseases of the digestive system: Secondary | ICD-10-CM

## 2018-11-18 MED ORDER — PANTOPRAZOLE SODIUM 40 MG PO TBEC: 40.0000 mg | DELAYED_RELEASE_TABLET | Freq: Every day | ORAL | 3 refills | Status: DC

## 2018-11-19 ENCOUNTER — Ambulatory Visit (INDEPENDENT_AMBULATORY_CARE_PROVIDER_SITE_OTHER): Payer: Medicare HMO | Admitting: Family Medicine

## 2018-11-19 ENCOUNTER — Encounter: Payer: Self-pay | Admitting: Family Medicine

## 2018-11-19 ENCOUNTER — Other Ambulatory Visit: Payer: Self-pay

## 2018-11-19 VITALS — BP 136/84 | HR 82 | Temp 98.4°F | Resp 16 | Ht 64.6 in | Wt 231.6 lb

## 2018-11-19 DIAGNOSIS — J3089 Other allergic rhinitis: Secondary | ICD-10-CM

## 2018-11-19 DIAGNOSIS — T781XXD Other adverse food reactions, not elsewhere classified, subsequent encounter: Secondary | ICD-10-CM | POA: Diagnosis not present

## 2018-11-19 DIAGNOSIS — R42 Dizziness and giddiness: Secondary | ICD-10-CM

## 2018-11-19 DIAGNOSIS — T50905D Adverse effect of unspecified drugs, medicaments and biological substances, subsequent encounter: Secondary | ICD-10-CM

## 2018-11-19 MED ORDER — AZELASTINE HCL 0.15 % NA SOLN: 2.0000 | Freq: Two times a day (BID) | NASAL | 5 refills | Status: DC

## 2018-11-19 NOTE — Patient Instructions (Addendum)
Allergic rhinitis Your skin testing today perennial molds and equivocal to dust mites. Avoidance measures are listed below. Begin Xhance 2 sprays in each nostril twice a day for nasal congestion. This will replace Flonase Begin azelastine 1-2 sprays in each nostril twice a day as needed for a runny nose or sneezing Continue Xyzal 5 mg once a day as needed for a runny nose or itch Continue saline nasal rinses as needed for nasal symptoms. Use this before medicated nasal sprays  Adverse food reaction Your skin testing today was negative to wheat, shellfish mix, salmon, crab, lobster, oyster, scallop, tomato, avocado, grape, and orange. Continue to avoid sulfa-containing foods  Pruritus Continue Xyzal once or twice a day as needed for itch Begin famotidine 20 mg twice a day as needed for itch Continue a daily moisturizing routine If your itching continues, begin a journal of events that occurred for up to 6 hours before your symptoms began including foods and beverages consumed, soaps or perfumes you had contact with, and medications.  List of foods with salicylates provided  Drug allergy Continue to avoid penicillin and ciprofloxacin. Recommend penicillin skin testing. If favorable, recommend an in office penicillin drug challenge.  Follow up with your primary care provider for your symptoms of vertigo  Call the clinic if this treatment plan is not working well for you  Follow up in 6 weeks or sooner if needed  Control of [place] dust mites play a major role in allergic asthma and rhinitis.  They occur in environments with high humidity wherever human skin, the food for dust mites is found. High levels have been detected in dust obtained from mattresses, pillows, carpets, upholstered furniture, bed covers, clothes and soft toys.  The principal allergen of the house dust mite is found in its feces.  A gram of dust may contain 1,000 mites and 250,000 fecal particles.   Mite antigen is easily measured in the air during house cleaning activities.    1. Encase mattresses, including the box spring, and pillow, in an air tight cover.  Seal the zipper end of the encased mattresses with wide adhesive tape. 2. Wash the bedding in water of 130 degrees Farenheit weekly.  Avoid cotton comforters/quilts and flannel bedding: the most ideal bed covering is the dacron comforter. 3. Remove all upholstered furniture from the bedroom. 4. Remove carpets, carpet padding, rugs, and non-washable window drapes from the bedroom.  Wash drapes weekly or use plastic window coverings. 5. Remove all non-washable stuffed toys from the bedroom.  Wash stuffed toys weekly. 6. Have the room cleaned frequently with a vacuum cleaner and a damp dust-mop.  The patient should not be in a room which is being cleaned and should wait 1 hour after cleaning before going into the room. 7. Close and seal all heating outlets in the bedroom.  Otherwise, the room will become filled with dust-laden air.  An electric heater can be used to heat the room. 8. Reduce indoor humidity to less than 50%.  Do not use a humidifier.  Control of Mold Allergen Mold and fungi can grow on a variety of surfaces provided certain temperature and moisture conditions exist.  Outdoor molds grow on plants, decaying vegetation and soil.  The major outdoor mold, Alternaria and Cladosporium, are found in very high numbers during hot and dry conditions.  Generally, a late Summer - Fall peak is seen for common outdoor fungal spores.  Rain will temporarily lower outdoor mold spore count, but  counts rise rapidly when the rainy period ends.  The most important indoor molds are Aspergillus and Penicillium.  Dark, humid and poorly ventilated basements are ideal sites for mold growth.  The next most common sites of mold growth are the bathroom and the kitchen.  Outdoor [employer] 1. Use air conditioning and keep windows closed 2. Avoid exposure  to decaying vegetation. 3. Avoid leaf raking. 4. Avoid grain handling. 5. Consider wearing a face mask if working in moldy areas.  Indoor Mold Control 1. Maintain humidity below 50%. 2. Clean washable surfaces with 5% bleach solution. 3. Remove sources e.g. Contaminated carpets.

## 2018-11-19 NOTE — Progress Notes (Addendum)
[address] [place] [address] Dept: [phone]  FOLLOW UP NOTE  Patient ID: Kimberly Mann, female    DOB: Jul 28, 1955  Age: 63 y.o. MRN: [id number] Date of Office Visit: 11/19/2018  Assessment  Chief Complaint: Allergies  HPI Kimberly Mann is a 63 year old female who presents to the clinic for a follow up visit. She was last seen in this clinic on 03/20/2018 by Dr. Maudie Mercury for evaluation of chronic rhinitis, vertigo, food allergy to sulfa-containing foods, and drug reactions to penicillin and ciprofloxacin. At today's visit, she reports that she continues to experience nasal congestion, post nasal drainage with cough producing clear mucus once a day, and itching over her whole body when coming in from outside. She denies nasal drainage and shortness of breath. She continues to avoid foods with sulfites. She believes that some of the foods in her diet may be contributing to her pruritus. She does not experience hives, cardiopulmonary or gastrointestinal issues with the itch. She has not taken any antihistamines for the last 3 days.    Drug Allergies:  Allergies  Allergen Reactions  . Morphine And Related Nausea And Vomiting and Swelling  . Penicillins   . Sulfa Antibiotics     Physical Exam: BP 136/84   Pulse 82   Temp 98.4 F (36.9 C) (Oral)   Resp 16   Ht 5' 4.6" (1.641 m)   Wt 231 lb 9.6 oz (105.1 kg)   SpO2 97%   BMI 39.02 kg/m    Physical Exam Vitals signs reviewed.  Constitutional:      Appearance: Normal appearance.  HENT:     Head: Normocephalic and atraumatic.     Right Ear: Tympanic membrane normal.     Left Ear: Tympanic membrane normal.     Nose:     Comments: Bilateral nares slightly erythematous with clear nasal drainage. Pharynx normal. Ears normal. Eyes normal.    Mouth/Throat:     Pharynx: Oropharynx is clear.  Eyes:     Conjunctiva/sclera: Conjunctivae normal.  Neck:     Musculoskeletal: Normal range of motion.  Cardiovascular:    Rate and Rhythm: Normal rate and regular rhythm.     Heart sounds: Normal heart sounds. No murmur.  Pulmonary:     Effort: Pulmonary effort is normal.     Breath sounds: Normal breath sounds.     Comments: Lungs clear to auscultation Musculoskeletal: Normal range of motion.  Skin:    General: Skin is warm and dry.  Neurological:     Mental Status: She is alert and oriented to person, place, and time.  Psychiatric:        Mood and Affect: Mood normal.        Behavior: Behavior normal.        Thought Content: Thought content normal.        Judgment: Judgment normal.     Diagnostics: Percutaneous environmental skin testing was negative with adequate controls. Intradermal skin testing was positive to mold mix 4 and equivocal to dust mite with adequate controls.  Select food testing to wheat, shellfish mix, salmon, crab, lobster, oyster, scallop, tomato, avocado, grape, and orange was negative with adequate controls.  Assessment and Plan: 1. Non-seasonal allergic rhinitis due to fungal spores   2. Adverse effect of drug, subsequent encounter   3. Adverse food reaction, subsequent encounter   4. Vertigo     Meds ordered this encounter  Medications  . Azelastine HCl 0.15 % SOLN    Sig:  Place 2 sprays into both nostrils 2 (two) times daily.    Dispense:  30 mL    Refill:  5    Patient Instructions  Allergic rhinitis Your skin testing today perennial molds and equivocal to dust mites. Avoidance measures are listed below. Begin Xhance 2 sprays in each nostril twice a day for nasal congestion. This will replace Flonase Begin azelastine 1-2 sprays in each nostril twice a day as needed for a runny nose or sneezing Continue Xyzal 5 mg once a day as needed for a runny nose or itch Continue saline nasal rinses as needed for nasal symptoms. Use this before medicated nasal sprays  Adverse food reaction Your skin testing today was negative to wheat, shellfish mix, salmon, crab, lobster,  oyster, scallop, tomato, avocado, grape, and orange. Continue to avoid sulfa-containing foods  Pruritus Continue Xyzal once or twice a day as needed for itch Begin famotidine 20 mg twice a day as needed for itch Continue a daily moisturizing routine If your itching continues, begin a journal of events that occurred for up to 6 hours before your symptoms began including foods and beverages consumed, soaps or perfumes you had contact with, and medications.  List of foods with salicylates provided  Drug allergy Continue to avoid penicillin and ciprofloxacin. Recommend penicillin skin testing. If favorable, recommend an in office penicillin drug challenge.  Follow up with your primary care provider for your symptoms of vertigo  Call the clinic if this treatment plan is not working well for you  Follow up in 6 weeks or sooner if needed   Return in about 6 weeks (around 12/31/2018).    Thank you for the opportunity to care for this patient.  Please do not hesitate to contact me with questions.  Gareth Morgan, FNP [facility]

## 2018-11-26 ENCOUNTER — Other Ambulatory Visit: Payer: Self-pay | Admitting: Neurology

## 2018-11-26 DIAGNOSIS — M545 Low back pain, unspecified: Secondary | ICD-10-CM

## 2018-11-26 MED ORDER — OXYCODONE-ACETAMINOPHEN 10-325 MG PO TABS: 1.0000 | ORAL_TABLET | Freq: Four times a day (QID) | ORAL | 0 refills | Status: DC | PRN

## 2018-11-26 NOTE — Telephone Encounter (Signed)
Pt is requesting a refill of oxyCODONE-acetaminophen (PERCOCET) 10-325 MG tablet , to be sent to [place], [place]

## 2018-12-03 ENCOUNTER — Encounter: Payer: Self-pay | Admitting: Neurology

## 2018-12-03 ENCOUNTER — Other Ambulatory Visit: Payer: Self-pay

## 2018-12-03 ENCOUNTER — Ambulatory Visit (INDEPENDENT_AMBULATORY_CARE_PROVIDER_SITE_OTHER): Payer: Medicare HMO | Admitting: Neurology

## 2018-12-03 VITALS — BP 162/84 | HR 79 | Temp 98.0°F | Ht 64.0 in | Wt 232.0 lb

## 2018-12-03 DIAGNOSIS — M7061 Trochanteric bursitis, right hip: Secondary | ICD-10-CM

## 2018-12-03 DIAGNOSIS — M7552 Bursitis of left shoulder: Secondary | ICD-10-CM | POA: Diagnosis not present

## 2018-12-03 DIAGNOSIS — M7551 Bursitis of right shoulder: Secondary | ICD-10-CM | POA: Diagnosis not present

## 2018-12-03 DIAGNOSIS — M7062 Trochanteric bursitis, left hip: Secondary | ICD-10-CM

## 2018-12-03 DIAGNOSIS — R42 Dizziness and giddiness: Secondary | ICD-10-CM | POA: Diagnosis not present

## 2018-12-03 MED ORDER — HYDROXYZINE PAMOATE 25 MG PO CAPS: 25.0000 mg | ORAL_CAPSULE | Freq: Three times a day (TID) | ORAL | 1 refills | Status: DC | PRN

## 2018-12-03 MED ORDER — FLUCONAZOLE 150 MG PO TABS: 150.0000 mg | ORAL_TABLET | Freq: Every day | ORAL | 0 refills | Status: DC

## 2018-12-03 NOTE — Progress Notes (Signed)
t  [facility]  PATIENT: Kimberly Mann DOB: [birth year]  REFERRING CLINICIAN: [name]  HISTORY FROM: Patient  REASON FOR VISIT: Left shoulder and neck pain; back pain [  HISTORICAL  CHIEF COMPLAINT:  Chief Complaint  Patient presents with  . Follow-up    pt alone, rm 13.     HISTORY OF PRESENT ILLNESS:  Kimberly Mann is a 63 y.o. woman with neck, back, hip and shoulder pain.  Update 12/02/2018: She reports the worse of her pain is in the shoulders and hips.   Shoulder pain is worse with movements.  In the past, subacromial injections help the shoulder pain and trochanteric bursa injections have helped the hip pan.  She also has more vertigo.   She feels a spinning sensation when she looks up or changes position.   She has trouble walking when this is happening.  She has fallen out of her bed.   She has had some symptoms x 15 years, but this is worse recently.   She did vestibular therapy in the past.   Sometimes she has left ear pain.   She has seen ENT and was told she has peripheral vertigo.     Update 07/29/2018: She reports that she had some benefit after the trochanteric and subacromial bursa injection from the December 2019 visit.  However, about a month ago over the benefit was worn off.  She currently has pain in both hips and also pain in the right shoulder.  The pain in the right shoulder worsens when she raises her arm over her head.  She does not note weakness.  The pain in the hips is worse with walking.  She gets some benefit from Percocet.  Meloxicam has helped better than ibuprofen and she would like to get back on that.  She also uses the diclofenac gel over the areas that hurt the most.  Neck pain is doing about the same.  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  The neck pain is worse with movements.    Update 04/16/2018: She is reporting pain in the hips, over the trochanteric bursa.   Pain increases if she walks more.   In the past  trochanteric bursae injections have helped a while.   She also notes new pain in her feet, especially the 4th and 5th digits.     She also has some pain in the lower back but this is not as bad as the hip pain.  She also is reporting pain in her right shoulder.   Pain increases with rotation some.   She also has neck pain and known C3-C6 DJD   She has breast cancer,  She is done with RadRx and still is on tamoxifen.    Her chest pain is better.     Update 12/11/2017: She had recent reconstructive surgery after breast cancer and is more sore in general (Dr. Doreen Salvage;  Also sees Dr. [facility]).   She is on tamoxifen and did RadRx.      She has pain everywhere.   She has pain in both hips and that is her worse pain.   Trochanteric bursa injections have helped in the past.   Due to her recent surgery, she would prefer to hold off on any injections today.     She also notes neck pain, worse with movements.   She has multilevel DJD C3-C6.       Update 08/05/2017: She is reporting more hip pain, bilaterally.  Pain increases if she lays on that side.  Trochanteric injections ususally help for a few months.    She also has back pain.  She has milder neck pain, worse with tirning he head.   Shoulders are only mildly tender  Update 06/04/2017: Neck, back and shoulder pain improved after the trigger point injections in October but have returned over the last month.      MRI cervical spine showed DJD at C3C4-C5C6.    Her worse pain is in the left shoulder.   Pain increases if she lays on that side.    She also has right shoulder region pain.   She also has neck pain that increases as the day goes on.   Her LB is hurting but not as bad as the neck and shoulders.   Hipas are hurting a lot over the bursae.    Bursa injections have helped in the past.    She sees Dr. Harlow Asa for breast cancer.   She had surgery and radiation and will be starting a pill x 5 days  Update 01/30/2017:    Since the last visit, her  neck pain has greatly increased. She is undergoing radiation for breast cancer and she notes that laying down on the table is painful for her.   The pain is in the left neck more than the right. Additionally she is noticing more lower back pain also little worse on the left than the right. She denies any change in strength or sensation in the arms or legs. There is no change in her bladder.    Her hip and shoulder pain improved quite a bit after the joint injections at her last visit. ________________________________ From 01/30/2017 LBP/hip pain:   She reports bilateral lower back pain and pain that is in the buttock and hip region, generally worse on the right.   Pain does not radiate into her legs. Pain is worse with walking and with standing up from a seated position.  In the past, trochanteric bursa injection has helped.   Neck/Shoulder pain:   She reports pain in the neck and shoulders, worse on her right in the neck but in the left > right shulder.    Pain is worse with arms elevated,    Her pain also increases with external rotation and elevation.  Subacromial bursa injection often helped in the past.   However, she has had some itching following the steroid shots and it was especially bad after the last one 3-4 months ago.    ROM is mildly reduced in shoulders.    No arm weakness.   Breast cancer:   She needed surgery x 2 and is currently on RadRx for breast cancer.       Med's:  For her pain she is on Percocet 4 times a day with benefit.  Sheis also on meloxicam and tizanidine.   She has not shown any drug-seeking behavior       Migraine headaches occur about once a week. If Percocet does not help the migraine she will take a meloxicam.    REVIEW OF SYSTEMS:  Constitutional: No fevers, chills, sweats, or change in appetite Eyes: No visual changes, double vision, eye pain Ear, nose and throat: No hearing loss, ear pain, nasal congestion, sore throat Cardiovascular: No chest pain,  palpitations Respiratory:  No shortness of breath at rest or with exertion.   No wheezes GastrointestinaI: No nausea, vomiting, diarrhea, abdominal pain, fecal incontinence Genitourinary:  No dysuria, urinary  retention or frequency.  No nocturia. Musculoskeletal: as above Integumentary: No rash, pruritus, skin lesions Neurological: as above Psychiatric: She has som depression since breast cancer dx.  Endocrine: No palpitations, diaphoresis, change in appetite, change in weigh or increased thirst  ALLERGIES: Allergies  Allergen Reactions  . Morphine And Related Nausea And Vomiting and Swelling  . Penicillins   . Sulfa Antibiotics     HOME MEDICATIONS: Outpatient Medications Prior to Visit  Medication Sig Dispense Refill  . amLODipine (NORVASC) 5 MG tablet Take 1 tablet (5 mg total) by mouth daily. 90 tablet 0  . Azelastine HCl 0.15 % SOLN Place 2 sprays into both nostrils 2 (two) times daily. 30 mL 5  . diclofenac sodium (VOLTAREN) 1 % GEL Apply 2 g topically 4 (four) times daily as needed. 200 g 5  . fluconazole (DIFLUCAN) 150 MG tablet Take 1 tablet (150 mg total) by mouth daily. 15 tablet 0  . Fluticasone Propionate (XHANCE) 93 MCG/ACT EXHU Place 2 puffs into both nostrils 2 (two) times daily. 32 mL 12  . hydrOXYzine (VISTARIL) 25 MG capsule Take 1 capsule (25 mg total) by mouth 3 (three) times daily as needed. 60 capsule 1  . levocetirizine (XYZAL) 5 MG tablet Take 1 tablet (5 mg total) by mouth every evening. 90 tablet 2  . meloxicam (MOBIC) 15 MG tablet Take 1 tablet (15 mg total) by mouth as needed. 90 tablet 1  . Multiple Vitamins-Minerals (MULTIVITAMIN ADULT PO)     . oxyCODONE-acetaminophen (PERCOCET) 10-325 MG tablet Take 1 tablet by mouth every 6 (six) hours as needed for pain. 120 tablet 0  . pantoprazole (PROTONIX) 40 MG tablet Take 1 tablet (40 mg total) by mouth daily. 30 tablet 3  . promethazine (PHENERGAN) 25 MG tablet 25 mg as needed.     . tamoxifen (NOLVADEX) 20  MG tablet Take 20 mg by mouth daily.    Marland Kitchen triamterene-hydrochlorothiazide (MAXZIDE-25) 37.5-25 MG tablet Take 0.5 tablets by mouth daily. 90 tablet 2   No facility-administered medications prior to visit.     PAST MEDICAL HISTORY: Past Medical History:  Diagnosis Date  . Anxiety   . Cancer (HCC)    breast  . Chronic back pain    takes oxycodone  . Complication of anesthesia   . Eczema   . Environmental allergies   . Headache   . Hypertension   . PONV (postoperative nausea and vomiting)   . Vertigo     PAST SURGICAL HISTORY: Past Surgical History:  Procedure Laterality Date  . BREAST REDUCTION SURGERY Left 11/20/2017   Procedure: left breast reduction for symmetry and liposuction bilateral lateral breasts;  Surgeon: Wallace Going, DO;  Location: [facility];  Service: Plastics;  Laterality: Left;  . BREAST SURGERY     rt breast lumpectomy  . LIPOSUCTION    . TUBAL LIGATION      FAMILY HISTORY: Family History  Problem Relation Age of Onset  . Hypertension Mother     SOCIAL HISTORY:  Social History   Socioeconomic History  . Marital status: Legally Separated    Spouse name: Not on file  . Number of children: Not on file  . Years of education: Not on file  . Highest education level: Not on file  Occupational History  . Not on file  Social Needs  . Financial resource strain: Not on file  . Food insecurity    Worry: Not on file    Inability: Not on file  .  Transportation needs    Medical: Not on file    Non-medical: Not on file  Tobacco Use  . Smoking status: Former Smoker    Quit date: 05/17/1974    Years since quitting: 44.5  . Smokeless tobacco: Never Used  Substance and Sexual Activity  . Alcohol use: Yes    Alcohol/week: 7.0 standard drinks    Types: 7 Standard drinks or equivalent per week    Comment: one glass of wine with dinner/fim  . Drug use: No    Types: Marijuana  . Sexual activity: Not Currently  Lifestyle  .  Physical activity    Days per week: Not on file    Minutes per session: Not on file  . Stress: Not on file  Relationships  . Social Herbalist on phone: Not on file    Gets together: Not on file    Attends religious service: Not on file    Active member of club or organization: Not on file    Attends meetings of clubs or organizations: Not on file    Relationship status: Not on file  . Intimate partner violence    Fear of current or ex partner: Not on file    Emotionally abused: Not on file    Physically abused: Not on file    Forced sexual activity: Not on file  Other Topics Concern  . Not on file  Social History Narrative  . Not on file     PHYSICAL EXAM  Vitals:   12/03/18 1123  BP: (!) 162/84  Pulse: 79  Temp: 98 F (36.7 C)  Weight: 232 lb (105.2 kg)  Height: 5\' 4"  (1.626 m)    Body mass index is 39.82 kg/m.   General: The patient is well-developed and well-nourished and in no acute distress   Musculoskeletal:    She has right > left subacromialis tender over the right greater than left subacromial bursa.  There also is some tenderness over the trapezius and paraspinal muscles on the right more than left.  She is tender over the trochanteric bursae bilaterally with milder tenderness over the piriformis muscles bilaterally.   Neurologic Exam  Mental status: The patient is alert and oriented x 3 at the time of the examination. The patient has apparent normal recent and remote memory, with an apparently normal attention span and concentration ability.    Cranial nerves:    Facial strength is normal.  Trapezius strength is normal.  No dysarthria is noted.    No obvious hearing deficits are noted.  Motor:  Muscle bulk and tone are normal. Strength is 5/5 in the arms or legs.   Gait and station: Station is normal.  The gait is arthritic.  Tandem gait is mildly wide.  DTRs:   Reflexes are normal and symmetric in the arms and legs.     DIAGNOSTIC DATA  (LABS, IMAGING, TESTING) - I reviewed patient records, labs, notes, testing and imaging myself where available.     ASSESSMENT AND PLAN   1. Subacromial bursitis of both shoulders   2. Trochanteric bursitis of left hip   3. Trochanteric bursitis, right hip   4. Vertigo     1.   Bilateral trochanteric bursae (hips) with 6 mg Betamethasone in [place] using sterile technique.  She tolerated the procedure well and there were no complications. 2.   Inject bilateral subacromial bursa (shoulder) with 3 mg Betamethasone and [place] using sterile technique.  She tolerated the procedure well  there were no complications. 3.   Continue Percocet.  The [place] controlled substance database was reviewed.  3.   Try to stay active and exercises tolerated. 4.   She will return to see me in 4 months, sooner if she  has new or worsening neurologic symptoms.    Najib Colmenares A. Felecia Shelling, MD, PhD 06/03/2351, 61:44 AM Certified in Neurology, Clinical Neurophysiology, Sleep Medicine, Pain Medicine and Neuroimaging  [facility] [address] [place], [place] [address] [phone]

## 2018-12-28 ENCOUNTER — Other Ambulatory Visit: Payer: Self-pay | Admitting: Neurology

## 2018-12-28 DIAGNOSIS — M545 Low back pain, unspecified: Secondary | ICD-10-CM

## 2018-12-28 MED ORDER — OXYCODONE-ACETAMINOPHEN 10-325 MG PO TABS: 1.0000 | ORAL_TABLET | Freq: Four times a day (QID) | ORAL | 0 refills | Status: DC | PRN

## 2018-12-28 NOTE — Addendum Note (Signed)
Addended by: Rossie Muskrat L on: 12/28/2018 11:56 AM   Modules accepted: Orders

## 2018-12-28 NOTE — Telephone Encounter (Addendum)
Checked drug registry. He last refilled 11/29/18 #[id number]. Last seen 12/03/18 and next f/u 04/08/19. Post-dated to fill on or after 12/29/18

## 2018-12-28 NOTE — Telephone Encounter (Signed)
Pt has called for  A refill on oxyCODONE-acetaminophen (PERCOCET) 10-325 MG tablet [place] [id number]

## 2018-12-30 DIAGNOSIS — H6692 Otitis media, unspecified, left ear: Secondary | ICD-10-CM | POA: Diagnosis not present

## 2018-12-30 DIAGNOSIS — Z853 Personal history of malignant neoplasm of breast: Secondary | ICD-10-CM | POA: Diagnosis not present

## 2018-12-30 DIAGNOSIS — C50511 Malignant neoplasm of lower-outer quadrant of right female breast: Secondary | ICD-10-CM | POA: Diagnosis not present

## 2018-12-30 DIAGNOSIS — H6592 Unspecified nonsuppurative otitis media, left ear: Secondary | ICD-10-CM | POA: Diagnosis not present

## 2018-12-30 DIAGNOSIS — Z79810 Long term (current) use of selective estrogen receptor modulators (SERMs): Secondary | ICD-10-CM | POA: Diagnosis not present

## 2018-12-30 DIAGNOSIS — Z170 Estrogen receptor positive status [ER+]: Secondary | ICD-10-CM | POA: Diagnosis not present

## 2018-12-30 DIAGNOSIS — Z5181 Encounter for therapeutic drug level monitoring: Secondary | ICD-10-CM | POA: Diagnosis not present

## 2018-12-30 DIAGNOSIS — Z08 Encounter for follow-up examination after completed treatment for malignant neoplasm: Secondary | ICD-10-CM | POA: Diagnosis not present

## 2018-12-30 DIAGNOSIS — Z923 Personal history of irradiation: Secondary | ICD-10-CM | POA: Diagnosis not present

## 2019-01-26 ENCOUNTER — Other Ambulatory Visit: Payer: Self-pay

## 2019-01-26 ENCOUNTER — Other Ambulatory Visit: Payer: Self-pay | Admitting: Neurology

## 2019-01-26 DIAGNOSIS — M545 Low back pain, unspecified: Secondary | ICD-10-CM

## 2019-01-26 MED ORDER — OXYCODONE-ACETAMINOPHEN 10-325 MG PO TABS: 1.0000 | ORAL_TABLET | Freq: Four times a day (QID) | ORAL | 0 refills | Status: DC | PRN

## 2019-01-26 NOTE — Telephone Encounter (Signed)
Pt is requesting a refill of oxyCODONE-acetaminophen (PERCOCET) 10-325 MG tablet , to be sent to [id number] - [place], [place] - [address]

## 2019-01-27 ENCOUNTER — Ambulatory Visit (INDEPENDENT_AMBULATORY_CARE_PROVIDER_SITE_OTHER): Payer: Medicare HMO | Admitting: Family Medicine

## 2019-01-27 ENCOUNTER — Other Ambulatory Visit: Payer: Self-pay

## 2019-01-27 ENCOUNTER — Encounter: Payer: Self-pay | Admitting: Family Medicine

## 2019-01-27 VITALS — BP 120/86 | HR 90 | Temp 96.9°F | Ht 65.0 in | Wt 234.1 lb

## 2019-01-27 DIAGNOSIS — R195 Other fecal abnormalities: Secondary | ICD-10-CM | POA: Diagnosis not present

## 2019-01-27 DIAGNOSIS — H6982 Other specified disorders of Eustachian tube, left ear: Secondary | ICD-10-CM

## 2019-01-27 DIAGNOSIS — H9202 Otalgia, left ear: Secondary | ICD-10-CM | POA: Diagnosis not present

## 2019-01-27 MED ORDER — PREDNISONE 20 MG PO TABS: 40.0000 mg | ORAL_TABLET | Freq: Every day | ORAL | 0 refills | Status: AC

## 2019-01-27 NOTE — Patient Instructions (Addendum)
OK to use Debrox (peroxide) in the ear to loosen up wax. Also recommend using a bulb syringe (for removing boogers from baby's noses) to flush through warm water. Do not use Q-tips as this can impact wax further.  Continue the Xyzal. Consider a steroid nasal spray like Flonase or Nasonex.   If you do not hear anything about your referral in the next 1-2 weeks, call our office and ask for an update.  Let us know if you need anything.

## 2019-01-27 NOTE — Progress Notes (Signed)
Chief Complaint  Patient presents with  . Ear Pain  . Dizziness    Pt is here for left ear pain. Duration: 3 weeks Progression: unchanged Associated symptoms: congestion and sneezing Denies: sore throat, bleeding, or discharge from ear Treatment to date: Zpak  Cologard +. Does have lower abd pain.  Needs to see a gastroenterologist.  ROS:  HEENT: +ear pain Costitutional: Denies fevers  Past Medical History:  Diagnosis Date  . Anxiety   . Cancer (HCC)    breast  . Chronic back pain    takes oxycodone  . Complication of anesthesia   . Eczema   . Environmental allergies   . Headache   . Hypertension   . PONV (postoperative nausea and vomiting)   . Vertigo    Family History  Problem Relation Age of Onset  . Hypertension Mother    Past Surgical History:  Procedure Laterality Date  . BREAST REDUCTION SURGERY Left 11/20/2017   Procedure: left breast reduction for symmetry and liposuction bilateral lateral breasts;  Surgeon: Wallace Going, DO;  Location: [facility];  Service: Plastics;  Laterality: Left;  . BREAST SURGERY     rt breast lumpectomy  . LIPOSUCTION    . TUBAL LIGATION      BP 120/86 (BP Location: Left Arm, Patient Position: Sitting, Cuff Size: Large)   Pulse 90   Temp (!) 96.9 F (36.1 C) (Temporal)   Ht 5\' 5"  (1.651 m)   Wt 234 lb 2 oz (106.2 kg)   SpO2 98%   BMI 38.96 kg/m  General: Awake, alert, appearing stated age HEENT:  L ear- Canal patent without drainage or erythema, TM is neg R ear- canal patent without drainage or erythema, TM is neg Nose- nares patent and without discharge Mouth- Lips, gums and dentition unremarkable, pharynx is without erythema or exudate Neck: No adenopathy Lungs: Normal effort, no accessory muscle use Psych: Age appropriate judgment and insight, normal mood and affect  Positive colorectal cancer screening using Cologuard test - Plan: Ambulatory referral to Gastroenterology  Left ear pain -  Plan: predniSONE (DELTASONE) 20 MG tablet  Dysfunction of left eustachian tube - Plan: predniSONE (DELTASONE) 20 MG tablet  1- refer to gi 2-there is no improvement after the ear flush.  There is not significant wax in my exam but the patient wanted to be sure.  We will do a 5-day course of prednisone to help open up the eustachian tube which I believe is the cause of her issue.  Continue Xyzal. Follow-up as needed. Pt voiced understanding and agreement to the plan.  [place], DO 01/27/19 12:09 PM

## 2019-02-01 ENCOUNTER — Encounter: Payer: Self-pay | Admitting: Family Medicine

## 2019-02-02 ENCOUNTER — Encounter: Payer: Self-pay | Admitting: Family Medicine

## 2019-02-02 ENCOUNTER — Encounter: Payer: Self-pay | Admitting: *Deleted

## 2019-02-11 DIAGNOSIS — R195 Other fecal abnormalities: Secondary | ICD-10-CM | POA: Diagnosis not present

## 2019-02-15 ENCOUNTER — Telehealth: Payer: Self-pay | Admitting: Family Medicine

## 2019-02-15 NOTE — Telephone Encounter (Signed)
Called to inform of a Positive fecal test.  The patient was aware already. She stated she has scheduled on her own a Colonoscopy on March 02, 2019 at 7:15 am in the morning at [facility].  She was not able to scheduled with [facility] and took care of this herself as she felt such a need to get this procedure done. She did want PCP to know she is walking better.

## 2019-02-25 ENCOUNTER — Telehealth: Payer: Self-pay | Admitting: Neurology

## 2019-02-25 DIAGNOSIS — M545 Low back pain, unspecified: Secondary | ICD-10-CM

## 2019-02-25 MED ORDER — OXYCODONE-ACETAMINOPHEN 10-325 MG PO TABS: 1.0000 | ORAL_TABLET | Freq: Four times a day (QID) | ORAL | 0 refills | Status: DC | PRN

## 2019-02-25 NOTE — Telephone Encounter (Signed)
[place] PMD aware website check. Pt last refill medication 01/28/2019.

## 2019-02-25 NOTE — Addendum Note (Signed)
Addended by: Arlice Colt A on: 02/25/2019 01:35 PM   Modules accepted: Orders

## 2019-02-25 NOTE — Telephone Encounter (Signed)
Pt is needing a refill on her oxyCODONE-acetaminophen (PERCOCET) 10-325 MG tablet sent in to the [facility] on [address]

## 2019-03-02 DIAGNOSIS — R195 Other fecal abnormalities: Secondary | ICD-10-CM | POA: Diagnosis not present

## 2019-03-02 DIAGNOSIS — K5730 Diverticulosis of large intestine without perforation or abscess without bleeding: Secondary | ICD-10-CM | POA: Diagnosis not present

## 2019-03-02 DIAGNOSIS — K635 Polyp of colon: Secondary | ICD-10-CM | POA: Diagnosis not present

## 2019-03-02 DIAGNOSIS — D125 Benign neoplasm of sigmoid colon: Secondary | ICD-10-CM | POA: Diagnosis not present

## 2019-03-02 DIAGNOSIS — Z86010 Personal history of colonic polyps: Secondary | ICD-10-CM | POA: Diagnosis not present

## 2019-03-02 DIAGNOSIS — K648 Other hemorrhoids: Secondary | ICD-10-CM | POA: Diagnosis not present

## 2019-03-15 ENCOUNTER — Telehealth: Payer: Self-pay | Admitting: Family Medicine

## 2019-03-15 ENCOUNTER — Other Ambulatory Visit: Payer: Self-pay

## 2019-03-15 NOTE — Telephone Encounter (Signed)
Patient declined at this time. SF

## 2019-03-16 ENCOUNTER — Other Ambulatory Visit: Payer: Self-pay

## 2019-03-16 ENCOUNTER — Encounter: Payer: Self-pay | Admitting: Family Medicine

## 2019-03-16 ENCOUNTER — Ambulatory Visit (INDEPENDENT_AMBULATORY_CARE_PROVIDER_SITE_OTHER): Payer: Medicare HMO | Admitting: Family Medicine

## 2019-03-16 VITALS — BP 124/86 | HR 106 | Temp 97.0°F | Ht 65.0 in | Wt 238.0 lb

## 2019-03-16 DIAGNOSIS — E559 Vitamin D deficiency, unspecified: Secondary | ICD-10-CM | POA: Diagnosis not present

## 2019-03-16 DIAGNOSIS — L918 Other hypertrophic disorders of the skin: Secondary | ICD-10-CM | POA: Diagnosis not present

## 2019-03-16 DIAGNOSIS — E782 Mixed hyperlipidemia: Secondary | ICD-10-CM

## 2019-03-16 DIAGNOSIS — H6983 Other specified disorders of Eustachian tube, bilateral: Secondary | ICD-10-CM

## 2019-03-16 DIAGNOSIS — I10 Essential (primary) hypertension: Secondary | ICD-10-CM

## 2019-03-16 LAB — COMPREHENSIVE METABOLIC PANEL
ALT: 13 U/L (ref 0–35)
AST: 15 U/L (ref 0–37)
Albumin: 4.2 g/dL (ref 3.5–5.2)
Alkaline Phosphatase: 56 U/L (ref 39–117)
BUN: 15 mg/dL (ref 6–23)
CO2: 27 mEq/L (ref 19–32)
Calcium: 9.4 mg/dL (ref 8.4–10.5)
Chloride: 104 mEq/L (ref 96–112)
Creatinine, Ser: 0.86 mg/dL (ref 0.40–1.20)
GFR: 80.21 mL/min (ref >60.00)
Glucose, Bld: 93 mg/dL (ref 70–99)
Potassium: 3.8 mEq/L (ref 3.5–5.1)
Sodium: 141 mEq/L (ref 135–145)
Total Bilirubin: 0.3 mg/dL (ref 0.2–1.2)
Total Protein: 7.4 g/dL (ref 6.0–8.3)

## 2019-03-16 LAB — LIPID PANEL
Cholesterol: 226 mg/dL — ABNORMAL HIGH (ref 0–200)
HDL: 48.6 mg/dL
LDL Cholesterol: 149 mg/dL — ABNORMAL HIGH (ref 0–99)
NonHDL: 177.59
Total CHOL/HDL Ratio: 5
Triglycerides: 142 mg/dL (ref 0.0–149.0)
VLDL: 28.4 mg/dL (ref 0.0–40.0)

## 2019-03-16 LAB — VITAMIN D 25 HYDROXY (VIT D DEFICIENCY, FRACTURES): VITD: 45.87 ng/mL (ref 30.00–100.00)

## 2019-03-16 MED ORDER — PREDNISONE 20 MG PO TABS: 40.0000 mg | ORAL_TABLET | Freq: Every day | ORAL | 0 refills | Status: AC

## 2019-03-16 MED ORDER — TRIAMTERENE-HCTZ 37.5-25 MG PO TABS: 0.5000 | ORAL_TABLET | Freq: Every day | ORAL | 2 refills | Status: DC

## 2019-03-16 NOTE — Patient Instructions (Addendum)
Do not shower for the rest of the day. When you do wash it, use only soap and water. Do not vigorously scrub. Apply triple antibiotic ointment (like Neosporin) twice daily. Keep the area clean and dry.   Things to look out for: increasing pain not relieved by ibuprofen/acetaminophen, fevers, spreading redness, drainage of pus, or foul odor.  Keep the diet clean and stay active.  Consider a steroid nasal spray like Flonase to help prevent this in the future.   Let us know if you need anything.

## 2019-03-16 NOTE — Progress Notes (Signed)
Chief Complaint  Patient presents with  . Follow-up    Subjective Kimberly Mann is a 63 y.o. female who presents for hypertension follow up. She does monitor home blood pressures. Blood pressures ranging from 120's/70-80's on average. She is compliant with medications- Maxide 1/2 tab daily. Patient has these side effects of medication: none She is adhering to a healthy diet overall. Current exercise: walking   Skin tags below R eye for 1 year, started bothering her over past 6 mo. No new topicals. +pain, no itching/drainage. Has not tried anything at home thus far.   Vertigo for several days. +hx of allergies which leads to dizziness. Zpak last time helped. No pain or drainage from ears.   Past Medical History:  Diagnosis Date  . Anxiety   . Cancer (HCC)    breast  . Chronic back pain    takes oxycodone  . Complication of anesthesia   . Eczema   . Environmental allergies   . Headache   . Hypertension   . PONV (postoperative nausea and vomiting)   . Vertigo     Review of Systems Cardiovascular: no chest pain Respiratory:  no shortness of breath  Exam BP 124/86 (BP Location: Left Arm, Patient Position: Sitting, Cuff Size: Large)   Pulse (!) 106   Temp (!) 97 F (36.1 C) (Temporal)   Ht 5' 5"  (1.651 m)   Wt 238 lb (108 kg)   SpO2 98%   BMI 39.61 kg/m  General:  well developed, well nourished, in no apparent distress Ears: Patent, neg TM's b/l Nose: Nares patent w/o dc Mouth: MMM Heart: RRR, no bruits, no LE edema Lungs: clear to auscultation, no accessory muscle use Skin: 2 small skin tags with mild signs of irritation R inferolateral orbit, no fluctuance, scaling or erythema Psych: well oriented with normal range of affect and appropriate judgment/insight  Procedure note; skin tag removal Informed consent obtained.. Skin tags were anesthetized with 1% lidocaine with epinephrine after being cleaned with alcohol. Lesions of interested grasped w tweezers and  cut under at base of stalk w Dermablade.  Hemostasis was obtained. There were no complications noted. The patient tolerated the procedure well.  Inflamed skin tag - Plan: PR DESTRUCTION BENIGN LESIONS UP TO 14  Essential hypertension - Plan: triamterene-hydrochlorothiazide (MAXZIDE-25) 37.5-25 MG tablet, Comp Met (CMET)  Dysfunction of both eustachian tubes - Plan: predniSONE (DELTASONE) 20 MG tablet  Vitamin D insufficiency - Plan: Vitamin D (25 hydroxy)  Mixed hyperlipidemia - Plan: Comp Met (CMET), Lipid Profile  1- she was warned about out of pocket cost which she was OK with. Aftercare instructions verbalized and written down. 2- Cont meds for HTN. Counseled on diet and exercise. 3- Pred burst. INCS rec'd for future prevention.  F/u in 6 mo or prn. The patient voiced understanding and agreement to the plan.  [place], DO 03/16/19  12:37 PM

## 2019-03-17 ENCOUNTER — Encounter: Payer: Self-pay | Admitting: Family Medicine

## 2019-03-19 ENCOUNTER — Other Ambulatory Visit: Payer: Self-pay | Admitting: Family Medicine

## 2019-03-19 ENCOUNTER — Encounter: Payer: Self-pay | Admitting: Family Medicine

## 2019-03-19 MED ORDER — VITAMIN D (ERGOCALCIFEROL) 1.25 MG (50000 UNIT) PO CAPS: 50000.0000 [IU] | ORAL_CAPSULE | ORAL | 3 refills | Status: DC

## 2019-03-22 ENCOUNTER — Emergency Department (HOSPITAL_BASED_OUTPATIENT_CLINIC_OR_DEPARTMENT_OTHER)
Admission: EM | Admit: 2019-03-22 | Discharge: 2019-03-22 | Disposition: A | Discharge status: Home / Self Care | Payer: Medicare HMO | Attending: Emergency Medicine | Admitting: Emergency Medicine

## 2019-03-22 ENCOUNTER — Other Ambulatory Visit: Payer: Self-pay

## 2019-03-22 ENCOUNTER — Encounter (HOSPITAL_BASED_OUTPATIENT_CLINIC_OR_DEPARTMENT_OTHER): Payer: Self-pay | Admitting: *Deleted

## 2019-03-22 DIAGNOSIS — R21 Rash and other nonspecific skin eruption: Secondary | ICD-10-CM | POA: Diagnosis present

## 2019-03-22 DIAGNOSIS — Z853 Personal history of malignant neoplasm of breast: Secondary | ICD-10-CM | POA: Diagnosis not present

## 2019-03-22 DIAGNOSIS — L249 Irritant contact dermatitis, unspecified cause: Secondary | ICD-10-CM | POA: Diagnosis not present

## 2019-03-22 DIAGNOSIS — I10 Essential (primary) hypertension: Secondary | ICD-10-CM | POA: Insufficient documentation

## 2019-03-22 DIAGNOSIS — Z87891 Personal history of nicotine dependence: Secondary | ICD-10-CM | POA: Diagnosis not present

## 2019-03-22 NOTE — ED Provider Notes (Signed)
[facility] EMERGENCY DEPARTMENT Provider Note   CSN: [id number] Arrival date & time: 03/22/19  [id number]     History   Chief Complaint Chief Complaint  Patient presents with  . rash to face ? shingles    HPI Kimberly Mann is a 63 y.o. female.     HPI  This is a 63 year old female with a history of breast cancer, eczema, seasonal allergies, hypertension who presents with facial rash.  Patient reports that she had 2 skin tags removed on her face on Tuesday.  She has been using Dial soap and antibiotic ointment.  She transition to using some Philosophy soap within the last 24 hours.  She has noticed increased irritation, redness, puffiness.  She has also noted some tearing of the right eye.  She is not noted any pain.  She does endorse some itchiness.  Denies any vision changes.  Past Medical History:  Diagnosis Date  . Anxiety   . Cancer (HCC)    breast  . Chronic back pain    takes oxycodone  . Complication of anesthesia   . Eczema   . Environmental allergies   . Headache   . Hypertension   . PONV (postoperative nausea and vomiting)   . Vertigo     Patient Active Problem List   Diagnosis Date Noted  . Chronic rhinitis 03/20/2018  . Adverse food reaction 03/20/2018  . Drug reaction 03/20/2018  . Seasonal allergies 01/22/2018  . Breast cancer ([place]) 12/11/2017  . Vertigo 06/16/2017  . Essential hypertension 06/16/2017  . Trochanteric bursitis, left hip 01/20/2017  . Trochanteric bursitis, right hip 09/18/2016  . Ear pain, bilateral 09/18/2016  . Back pain, lumbosacral 05/20/2016  . Bilateral shoulder pain 09/13/2015  . Trochanteric bursitis of left hip 05/17/2015  . Bilateral sciatica 05/17/2015  . Subacromial bursitis 12/13/2014  . Osteoarthritis of left shoulder 05/17/2014  . Neck pain 05/17/2014  . Lower back pain 05/17/2014  . Migraine 05/17/2014    Past Surgical History:  Procedure Laterality Date  . BREAST REDUCTION SURGERY Left 11/20/2017    Procedure: left breast reduction for symmetry and liposuction bilateral lateral breasts;  Surgeon: Wallace Going, DO;  Location: [facility];  Service: Plastics;  Laterality: Left;  . BREAST SURGERY     rt breast lumpectomy  . LIPOSUCTION    . TUBAL LIGATION       OB History    Gravida  1   Para  1   Term  1   Preterm      AB      Living        SAB      TAB      Ectopic      Multiple      Live Births  1            Home Medications    Prior to Admission medications   Medication Sig Start Date End Date Taking? Authorizing Provider  Azelastine HCl 0.15 % SOLN Place 2 sprays into both nostrils 2 (two) times daily. 11/19/18   Dara Hoyer, FNP  diclofenac sodium (VOLTAREN) 1 % GEL Apply 2 g topically 4 (four) times daily as needed. 04/16/18   Sater, Nanine Means, MD  fluconazole (DIFLUCAN) 150 MG tablet Take 1 tablet (150 mg total) by mouth daily. 12/03/18   Sater, Nanine Means, MD  Fluticasone Propionate Truett Perna) 93 MCG/ACT EXHU Place 2 puffs into both nostrils 2 (two) times daily. 09/16/18   Wendling,  Crosby Oyster, DO  hydrOXYzine (VISTARIL) 25 MG capsule Take 1 capsule (25 mg total) by mouth 3 (three) times daily as needed. 12/03/18   Sater, Nanine Means, MD  levocetirizine (XYZAL) 5 MG tablet Take 1 tablet (5 mg total) by mouth every evening. 09/16/18   Wendling, Crosby Oyster, DO  meloxicam (MOBIC) 15 MG tablet Take 1 tablet (15 mg total) by mouth as needed. 07/29/18   Sater, Nanine Means, MD  Multiple Vitamins-Minerals (MULTIVITAMIN ADULT PO)  03/29/15   [provider]  oxyCODONE-acetaminophen (PERCOCET) 10-325 MG tablet Take 1 tablet by mouth every 6 (six) hours as needed for pain. 02/25/19   Sater, Nanine Means, MD  pantoprazole (PROTONIX) 40 MG tablet Take 1 tablet (40 mg total) by mouth daily. 11/18/18   Shelda Pal, DO  promethazine (PHENERGAN) 25 MG tablet 25 mg as needed.  03/05/15   [provider]  tamoxifen (NOLVADEX) 20 MG  tablet Take 20 mg by mouth daily.    [provider]  triamterene-hydrochlorothiazide (MAXZIDE-25) 37.5-25 MG tablet Take 0.5 tablets by mouth daily. 03/16/19   Shelda Pal, DO  Vitamin D, Ergocalciferol, (DRISDOL) 1.25 MG (50000 UT) CAPS capsule Take 1 capsule (50,000 Units total) by mouth every 7 (seven) days. 03/19/19   Shelda Pal, DO    Family History Family History  Problem Relation Age of Onset  . Hypertension Mother     Social History Social History   Tobacco Use  . Smoking status: Former Smoker    Quit date: 05/17/1974    Years since quitting: 44.8  . Smokeless tobacco: Never Used  Substance Use Topics  . Alcohol use: Yes    Alcohol/week: 7.0 standard drinks    Types: 7 Standard drinks or equivalent per week    Comment: one glass of wine with dinner/fim  . Drug use: No    Types: Marijuana     Allergies   Morphine and related, Penicillins, and Sulfa antibiotics   Review of Systems Review of Systems  Constitutional: Negative for fever.  Eyes: Positive for discharge. Negative for redness, itching and visual disturbance.  Skin: Positive for color change and wound.  All other systems reviewed and are negative.    Physical Exam Updated Vital Signs BP (!) 119/94   Pulse 94   Resp 19   Ht 1.651 m (5\' 5" )   Wt 106.1 kg   SpO2 99%   BMI 38.94 kg/m   Physical Exam Vitals signs and nursing note reviewed.  Constitutional:      Appearance: She is well-developed. She is obese.  HENT:     Head: Normocephalic and atraumatic.     Comments: Slight erythema and swelling noted just inferior and lateral to the right eye with hypertrophy noted, no vesicles noted, no tenderness to palpation, no additional lesions over the forehead, nose, ear    Nose: Nose normal.  Eyes:     Extraocular Movements: Extraocular movements intact.     Conjunctiva/sclera: Conjunctivae normal.     Pupils: Pupils are equal, round, and reactive to light.      Comments: Tearing from the right eye  Neck:     Musculoskeletal: Neck supple.  Cardiovascular:     Rate and Rhythm: Normal rate and regular rhythm.  Pulmonary:     Effort: Pulmonary effort is normal. No respiratory distress.  Musculoskeletal:     Right lower leg: No edema.     Left lower leg: No edema.  Skin:    General: Skin  is warm and dry.     Comments: See HEENT above  Neurological:     Mental Status: She is alert and oriented to person, place, and time.  Psychiatric:        Mood and Affect: Mood normal.      ED Treatments / Results  Labs (all labs ordered are listed, but only abnormal results are displayed) Labs Reviewed - No data to display  EKG None  Radiology No results found.  Procedures Procedures (including critical care time)  Medications Ordered in ED Medications - No data to display   Initial Impression / Assessment and Plan / ED Course  I have reviewed the triage vital signs and the nursing notes.  Pertinent labs & imaging results that were available during my care of the patient were reviewed by me and considered in my medical decision making (see chart for details).        Patient presents with redness, swelling, itching at site of previous removal of skin tags.  She is overall nontoxic-appearing and vital signs are reassuring.  She has an extensive history of eczema and states "I react a lot."  Her exam is most consistent with contact dermatitis.  While it is one-sided, less suspicious for shingles.  Could be related to using a new soap.  Recommend going back to her Dial soap and avoiding any further irritants.  Recommend that she continue her antibiotic ointment.  However, recommend that it does not have neomycin in it as sometimes people can react to this.  Patient was advised that if she develops further lesions, lesions that are not related to her removal sites, she needs to be reevaluated for shingles  After history, exam, and medical workup I  feel the patient has been appropriately medically screened and is safe for discharge home. Pertinent diagnoses were discussed with the patient. Patient was given return precautions.   Final Clinical Impressions(s) / ED Diagnoses   Final diagnoses:  Irritant contact dermatitis, unspecified trigger    ED Discharge Orders    None       Faaris Arizpe, Barbette Hair, MD 03/22/19 [phone]

## 2019-03-22 NOTE — Discharge Instructions (Addendum)
You were seen today for swelling and redness of the face.  This may be related to contact dermatitis from soap.  Make sure to only use Dial soap.  Avoid any irritants.  Use bacitracin antibiotic ointment.  Follow-up closely with your primary doctor.  If you note blistering or rash that does not involve the areas from your skin tag, you need to be reevaluated for possible shingles.

## 2019-03-22 NOTE — ED Triage Notes (Signed)
Pt states she had a skin tag removed on Tuesday from the right side of her face. States she noticed a rash to the right side of her face on Sunday. C/o itching. On exam right side of face with clustering of bumps. Appears like a shingles rash.

## 2019-03-28 ENCOUNTER — Other Ambulatory Visit: Payer: Self-pay

## 2019-03-28 ENCOUNTER — Encounter (HOSPITAL_BASED_OUTPATIENT_CLINIC_OR_DEPARTMENT_OTHER): Payer: Self-pay | Admitting: Emergency Medicine

## 2019-03-28 ENCOUNTER — Emergency Department (HOSPITAL_BASED_OUTPATIENT_CLINIC_OR_DEPARTMENT_OTHER)
Admission: EM | Admit: 2019-03-28 | Discharge: 2019-03-28 | Disposition: A | Discharge status: Home / Self Care | Payer: Medicare HMO | Attending: Emergency Medicine | Admitting: Emergency Medicine

## 2019-03-28 DIAGNOSIS — R21 Rash and other nonspecific skin eruption: Secondary | ICD-10-CM | POA: Diagnosis not present

## 2019-03-28 DIAGNOSIS — Z853 Personal history of malignant neoplasm of breast: Secondary | ICD-10-CM | POA: Diagnosis not present

## 2019-03-28 DIAGNOSIS — Z880 Allergy status to penicillin: Secondary | ICD-10-CM | POA: Diagnosis not present

## 2019-03-28 DIAGNOSIS — Z885 Allergy status to narcotic agent status: Secondary | ICD-10-CM | POA: Diagnosis not present

## 2019-03-28 DIAGNOSIS — Z882 Allergy status to sulfonamides status: Secondary | ICD-10-CM | POA: Insufficient documentation

## 2019-03-28 DIAGNOSIS — Z87891 Personal history of nicotine dependence: Secondary | ICD-10-CM | POA: Diagnosis not present

## 2019-03-28 DIAGNOSIS — Z79899 Other long term (current) drug therapy: Secondary | ICD-10-CM | POA: Diagnosis not present

## 2019-03-28 DIAGNOSIS — I10 Essential (primary) hypertension: Secondary | ICD-10-CM | POA: Diagnosis not present

## 2019-03-28 DIAGNOSIS — R220 Localized swelling, mass and lump, head: Secondary | ICD-10-CM | POA: Diagnosis present

## 2019-03-28 MED ORDER — DOXYCYCLINE HYCLATE 100 MG PO CAPS: 100.0000 mg | ORAL_CAPSULE | Freq: Two times a day (BID) | ORAL | 0 refills | Status: DC

## 2019-03-28 MED ORDER — FLUORESCEIN SODIUM 1 MG OP STRP
1.0000 | ORAL_STRIP | Freq: Once | OPHTHALMIC | Status: AC
  Administered 2019-03-28: 1 via OPHTHALMIC
  Filled 2019-03-28: qty 1

## 2019-03-28 MED ORDER — PREDNISONE 10 MG (21) PO TBPK: ORAL_TABLET | Freq: Every day | ORAL | 0 refills | Status: DC

## 2019-03-28 MED ORDER — TETRACAINE HCL 0.5 % OP SOLN
2.0000 [drp] | Freq: Once | OPHTHALMIC | Status: AC
  Administered 2019-03-28: 07:00:00 2 [drp] via OPHTHALMIC
  Filled 2019-03-28: qty 4

## 2019-03-28 MED ORDER — DOXYCYCLINE HYCLATE 100 MG PO TABS
100.0000 mg | ORAL_TABLET | Freq: Once | ORAL | Status: AC
  Administered 2019-03-28: 100 mg via ORAL
  Filled 2019-03-28: qty 1

## 2019-03-28 MED ORDER — PREDNISONE 50 MG PO TABS
60.0000 mg | ORAL_TABLET | Freq: Once | ORAL | Status: AC
  Administered 2019-03-28: 60 mg via ORAL
  Filled 2019-03-28: qty 1

## 2019-03-28 NOTE — ED Provider Notes (Signed)
[facility] EMERGENCY DEPARTMENT Provider Note   CSN: [id number] Arrival date & time: 03/28/19  0703     History   Chief Complaint Chief Complaint  Patient presents with  . Facial Swelling    HPI Kimberly Mann is a 63 y.o. female.  She is here for worsening facial rash and swelling.  She said she had 2 skin tags removed from around her right eyelid and right cheek 12 days ago.  She was using some topical antibiotic.  She also had used a new facial soap.  She was seen here 6 days ago for a rash to this area.  Recommendations at that time work to not use that soap anymore and change the topical antibiotic to bacitracin.  She says her eye lids are more swollen and very itchy and she has been rubbing at them.  No fevers or chills no cough.  Just using Vaseline to the affected area now.     The history is provided by the patient.  Rash Location:  Face Facial rash location:  R cheek and R eyelid Quality: itchiness and swelling   Severity:  Unable to specify Onset quality:  Gradual Timing:  Constant Progression:  Worsening Chronicity:  New Context: medications and new detergent/soap   Relieved by:  Nothing Worsened by:  Nothing Ineffective treatments:  Antibiotic cream Associated symptoms: no abdominal pain, no fever, no headaches, no nausea and not vomiting     Past Medical History:  Diagnosis Date  . Anxiety   . Cancer (HCC)    breast  . Chronic back pain    takes oxycodone  . Complication of anesthesia   . Eczema   . Environmental allergies   . Headache   . Hypertension   . PONV (postoperative nausea and vomiting)   . Vertigo     Patient Active Problem List   Diagnosis Date Noted  . Chronic rhinitis 03/20/2018  . Adverse food reaction 03/20/2018  . Drug reaction 03/20/2018  . Seasonal allergies 01/22/2018  . Breast cancer ([place]) 12/11/2017  . Vertigo 06/16/2017  . Essential hypertension 06/16/2017  . Trochanteric bursitis, left hip 01/20/2017   . Trochanteric bursitis, right hip 09/18/2016  . Ear pain, bilateral 09/18/2016  . Back pain, lumbosacral 05/20/2016  . Bilateral shoulder pain 09/13/2015  . Trochanteric bursitis of left hip 05/17/2015  . Bilateral sciatica 05/17/2015  . Subacromial bursitis 12/13/2014  . Osteoarthritis of left shoulder 05/17/2014  . Neck pain 05/17/2014  . Lower back pain 05/17/2014  . Migraine 05/17/2014    Past Surgical History:  Procedure Laterality Date  . BREAST REDUCTION SURGERY Left 11/20/2017   Procedure: left breast reduction for symmetry and liposuction bilateral lateral breasts;  Surgeon: Wallace Going, DO;  Location: [facility];  Service: Plastics;  Laterality: Left;  . BREAST SURGERY     rt breast lumpectomy  . LIPOSUCTION    . TUBAL LIGATION       OB History    Gravida  1   Para  1   Term  1   Preterm      AB      Living        SAB      TAB      Ectopic      Multiple      Live Births  1            Home Medications    Prior to Admission medications   Medication Sig Start  Date End Date Taking? Authorizing Provider  Azelastine HCl 0.15 % SOLN Place 2 sprays into both nostrils 2 (two) times daily. 11/19/18   Dara Hoyer, FNP  diclofenac sodium (VOLTAREN) 1 % GEL Apply 2 g topically 4 (four) times daily as needed. 04/16/18   Sater, Nanine Means, MD  fluconazole (DIFLUCAN) 150 MG tablet Take 1 tablet (150 mg total) by mouth daily. 12/03/18   Sater, Nanine Means, MD  Fluticasone Propionate Truett Perna) 93 MCG/ACT EXHU Place 2 puffs into both nostrils 2 (two) times daily. 09/16/18   Shelda Pal, DO  hydrOXYzine (VISTARIL) 25 MG capsule Take 1 capsule (25 mg total) by mouth 3 (three) times daily as needed. 12/03/18   Sater, Nanine Means, MD  levocetirizine (XYZAL) 5 MG tablet Take 1 tablet (5 mg total) by mouth every evening. 09/16/18   Wendling, Crosby Oyster, DO  meloxicam (MOBIC) 15 MG tablet Take 1 tablet (15 mg total) by mouth as needed. 07/29/18    Sater, Nanine Means, MD  Multiple Vitamins-Minerals (MULTIVITAMIN ADULT PO)  03/29/15   [provider]  oxyCODONE-acetaminophen (PERCOCET) 10-325 MG tablet Take 1 tablet by mouth every 6 (six) hours as needed for pain. 02/25/19   Sater, Nanine Means, MD  pantoprazole (PROTONIX) 40 MG tablet Take 1 tablet (40 mg total) by mouth daily. 11/18/18   Shelda Pal, DO  promethazine (PHENERGAN) 25 MG tablet 25 mg as needed.  03/05/15   [provider]  tamoxifen (NOLVADEX) 20 MG tablet Take 20 mg by mouth daily.    [provider]  triamterene-hydrochlorothiazide (MAXZIDE-25) 37.5-25 MG tablet Take 0.5 tablets by mouth daily. 03/16/19   Shelda Pal, DO  Vitamin D, Ergocalciferol, (DRISDOL) 1.25 MG (50000 UT) CAPS capsule Take 1 capsule (50,000 Units total) by mouth every 7 (seven) days. 03/19/19   Shelda Pal, DO    Family History Family History  Problem Relation Age of Onset  . Hypertension Mother     Social History Social History   Tobacco Use  . Smoking status: Former Smoker    Quit date: 05/17/1974    Years since quitting: 44.8  . Smokeless tobacco: Never Used  Substance Use Topics  . Alcohol use: Yes    Alcohol/week: 7.0 standard drinks    Types: 7 Standard drinks or equivalent per week    Comment: one glass of wine with dinner/fim  . Drug use: No    Types: Marijuana     Allergies   Morphine and related, Penicillins, and Sulfa antibiotics   Review of Systems Review of Systems  Constitutional: Negative for fever.  HENT: Negative for trouble swallowing.   Eyes: Positive for discharge and itching. Negative for pain and visual disturbance.  Gastrointestinal: Negative for abdominal pain, nausea and vomiting.  Skin: Positive for rash.  Neurological: Negative for headaches.     Physical Exam Updated Vital Signs BP (!) 151/95   Pulse 83   Temp 97.7 F (36.5 C)   Resp 19   SpO2 100%   Physical Exam Constitutional:       Appearance: She is well-developed.  HENT:     Head: Normocephalic and atraumatic.     Comments: She has moderate edema of her upper and lower eyelid and some edema through the cheek on the right.  There is some erythema there.  No warmth.  Her skin tag sites do not look obviously infected.  This rash does not cross the midline but does not seem zoster-like. Eyes:  Extraocular Movements: Extraocular movements intact.     Conjunctiva/sclera: Conjunctivae normal.     Pupils: Pupils are equal, round, and reactive to light.  Neck:     Musculoskeletal: Neck supple.  Skin:    General: Skin is warm and dry.     Capillary Refill: Capillary refill takes less than 2 seconds.     Findings: Erythema and rash present.  Neurological:     General: No focal deficit present.     Mental Status: She is alert.     GCS: GCS eye subscore is 4. GCS verbal subscore is 5. GCS motor subscore is 6.        ED Treatments / Results  Labs (all labs ordered are listed, but only abnormal results are displayed) Labs Reviewed - No data to display  EKG None  Radiology No results found.  Procedures Procedures (including critical care time)  Medications Ordered in ED Medications  fluorescein ophthalmic strip 1 strip (has no administration in time range)  tetracaine (PONTOCAINE) 0.5 % ophthalmic solution 2 drop (has no administration in time range)     Initial Impression / Assessment and Plan / ED Course  I have reviewed the triage vital signs and the nursing notes.  Pertinent labs & imaging results that were available during my care of the patient were reviewed by me and considered in my medical decision making (see chart for details).  Clinical Course as of Mar 27 812  Sun Mar 28, 2019  [id number] Differential diagnosis is contact dermatitis, shingles, cellulitis.  Afebrile here.  No other systemic symptoms.  Fluorescein eye exam with no uptake, no dendrites.  Will cover for possible cellulitis with  antibiotics and put her on steroid taper for likely more contact dermatitis.   [MB]    Clinical Course User Index [MB] Hayden Rasmussen, MD         Final Clinical Impressions(s) / ED Diagnoses   Final diagnoses:  Facial rash    ED Discharge Orders         Ordered    doxycycline (VIBRAMYCIN) 100 MG capsule  2 times daily     03/28/19 0734    predniSONE (STERAPRED UNI-PAK 21 TAB) 10 MG (21) TBPK tablet  Daily     03/28/19 0734           Hayden Rasmussen, MD 03/28/19 [phone]

## 2019-03-28 NOTE — Discharge Instructions (Addendum)
You were seen in the emergency department for worsening of your facial rash.  This is likely still an allergic reaction to either the topical antibiotics or soap.  Please continue to use a cool compress and very mild soap.  We are putting you on a course of antibiotics and a steroid taper.  If you experience any eye problems or high fevers or worsening symptoms please return the emergency department for reevaluation.

## 2019-03-28 NOTE — ED Triage Notes (Signed)
Pt had skin tags removed from face around right eye on the 11/17 and has had eye sweling and irritation since then and got worse this morning.

## 2019-03-29 ENCOUNTER — Other Ambulatory Visit: Payer: Self-pay | Admitting: Neurology

## 2019-03-29 DIAGNOSIS — M545 Low back pain, unspecified: Secondary | ICD-10-CM

## 2019-03-29 MED ORDER — OXYCODONE-ACETAMINOPHEN 10-325 MG PO TABS: 1.0000 | ORAL_TABLET | Freq: Four times a day (QID) | ORAL | 0 refills | Status: DC | PRN

## 2019-03-29 NOTE — Addendum Note (Signed)
Addended by: Hope Pigeon on: 03/29/2019 08:37 AM   Modules accepted: Orders

## 2019-03-29 NOTE — Telephone Encounter (Signed)
Pt has called for a refill on her  oxyCODONE-acetaminophen (PERCOCET) 10-325 MG tablet [place] [id number]

## 2019-04-08 ENCOUNTER — Encounter: Payer: Self-pay | Admitting: Neurology

## 2019-04-08 ENCOUNTER — Ambulatory Visit (INDEPENDENT_AMBULATORY_CARE_PROVIDER_SITE_OTHER): Payer: Medicare HMO | Admitting: Neurology

## 2019-04-08 ENCOUNTER — Other Ambulatory Visit: Payer: Self-pay

## 2019-04-08 VITALS — BP 122/85 | HR 90 | Temp 97.2°F | Ht 65.0 in | Wt 241.5 lb

## 2019-04-08 DIAGNOSIS — M545 Low back pain, unspecified: Secondary | ICD-10-CM

## 2019-04-08 DIAGNOSIS — R42 Dizziness and giddiness: Secondary | ICD-10-CM | POA: Diagnosis not present

## 2019-04-08 DIAGNOSIS — M7062 Trochanteric bursitis, left hip: Secondary | ICD-10-CM | POA: Diagnosis not present

## 2019-04-08 DIAGNOSIS — M7061 Trochanteric bursitis, right hip: Secondary | ICD-10-CM | POA: Diagnosis not present

## 2019-04-08 DIAGNOSIS — M7552 Bursitis of left shoulder: Secondary | ICD-10-CM

## 2019-04-08 DIAGNOSIS — M5431 Sciatica, right side: Secondary | ICD-10-CM

## 2019-04-08 DIAGNOSIS — M5432 Sciatica, left side: Secondary | ICD-10-CM | POA: Diagnosis not present

## 2019-04-08 DIAGNOSIS — M7551 Bursitis of right shoulder: Secondary | ICD-10-CM | POA: Diagnosis not present

## 2019-04-08 MED ORDER — HYDROXYZINE PAMOATE 25 MG PO CAPS: 25.0000 mg | ORAL_CAPSULE | Freq: Three times a day (TID) | ORAL | 1 refills | Status: DC | PRN

## 2019-04-08 MED ORDER — OXYCODONE-ACETAMINOPHEN 10-325 MG PO TABS: 1.0000 | ORAL_TABLET | Freq: Four times a day (QID) | ORAL | 0 refills | Status: DC | PRN

## 2019-04-08 MED ORDER — FLUCONAZOLE 150 MG PO TABS: 150.0000 mg | ORAL_TABLET | Freq: Every day | ORAL | 0 refills | Status: DC

## 2019-04-08 NOTE — Progress Notes (Signed)
t  [facility]  PATIENT: Kimberly Mann DOB: 03-02-1956  REFERRING CLINICIAN: [name]  HISTORY FROM: Patient  REASON FOR VISIT: Left shoulder and neck pain; back pain [  HISTORICAL  CHIEF COMPLAINT:  Chief Complaint  Patient presents with  . Follow-up    RM 13, alone. Last seen 12/03/2018. Taking oxycodone for pain.     HISTORY OF PRESENT ILLNESS:  Kimberly Mann is a 63 y.o. woman with neck, back, hip and shoulder pain.  Update 04/08/2019: She reports having an allergic reactions to an antibiotic ointment she used after a skin tag was removed from her face.  She is reporting more pain in her hips.  Oxycodone helps some.    Injections have helped.    Pain is bilateral in the hips and shoulders as before....she was better for about 3 months after the last series of injections and having more pain the past month.    She also has piriformis and lumbar paraspinal pain.    All pain is worse when she uses the limbs more.    She did get some steroids with the recent antibiotic reactio  Her vertigo is about the same.    There is some positional element though she experiences some vertigo even at rest.    She has seen ENT and has done vestibular therapy.  Update 12/02/2018: She reports the worse of her pain is in the shoulders and hips.   Shoulder pain is worse with movements.  In the past, subacromial injections help the shoulder pain and trochanteric bursa injections have helped the hip pan.  She also has more vertigo.   She feels a spinning sensation when she looks up or changes position.   She has trouble walking when this is happening.  She has fallen out of her bed.   She has had some symptoms x 15 years, but this is worse recently.   She did vestibular therapy in the past.   Sometimes she has left ear pain.   She has seen ENT and was told she has peripheral vertigo.     Update 07/29/2018: She reports that she had some benefit after the trochanteric and  subacromial bursa injection from the December 2019 visit.  However, about a month ago over the benefit was worn off.  She currently has pain in both hips and also pain in the right shoulder.  The pain in the right shoulder worsens when she raises her arm over her head.  She does not note weakness.  The pain in the hips is worse with walking.  She gets some benefit from Percocet.  Meloxicam has helped better than ibuprofen and she would like to get back on that.  She also uses the diclofenac gel over the areas that hurt the most.  Neck pain is doing about the same.  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  The neck pain is worse with movements.    Update 04/16/2018: She is reporting pain in the hips, over the trochanteric bursa.   Pain increases if she walks more.   In the past trochanteric bursae injections have helped a while.   She also notes new pain in her feet, especially the 4th and 5th digits.     She also has some pain in the lower back but this is not as bad as the hip pain.  She also is reporting pain in her right shoulder.   Pain increases with rotation some.   She also  has neck pain and known C3-C6 DJD   She has breast cancer,  She is done with RadRx and still is on tamoxifen.    Her chest pain is better.     Update 12/11/2017: She had recent reconstructive surgery after breast cancer and is more sore in general (Dr. Doreen Salvage;  Also sees Dr. [facility]).   She is on tamoxifen and did RadRx.      She has pain everywhere.   She has pain in both hips and that is her worse pain.   Trochanteric bursa injections have helped in the past.   Due to her recent surgery, she would prefer to hold off on any injections today.     She also notes neck pain, worse with movements.   She has multilevel DJD C3-C6.       Update 08/05/2017: She is reporting more hip pain, bilaterally.    Pain increases if she lays on that side.  Trochanteric injections ususally help for a few months.    She also has  back pain.  She has milder neck pain, worse with tirning he head.   Shoulders are only mildly tender  Update 06/04/2017: Neck, back and shoulder pain improved after the trigger point injections in October but have returned over the last month.      MRI cervical spine showed DJD at C3C4-C5C6.    Her worse pain is in the left shoulder.   Pain increases if she lays on that side.    She also has right shoulder region pain.   She also has neck pain that increases as the day goes on.   Her LB is hurting but not as bad as the neck and shoulders.   Hipas are hurting a lot over the bursae.    Bursa injections have helped in the past.    She sees Dr. Harlow Asa for breast cancer.   She had surgery and radiation and will be starting a pill x 5 days  Update 01/30/2017:    Since the last visit, her neck pain has greatly increased. She is undergoing radiation for breast cancer and she notes that laying down on the table is painful for her.   The pain is in the left neck more than the right. Additionally she is noticing more lower back pain also little worse on the left than the right. She denies any change in strength or sensation in the arms or legs. There is no change in her bladder.    Her hip and shoulder pain improved quite a bit after the joint injections at her last visit. ________________________________ From 01/30/2017 LBP/hip pain:   She reports bilateral lower back pain and pain that is in the buttock and hip region, generally worse on the right.   Pain does not radiate into her legs. Pain is worse with walking and with standing up from a seated position.  In the past, trochanteric bursa injection has helped.   Neck/Shoulder pain:   She reports pain in the neck and shoulders, worse on her right in the neck but in the left > right shulder.    Pain is worse with arms elevated,    Her pain also increases with external rotation and elevation.  Subacromial bursa injection often helped in the past.   However, she has  had some itching following the steroid shots and it was especially bad after the last one 3-4 months ago.    ROM is mildly reduced in shoulders.  No arm weakness.   Breast cancer:   She needed surgery x 2 and is currently on RadRx for breast cancer.       Med's:  For her pain she is on Percocet 4 times a day with benefit.  Sheis also on meloxicam and tizanidine.   She has not shown any drug-seeking behavior       Migraine headaches occur about once a week. If Percocet does not help the migraine she will take a meloxicam.    REVIEW OF SYSTEMS:  Constitutional: No fevers, chills, sweats, or change in appetite Eyes: No visual changes, double vision, eye pain Ear, nose and throat: No hearing loss, ear pain, nasal congestion, sore throat Cardiovascular: No chest pain, palpitations Respiratory:  No shortness of breath at rest or with exertion.   No wheezes GastrointestinaI: No nausea, vomiting, diarrhea, abdominal pain, fecal incontinence Genitourinary:  No dysuria, urinary retention or frequency.  No nocturia. Musculoskeletal: as above Integumentary: No rash, pruritus, skin lesions Neurological: as above Psychiatric: She has som depression since breast cancer dx.  Endocrine: No palpitations, diaphoresis, change in appetite, change in weigh or increased thirst  ALLERGIES: Allergies  Allergen Reactions  . Morphine And Related Nausea And Vomiting and Swelling  . Penicillins   . Sulfa Antibiotics     HOME MEDICATIONS: Outpatient Medications Prior to Visit  Medication Sig Dispense Refill  . Azelastine HCl 0.15 % SOLN Place 2 sprays into both nostrils 2 (two) times daily. 30 mL 5  . diclofenac sodium (VOLTAREN) 1 % GEL Apply 2 g topically 4 (four) times daily as needed. 200 g 5  . doxycycline (VIBRAMYCIN) 100 MG capsule Take 1 capsule (100 mg total) by mouth 2 (two) times daily. 20 capsule 0  . Fluticasone Propionate (XHANCE) 93 MCG/ACT EXHU Place 2 puffs into both nostrils 2 (two) times  daily. 32 mL 12  . levocetirizine (XYZAL) 5 MG tablet Take 1 tablet (5 mg total) by mouth every evening. 90 tablet 2  . meloxicam (MOBIC) 15 MG tablet Take 1 tablet (15 mg total) by mouth as needed. 90 tablet 1  . Multiple Vitamins-Minerals (MULTIVITAMIN ADULT PO)     . pantoprazole (PROTONIX) 40 MG tablet Take 1 tablet (40 mg total) by mouth daily. 30 tablet 3  . predniSONE (STERAPRED UNI-PAK 21 TAB) 10 MG (21) TBPK tablet Take by mouth daily. Take 6 tabs by mouth daily  for 2 days, then 5 tabs for 2 days, then 4 tabs for 2 days, then 3 tabs for 2 days, 2 tabs for 2 days, then 1 tab by mouth daily for 2 days 42 tablet 0  . promethazine (PHENERGAN) 25 MG tablet 25 mg as needed.     . tamoxifen (NOLVADEX) 20 MG tablet Take 20 mg by mouth daily.    Marland Kitchen triamterene-hydrochlorothiazide (MAXZIDE-25) 37.5-25 MG tablet Take 0.5 tablets by mouth daily. 90 tablet 2  . Vitamin D, Ergocalciferol, (DRISDOL) 1.25 MG (50000 UT) CAPS capsule Take 1 capsule (50,000 Units total) by mouth every 7 (seven) days. 4 capsule 3  . fluconazole (DIFLUCAN) 150 MG tablet Take 1 tablet (150 mg total) by mouth daily. 15 tablet 0  . hydrOXYzine (VISTARIL) 25 MG capsule Take 1 capsule (25 mg total) by mouth 3 (three) times daily as needed. 60 capsule 1  . oxyCODONE-acetaminophen (PERCOCET) 10-325 MG tablet Take 1 tablet by mouth every 6 (six) hours as needed for pain. 120 tablet 0   No facility-administered medications prior to visit.  PAST MEDICAL HISTORY: Past Medical History:  Diagnosis Date  . Anxiety   . Cancer (HCC)    breast  . Chronic back pain    takes oxycodone  . Complication of anesthesia   . Eczema   . Environmental allergies   . Headache   . Hypertension   . PONV (postoperative nausea and vomiting)   . Vertigo     PAST SURGICAL HISTORY: Past Surgical History:  Procedure Laterality Date  . BREAST REDUCTION SURGERY Left 11/20/2017   Procedure: left breast reduction for symmetry and liposuction  bilateral lateral breasts;  Surgeon: Wallace Going, DO;  Location: [facility];  Service: Plastics;  Laterality: Left;  . BREAST SURGERY     rt breast lumpectomy  . LIPOSUCTION    . TUBAL LIGATION      FAMILY HISTORY: Family History  Problem Relation Age of Onset  . Hypertension Mother     SOCIAL HISTORY:  Social History   Socioeconomic History  . Marital status: Legally Separated    Spouse name: Not on file  . Number of children: Not on file  . Years of education: Not on file  . Highest education level: Not on file  Occupational History  . Not on file  Tobacco Use  . Smoking status: Former Smoker    Quit date: 05/17/1974    Years since quitting: 44.9  . Smokeless tobacco: Never Used  Substance and Sexual Activity  . Alcohol use: Yes    Alcohol/week: 7.0 standard drinks    Types: 7 Standard drinks or equivalent per week    Comment: one glass of wine with dinner/fim  . Drug use: No    Types: Marijuana  . Sexual activity: Not Currently  Other Topics Concern  . Not on file  Social History Narrative  . Not on file   Social Determinants of Health   Financial Resource Strain:   . Difficulty of Paying Living Expenses: Not on file  Food Insecurity:   . Worried About Charity fundraiser in the Last Year: Not on file  . Ran Out of Food in the Last Year: Not on file  Transportation Needs:   . Lack of Transportation (Medical): Not on file  . Lack of Transportation (Non-Medical): Not on file  Physical Activity:   . Days of Exercise per Week: Not on file  . Minutes of Exercise per Session: Not on file  Stress:   . Feeling of Stress : Not on file  Social Connections:   . Frequency of Communication with Friends and Family: Not on file  . Frequency of Social Gatherings with Friends and Family: Not on file  . Attends Religious Services: Not on file  . Active Member of Clubs or Organizations: Not on file  . Attends Archivist Meetings: Not on  file  . Marital Status: Not on file  Intimate Partner Violence:   . Fear of Current or Ex-Partner: Not on file  . Emotionally Abused: Not on file  . Physically Abused: Not on file  . Sexually Abused: Not on file     PHYSICAL EXAM  Vitals:   04/08/19 1138  BP: 122/85  Pulse: 90  Temp: (!) 97.2 F (36.2 C)  SpO2: 98%  Weight: 241 lb 8 oz (109.5 kg)  Height: 5\' 5"  (1.651 m)    Body mass index is 40.19 kg/m.   General: The patient is well-developed and well-nourished and in no acute distress   Musculoskeletal:  She notes bilateral subacromial bursa tender over the right greater than left subacromial bursa.  There also is some tenderness over the trapezius and paraspinal muscles on the right more than left.  She is tender over the trochanteric bursae bilaterally with milder tenderness over the piriformis muscles bilaterally.    Neurologic Exam  Mental status: The patient is alert and oriented x 3 at the time of the examination. The patient has apparent normal recent and remote memory, with an apparently normal attention span and concentration ability.    Cranial nerves:    Facial strength is normal.  Trapezius strength is normal.  No dysarthria is noted.    No obvious hearing deficits are noted.  Motor:  Muscle bulk and tone are normal. Strength is 5/5 in the arms or legs.   Gait and station: Station is normal.  She has an arthritic gait.  Tandem gait is mildly wide.  DTRs:   Reflexes are normal and symmetric in the arms and legs.     DIAGNOSTIC DATA (LABS, IMAGING, TESTING) - I reviewed patient records, labs, notes, testing and imaging myself where available.     ASSESSMENT AND PLAN   1. Trochanteric bursitis of left hip   2. Back pain, lumbosacral   3. Trochanteric bursitis, right hip   4. Subacromial bursitis of both shoulders   5. Bilateral sciatica   6. Vertigo     1.   Bilateral trochanteric bursae (hips) with 6 mg Betamethasone in [place] using  sterile technique.  She tolerated the procedure well and there were no complications. 2.   Inject bilateral subacromial bursa (shoulder) with 6 mg Betamethasone total and [place] using sterile technique.  She tolerated the procedure well there were no complications. 3.   Continue Percocet.  The [place] controlled substance database was reviewed.  3.   Try to stay active and exercises tolerated. 4.   She will return to see me in 4 months, sooner if she  has new or worsening neurologic symptoms.    Alexsys Eskin A. Felecia Shelling, MD, PhD [id number], [id number] PM Certified in Neurology, Clinical Neurophysiology, Sleep Medicine, Pain Medicine and Neuroimaging  [facility] [address] [place], [place] [address] [phone]

## 2019-04-12 MED ORDER — OXYCODONE-ACETAMINOPHEN 10-325 MG PO TABS: 1.0000 | ORAL_TABLET | Freq: Four times a day (QID) | ORAL | 0 refills | Status: DC | PRN

## 2019-04-12 MED ORDER — HYDROXYZINE PAMOATE 25 MG PO CAPS: 25.0000 mg | ORAL_CAPSULE | Freq: Three times a day (TID) | ORAL | 1 refills | Status: DC | PRN

## 2019-04-12 MED ORDER — FLUCONAZOLE 150 MG PO TABS: 150.0000 mg | ORAL_TABLET | Freq: Every day | ORAL | 0 refills | Status: DC

## 2019-04-12 NOTE — Telephone Encounter (Signed)
Called [facility] and spoke with [place]. They are not showing an rx's on file for pt.

## 2019-05-27 ENCOUNTER — Other Ambulatory Visit: Payer: Self-pay | Admitting: Neurology

## 2019-05-27 DIAGNOSIS — M545 Low back pain, unspecified: Secondary | ICD-10-CM

## 2019-05-27 MED ORDER — OXYCODONE-ACETAMINOPHEN 10-325 MG PO TABS: 1.0000 | ORAL_TABLET | Freq: Four times a day (QID) | ORAL | 0 refills | Status: DC | PRN

## 2019-05-27 NOTE — Telephone Encounter (Signed)
Pt has called for a refill on her oxyCODONE-acetaminophen (PERCOCET) 10-325 MG tablet [place] [id number]

## 2019-05-27 NOTE — Addendum Note (Signed)
Addended by: Hope Pigeon on: 05/27/2019 10:52 AM   Modules accepted: Orders

## 2019-06-14 ENCOUNTER — Other Ambulatory Visit: Payer: Self-pay | Admitting: Family Medicine

## 2019-06-14 MED ORDER — VITAMIN D (ERGOCALCIFEROL) 1.25 MG (50000 UNIT) PO CAPS: 50000.0000 [IU] | ORAL_CAPSULE | ORAL | 3 refills | Status: DC

## 2019-06-28 ENCOUNTER — Other Ambulatory Visit: Payer: Self-pay | Admitting: Neurology

## 2019-06-28 DIAGNOSIS — M545 Low back pain, unspecified: Secondary | ICD-10-CM

## 2019-06-28 MED ORDER — OXYCODONE-ACETAMINOPHEN 10-325 MG PO TABS: 1.0000 | ORAL_TABLET | Freq: Four times a day (QID) | ORAL | 0 refills | Status: DC | PRN

## 2019-06-28 NOTE — Telephone Encounter (Signed)
Pt has called for a refill on her oxyCODONE-acetaminophen (PERCOCET) 10-325 MG tablet [place] [id number]

## 2019-06-28 NOTE — Addendum Note (Signed)
Addended by: Wyvonnia Lora on: 06/28/2019 02:56 PM   Modules accepted: Orders

## 2019-07-26 ENCOUNTER — Other Ambulatory Visit: Payer: Self-pay | Admitting: Neurology

## 2019-07-26 DIAGNOSIS — M545 Low back pain, unspecified: Secondary | ICD-10-CM

## 2019-07-26 MED ORDER — OXYCODONE-ACETAMINOPHEN 10-325 MG PO TABS: 1.0000 | ORAL_TABLET | Freq: Four times a day (QID) | ORAL | 0 refills | Status: DC | PRN

## 2019-07-26 NOTE — Telephone Encounter (Signed)
Pt is needing a refill on her oxyCODONE-acetaminophen (PERCOCET) 10-325 MG tablet sent in to the [facility] in [place]

## 2019-08-02 DIAGNOSIS — Z79810 Long term (current) use of selective estrogen receptor modulators (SERMs): Secondary | ICD-10-CM | POA: Diagnosis not present

## 2019-08-02 DIAGNOSIS — Z08 Encounter for follow-up examination after completed treatment for malignant neoplasm: Secondary | ICD-10-CM | POA: Diagnosis not present

## 2019-08-02 DIAGNOSIS — C50511 Malignant neoplasm of lower-outer quadrant of right female breast: Secondary | ICD-10-CM | POA: Diagnosis not present

## 2019-08-02 DIAGNOSIS — Z170 Estrogen receptor positive status [ER+]: Secondary | ICD-10-CM | POA: Diagnosis not present

## 2019-08-02 DIAGNOSIS — Z5181 Encounter for therapeutic drug level monitoring: Secondary | ICD-10-CM | POA: Diagnosis not present

## 2019-08-02 DIAGNOSIS — Z853 Personal history of malignant neoplasm of breast: Secondary | ICD-10-CM | POA: Diagnosis not present

## 2019-08-23 ENCOUNTER — Ambulatory Visit: Payer: Medicare HMO | Admitting: Neurology

## 2019-08-25 ENCOUNTER — Other Ambulatory Visit: Payer: Self-pay

## 2019-08-25 ENCOUNTER — Ambulatory Visit (INDEPENDENT_AMBULATORY_CARE_PROVIDER_SITE_OTHER): Payer: Medicare HMO | Admitting: Neurology

## 2019-08-25 ENCOUNTER — Encounter: Payer: Self-pay | Admitting: Neurology

## 2019-08-25 VITALS — BP 133/76 | HR 88 | Temp 97.6°F | Ht 65.0 in | Wt 231.0 lb

## 2019-08-25 DIAGNOSIS — M7550 Bursitis of unspecified shoulder: Secondary | ICD-10-CM

## 2019-08-25 DIAGNOSIS — R112 Nausea with vomiting, unspecified: Secondary | ICD-10-CM | POA: Diagnosis not present

## 2019-08-25 DIAGNOSIS — G8929 Other chronic pain: Secondary | ICD-10-CM

## 2019-08-25 DIAGNOSIS — M7062 Trochanteric bursitis, left hip: Secondary | ICD-10-CM | POA: Diagnosis not present

## 2019-08-25 DIAGNOSIS — M542 Cervicalgia: Secondary | ICD-10-CM

## 2019-08-25 DIAGNOSIS — R1110 Vomiting, unspecified: Secondary | ICD-10-CM | POA: Insufficient documentation

## 2019-08-25 DIAGNOSIS — M545 Low back pain, unspecified: Secondary | ICD-10-CM

## 2019-08-25 DIAGNOSIS — R42 Dizziness and giddiness: Secondary | ICD-10-CM | POA: Diagnosis not present

## 2019-08-25 MED ORDER — OXYCODONE-ACETAMINOPHEN 10-325 MG PO TABS: 1.0000 | ORAL_TABLET | Freq: Four times a day (QID) | ORAL | 0 refills | Status: DC | PRN

## 2019-08-25 MED ORDER — VITAMIN D (ERGOCALCIFEROL) 1.25 MG (50000 UNIT) PO CAPS: 50000.0000 [IU] | ORAL_CAPSULE | ORAL | 3 refills | Status: DC

## 2019-08-25 MED ORDER — METHYLPREDNISOLONE 4 MG PO TABS: ORAL_TABLET | ORAL | 0 refills | Status: DC

## 2019-08-25 MED ORDER — ALPRAZOLAM 0.5 MG PO TABS: ORAL_TABLET | ORAL | 0 refills | Status: DC

## 2019-08-25 MED ORDER — AZITHROMYCIN 250 MG PO TABS: ORAL_TABLET | ORAL | 0 refills | Status: DC

## 2019-08-25 NOTE — Progress Notes (Signed)
t  [facility]  PATIENT: Kimberly Mann DOB: Jul 29, 1955  REFERRING CLINICIAN: [name]  HISTORY FROM: Patient  REASON FOR VISIT: Left shoulder and neck pain; back pain [  HISTORICAL  CHIEF COMPLAINT:  Chief Complaint  Patient presents with  . Follow-up    RM 12. Last seen 04/08/2019. She still reports chronic pain and intermittent episodes of vertigo.    HISTORY OF PRESENT ILLNESS:  Kimberly Mann is a 64 y.o. woman with neck, back, hip and shoulder pain.  Update 08/25/2019: She is feeling nauseous and has had more vertigo the past week.   She has vomited a few times.   Her vertigo occurs when she rolls over or changes position.  It does not completely resolve when she is sitting still, however, she feels she is spinning.    Looking at a bright light increases symptoms.   She had similar symptoms last year and a Z-pack helped.     Her mom died a few weeks ago.   She is feeling more depressed  She notes pain in her shoulders, hips and spine.   TPIs and joint/bursa injections usually help her for a month or two.   Currently, the worst pain is in the right shoulder and then both hips.  Pain is worse with use.  Update 04/08/2019: She reports having an allergic reactions to an antibiotic ointment she used after a skin tag was removed from her face.  She is reporting more pain in her hips.  Oxycodone helps some.    Injections have helped.    Pain is bilateral in the hips and shoulders as before....she was better for about 3 months after the last series of injections and having more pain the past month.    She also has piriformis and lumbar paraspinal pain.    All pain is worse when she uses the limbs more.    She did get some steroids with the recent antibiotic reactio  Her vertigo is about the same.    There is some positional element though she experiences some vertigo even at rest.    She has seen ENT and has done vestibular therapy.  Update 12/02/2018: She  reports the worse of her pain is in the shoulders and hips.   Shoulder pain is worse with movements.  In the past, subacromial injections help the shoulder pain and trochanteric bursa injections have helped the hip pan.  She also has more vertigo.   She feels a spinning sensation when she looks up or changes position.   She has trouble walking when this is happening.  She has fallen out of her bed.   She has had some symptoms x 15 years, but this is worse recently.   She did vestibular therapy in the past.   Sometimes she has left ear pain.   She has seen ENT and was told she has peripheral vertigo.     Update 07/29/2018: She reports that she had some benefit after the trochanteric and subacromial bursa injection from the December 2019 visit.  However, about a month ago over the benefit was worn off.  She currently has pain in both hips and also pain in the right shoulder.  The pain in the right shoulder worsens when she raises her arm over her head.  She does not note weakness.  The pain in the hips is worse with walking.  She gets some benefit from Percocet.  Meloxicam has helped better than ibuprofen and she would like  to get back on that.  She also uses the diclofenac gel over the areas that hurt the most.  Neck pain is doing about the same.  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  The neck pain is worse with movements.    Update 04/16/2018: She is reporting pain in the hips, over the trochanteric bursa.   Pain increases if she walks more.   In the past trochanteric bursae injections have helped a while.   She also notes new pain in her feet, especially the 4th and 5th digits.     She also has some pain in the lower back but this is not as bad as the hip pain.  She also is reporting pain in her right shoulder.   Pain increases with rotation some.   She also has neck pain and known C3-C6 DJD   She has breast cancer,  She is done with RadRx and still is on tamoxifen.    Her chest pain is better.      Update 12/11/2017: She had recent reconstructive surgery after breast cancer and is more sore in general (Dr. Doreen Salvage;  Also sees Dr. [facility]).   She is on tamoxifen and did RadRx.      She has pain everywhere.   She has pain in both hips and that is her worse pain.   Trochanteric bursa injections have helped in the past.   Due to her recent surgery, she would prefer to hold off on any injections today.     She also notes neck pain, worse with movements.   She has multilevel DJD C3-C6.       Update 08/05/2017: She is reporting more hip pain, bilaterally.    Pain increases if she lays on that side.  Trochanteric injections ususally help for a few months.    She also has back pain.  She has milder neck pain, worse with tirning he head.   Shoulders are only mildly tender  Update 06/04/2017: Neck, back and shoulder pain improved after the trigger point injections in October but have returned over the last month.      MRI cervical spine showed DJD at C3C4-C5C6.    Her worse pain is in the left shoulder.   Pain increases if she lays on that side.    She also has right shoulder region pain.   She also has neck pain that increases as the day goes on.   Her LB is hurting but not as bad as the neck and shoulders.   Hipas are hurting a lot over the bursae.    Bursa injections have helped in the past.    She sees Dr. Harlow Asa for breast cancer.   She had surgery and radiation and will be starting a pill x 5 days  Update 01/30/2017:    Since the last visit, her neck pain has greatly increased. She is undergoing radiation for breast cancer and she notes that laying down on the table is painful for her.   The pain is in the left neck more than the right. Additionally she is noticing more lower back pain also little worse on the left than the right. She denies any change in strength or sensation in the arms or legs. There is no change in her bladder.    Her hip and shoulder pain improved quite a bit after the  joint injections at her last visit. ________________________________ From 01/30/2017 LBP/hip pain:   She reports bilateral lower  back pain and pain that is in the buttock and hip region, generally worse on the right.   Pain does not radiate into her legs. Pain is worse with walking and with standing up from a seated position.  In the past, trochanteric bursa injection has helped.   Neck/Shoulder pain:   She reports pain in the neck and shoulders, worse on her right in the neck but in the left > right shulder.    Pain is worse with arms elevated,    Her pain also increases with external rotation and elevation.  Subacromial bursa injection often helped in the past.   However, she has had some itching following the steroid shots and it was especially bad after the last one 3-4 months ago.    ROM is mildly reduced in shoulders.    No arm weakness.   Breast cancer:   She needed surgery x 2 and is currently on RadRx for breast cancer.       Med's:  For her pain she is on Percocet 4 times a day with benefit.  Sheis also on meloxicam and tizanidine.   She has not shown any drug-seeking behavior       Migraine headaches occur about once a week. If Percocet does not help the migraine she will take a meloxicam.    REVIEW OF SYSTEMS:  Constitutional: No fevers, chills, sweats, or change in appetite Eyes: No visual changes, double vision, eye pain Ear, nose and throat: No hearing loss, ear pain, nasal congestion, sore throat Cardiovascular: No chest pain, palpitations Respiratory:  No shortness of breath at rest or with exertion.   No wheezes GastrointestinaI: No nausea, vomiting, diarrhea, abdominal pain, fecal incontinence Genitourinary:  No dysuria, urinary retention or frequency.  No nocturia. Musculoskeletal: as above Integumentary: No rash, pruritus, skin lesions Neurological: as above Psychiatric: She has som depression since breast cancer dx.  Endocrine: No palpitations, diaphoresis, change in  appetite, change in weigh or increased thirst  ALLERGIES: Allergies  Allergen Reactions  . Morphine And Related Nausea And Vomiting and Swelling  . Penicillins   . Sulfa Antibiotics     HOME MEDICATIONS: Outpatient Medications Prior to Visit  Medication Sig Dispense Refill  . Azelastine HCl 0.15 % SOLN Place 2 sprays into both nostrils 2 (two) times daily. 30 mL 5  . diclofenac sodium (VOLTAREN) 1 % GEL Apply 2 g topically 4 (four) times daily as needed. 200 g 5  . doxycycline (VIBRAMYCIN) 100 MG capsule Take 1 capsule (100 mg total) by mouth 2 (two) times daily. 20 capsule 0  . fluconazole (DIFLUCAN) 150 MG tablet Take 1 tablet (150 mg total) by mouth daily. 15 tablet 0  . Fluticasone Propionate (XHANCE) 93 MCG/ACT EXHU Place 2 puffs into both nostrils 2 (two) times daily. 32 mL 12  . hydrOXYzine (VISTARIL) 25 MG capsule Take 1 capsule (25 mg total) by mouth 3 (three) times daily as needed. 60 capsule 1  . levocetirizine (XYZAL) 5 MG tablet Take 1 tablet (5 mg total) by mouth every evening. 90 tablet 2  . meloxicam (MOBIC) 15 MG tablet Take 1 tablet (15 mg total) by mouth as needed. 90 tablet 1  . Multiple Vitamins-Minerals (MULTIVITAMIN ADULT PO)     . pantoprazole (PROTONIX) 40 MG tablet Take 1 tablet (40 mg total) by mouth daily. 30 tablet 3  . promethazine (PHENERGAN) 25 MG tablet 25 mg as needed.     . tamoxifen (NOLVADEX) 20 MG tablet Take 20 mg by  mouth daily.    Marland Kitchen triamterene-hydrochlorothiazide (MAXZIDE-25) 37.5-25 MG tablet Take 0.5 tablets by mouth daily. 90 tablet 2  . oxyCODONE-acetaminophen (PERCOCET) 10-325 MG tablet Take 1 tablet by mouth every 6 (six) hours as needed for pain. 120 tablet 0  . Vitamin D, Ergocalciferol, (DRISDOL) 1.25 MG (50000 UNIT) CAPS capsule Take 1 capsule (50,000 Units total) by mouth every 7 (seven) days. 4 capsule 3  . predniSONE (STERAPRED UNI-PAK 21 TAB) 10 MG (21) TBPK tablet Take by mouth daily. Take 6 tabs by mouth daily  for 2 days, then 5  tabs for 2 days, then 4 tabs for 2 days, then 3 tabs for 2 days, 2 tabs for 2 days, then 1 tab by mouth daily for 2 days 42 tablet 0   No facility-administered medications prior to visit.    PAST MEDICAL HISTORY: Past Medical History:  Diagnosis Date  . Anxiety   . Cancer (HCC)    breast  . Chronic back pain    takes oxycodone  . Complication of anesthesia   . Eczema   . Environmental allergies   . Headache   . Hypertension   . PONV (postoperative nausea and vomiting)   . Vertigo     PAST SURGICAL HISTORY: Past Surgical History:  Procedure Laterality Date  . BREAST REDUCTION SURGERY Left 11/20/2017   Procedure: left breast reduction for symmetry and liposuction bilateral lateral breasts;  Surgeon: Wallace Going, DO;  Location: [facility];  Service: Plastics;  Laterality: Left;  . BREAST SURGERY     rt breast lumpectomy  . LIPOSUCTION    . TUBAL LIGATION      FAMILY HISTORY: Family History  Problem Relation Age of Onset  . Hypertension Mother     SOCIAL HISTORY:  Social History   Socioeconomic History  . Marital status: Legally Separated    Spouse name: Not on file  . Number of children: Not on file  . Years of education: Not on file  . Highest education level: Not on file  Occupational History  . Not on file  Tobacco Use  . Smoking status: Former Smoker    Quit date: 05/17/1974    Years since quitting: 45.3  . Smokeless tobacco: Never Used  Substance and Sexual Activity  . Alcohol use: Yes    Alcohol/week: 7.0 standard drinks    Types: 7 Standard drinks or equivalent per week    Comment: one glass of wine with dinner/fim  . Drug use: No    Types: Marijuana  . Sexual activity: Not Currently  Other Topics Concern  . Not on file  Social History Narrative  . Not on file   Social Determinants of Health   Financial Resource Strain:   . Difficulty of Paying Living Expenses:   Food Insecurity:   . Worried About Charity fundraiser  in the Last Year:   . Arboriculturist in the Last Year:   Transportation Needs:   . Film/video editor (Medical):   Marland Kitchen Lack of Transportation (Non-Medical):   Physical Activity:   . Days of Exercise per Week:   . Minutes of Exercise per Session:   Stress:   . Feeling of Stress :   Social Connections:   . Frequency of Communication with Friends and Family:   . Frequency of Social Gatherings with Friends and Family:   . Attends Religious Services:   . Active Member of Clubs or Organizations:   . Attends Club or  Organization Meetings:   Marland Kitchen Marital Status:   Intimate Partner Violence:   . Fear of Current or Ex-Partner:   . Emotionally Abused:   Marland Kitchen Physically Abused:   . Sexually Abused:      PHYSICAL EXAM  Vitals:   08/25/19 1139  BP: 133/76  Pulse: 88  Temp: 97.6 F (36.4 C)  Weight: 231 lb (104.8 kg)  Height: 5\' 5"  (1.651 m)    Body mass index is 38.44 kg/m.   General: The patient is well-developed and well-nourished and in no acute distress   Musculoskeletal:    She notes right subacromial bursa tenderness  There also is some tenderness over the trapezius and paraspinal muscles on the right more than left.  She is tender over the trochanteric bursae bilaterally with milder tenderness over the piriformis muscles bilaterally.    Neurologic Exam  Mental status: The patient is alert and oriented x 3 at the time of the examination. The patient has apparent normal recent and remote memory, with an apparently normal attention span and concentration ability.    Cranial nerves:    Facial strength is normal.  Trapezius strength is normal.  No dysarthria is noted.    No obvious hearing deficits are noted.  Motor:  Muscle bulk and tone are normal. Strength is 5/5 in the arms or legs.   Gait and station: Station is normal.  She has an arthritic gait.  Tandem gait is mildly wide.  DTRs:   Reflexes are normal and symmetric in the arms and legs.  Maneuvers: The Dix-Hallpike  maneuver did not evoke any nystagmus.     ASSESSMENT AND PLAN   1. Vertigo   2. Back pain, lumbosacral   3. Non-intractable vomiting with nausea, unspecified vomiting type   4. Subacromial bursitis, unspecified laterality   5. Trochanteric bursitis, left hip   6. Neck pain   7. Chronic midline low back pain without sciatica     1.   Bilateral trochanteric bursae (hips) with total of 40 mg Depo-Medrol in [place] using sterile technique.  She tolerated the procedure well and there were no complications. 2.   Inject right subacromial bursa (shoulder) with 20 mg Depo-Medrol in Marcaine using sterile technique.  She tolerated the procedure well there were no complications. 3.   Continue Percocet.  The [place] controlled substance database was reviewed.  4.  The etiology of the vertigo is not clear.  She did not have nystagmus with Dix-Hallpike maneuver.  We will check an MRI of the brain to determine if there is a central process.  Thin cuts through IAC.Marland Kitchen 5.   She will return to see me in 4 months, sooner if she  has new or worsening neurologic symptoms.    Cordarrell Sane A. Felecia Shelling, MD, PhD [id number], [id number] PM Certified in Neurology, Clinical Neurophysiology, Sleep Medicine, Pain Medicine and Neuroimaging  [facility] [address] [place], [place] [address] [phone]

## 2019-08-26 ENCOUNTER — Telehealth: Payer: Self-pay | Admitting: Neurology

## 2019-08-26 MED ORDER — FLUCONAZOLE 150 MG PO TABS: 150.0000 mg | ORAL_TABLET | Freq: Every day | ORAL | 0 refills | Status: DC

## 2019-08-26 NOTE — Telephone Encounter (Signed)
Per Dr. Felecia Shelling, ok to send in rx. E-scribed to pharmacy

## 2019-08-26 NOTE — Telephone Encounter (Signed)
Called pt back. States Dr. Felecia Shelling usually give after injections. She was seen by Dr. Felecia Shelling yesterday. Advised that once he gets to the office I will speak to him. If ok, we will send into [facility] for them. I will only call back if we need to do something different. They verbalized understanding.

## 2019-08-26 NOTE — Telephone Encounter (Signed)
Pt called stating she is itching and is wanting to know if fluconazole (DIFLUCAN) 150 MG tablet can be called in for her to the [facility] on [address]

## 2019-08-31 ENCOUNTER — Telehealth: Payer: Self-pay | Admitting: Neurology

## 2019-08-31 NOTE — Telephone Encounter (Signed)
Mcarthur Rossetti Josem Kaufmann: [id number] (exp. 08/31/19 to 09/30/19) order sent to GI. They will reach out to the patient to schedule.

## 2019-09-14 ENCOUNTER — Encounter: Payer: Medicare HMO | Admitting: Family Medicine

## 2019-09-22 ENCOUNTER — Other Ambulatory Visit: Payer: Self-pay | Admitting: Neurology

## 2019-09-22 DIAGNOSIS — M545 Low back pain, unspecified: Secondary | ICD-10-CM

## 2019-09-22 MED ORDER — OXYCODONE-ACETAMINOPHEN 10-325 MG PO TABS: 1.0000 | ORAL_TABLET | Freq: Four times a day (QID) | ORAL | 0 refills | Status: DC | PRN

## 2019-09-22 NOTE — Telephone Encounter (Signed)
Pt is needing a refill on her oxyCODONE-acetaminophen (PERCOCET) 10-325 MG tablet sent to [facility] on [address]

## 2019-10-06 ENCOUNTER — Ambulatory Visit
Admission: RE | Admit: 2019-10-06 | Discharge: 2019-10-06 | Disposition: A | Discharge status: Home / Self Care | Payer: Medicare HMO | Source: Ambulatory Visit | Attending: Neurology | Admitting: Neurology

## 2019-10-06 DIAGNOSIS — R42 Dizziness and giddiness: Secondary | ICD-10-CM

## 2019-10-06 DIAGNOSIS — R112 Nausea with vomiting, unspecified: Secondary | ICD-10-CM | POA: Diagnosis not present

## 2019-10-06 MED ORDER — GADOBENATE DIMEGLUMINE 529 MG/ML IV SOLN
20.0000 mL | Freq: Once | INTRAVENOUS | Status: AC | PRN
  Administered 2019-10-06: 20 mL via INTRAVENOUS

## 2019-10-19 DIAGNOSIS — Z853 Personal history of malignant neoplasm of breast: Secondary | ICD-10-CM | POA: Diagnosis not present

## 2019-10-19 DIAGNOSIS — R928 Other abnormal and inconclusive findings on diagnostic imaging of breast: Secondary | ICD-10-CM | POA: Diagnosis not present

## 2019-10-19 DIAGNOSIS — Z170 Estrogen receptor positive status [ER+]: Secondary | ICD-10-CM | POA: Diagnosis not present

## 2019-10-19 DIAGNOSIS — C50511 Malignant neoplasm of lower-outer quadrant of right female breast: Secondary | ICD-10-CM | POA: Diagnosis not present

## 2019-10-22 ENCOUNTER — Other Ambulatory Visit: Payer: Self-pay | Admitting: Neurology

## 2019-10-22 ENCOUNTER — Encounter: Payer: Medicare HMO | Admitting: Family Medicine

## 2019-10-22 DIAGNOSIS — M545 Low back pain, unspecified: Secondary | ICD-10-CM

## 2019-10-22 NOTE — Telephone Encounter (Signed)
Pt is needing a refill on her oxyCODONE-acetaminophen (PERCOCET) 10-325 MG tablet sent in to the [facility] on [address] in [place]

## 2019-10-25 MED ORDER — OXYCODONE-ACETAMINOPHEN 10-325 MG PO TABS: 1.0000 | ORAL_TABLET | Freq: Four times a day (QID) | ORAL | 0 refills | Status: DC | PRN

## 2019-10-25 NOTE — Telephone Encounter (Signed)
Pt is up to date on her appts and is due for a refill on percocet. [place] Controlled Substance Registry checked. Pt last filled her percocet on 09/26/2019.

## 2019-10-27 DIAGNOSIS — Z170 Estrogen receptor positive status [ER+]: Secondary | ICD-10-CM | POA: Diagnosis not present

## 2019-10-27 DIAGNOSIS — C50511 Malignant neoplasm of lower-outer quadrant of right female breast: Secondary | ICD-10-CM | POA: Diagnosis not present

## 2019-11-02 ENCOUNTER — Other Ambulatory Visit: Payer: Self-pay

## 2019-11-02 ENCOUNTER — Encounter: Payer: Self-pay | Admitting: Family Medicine

## 2019-11-02 ENCOUNTER — Ambulatory Visit (INDEPENDENT_AMBULATORY_CARE_PROVIDER_SITE_OTHER): Payer: Medicare HMO | Admitting: Family Medicine

## 2019-11-02 VITALS — BP 130/78 | HR 95 | Temp 98.4°F | Ht 65.0 in | Wt 227.0 lb

## 2019-11-02 DIAGNOSIS — Z23 Encounter for immunization: Secondary | ICD-10-CM

## 2019-11-02 DIAGNOSIS — J302 Other seasonal allergic rhinitis: Secondary | ICD-10-CM | POA: Diagnosis not present

## 2019-11-02 DIAGNOSIS — E2839 Other primary ovarian failure: Secondary | ICD-10-CM

## 2019-11-02 DIAGNOSIS — E559 Vitamin D deficiency, unspecified: Secondary | ICD-10-CM

## 2019-11-02 DIAGNOSIS — Z0000 Encounter for general adult medical examination without abnormal findings: Secondary | ICD-10-CM | POA: Diagnosis not present

## 2019-11-02 LAB — LIPID PANEL
Cholesterol: 181 mg/dL (ref 0–200)
HDL: 43.7 mg/dL (ref >39.00)
LDL Cholesterol: 112 mg/dL — ABNORMAL HIGH (ref 0–99)
NonHDL: 137.28
Total CHOL/HDL Ratio: 4
Triglycerides: 128 mg/dL (ref 0.0–149.0)
VLDL: 25.6 mg/dL (ref 0.0–40.0)

## 2019-11-02 LAB — COMPREHENSIVE METABOLIC PANEL
ALT: 12 U/L (ref 0–35)
AST: 14 U/L (ref 0–37)
Albumin: 4.1 g/dL (ref 3.5–5.2)
Alkaline Phosphatase: 62 U/L (ref 39–117)
BUN: 17 mg/dL (ref 6–23)
CO2: 26 mEq/L (ref 19–32)
Calcium: 9.1 mg/dL (ref 8.4–10.5)
Chloride: 107 mEq/L (ref 96–112)
Creatinine, Ser: 0.81 mg/dL (ref 0.40–1.20)
GFR: 85.78 mL/min (ref >60.00)
Glucose, Bld: 102 mg/dL — ABNORMAL HIGH (ref 70–99)
Potassium: 3.3 mEq/L — ABNORMAL LOW (ref 3.5–5.1)
Sodium: 143 mEq/L (ref 135–145)
Total Bilirubin: 0.3 mg/dL (ref 0.2–1.2)
Total Protein: 6.8 g/dL (ref 6.0–8.3)

## 2019-11-02 LAB — CBC
HCT: 39.3 % (ref 36.0–46.0)
Hemoglobin: 13 g/dL (ref 12.0–15.0)
MCHC: 33.2 g/dL (ref 30.0–36.0)
MCV: 90.1 fl (ref 78.0–100.0)
Platelets: 232 10*3/uL (ref 150.0–400.0)
RBC: 4.36 Mil/uL (ref 3.87–5.11)
RDW: 13.9 % (ref 11.5–15.5)
WBC: 6.3 10*3/uL (ref 4.0–10.5)

## 2019-11-02 LAB — VITAMIN D 25 HYDROXY (VIT D DEFICIENCY, FRACTURES): VITD: 56.23 ng/mL (ref 30.00–100.00)

## 2019-11-02 LAB — HEMOGLOBIN A1C: Hgb A1c MFr Bld: 5.7 % (ref 4.6–6.5)

## 2019-11-02 LAB — TSH: TSH: 1.16 u[IU]/mL (ref 0.35–4.50)

## 2019-11-02 MED ORDER — LEVOCETIRIZINE DIHYDROCHLORIDE 5 MG PO TABS: 5.0000 mg | ORAL_TABLET | Freq: Every evening | ORAL | 2 refills | Status: DC

## 2019-11-02 MED ORDER — XHANCE 93 MCG/ACT NA EXHU: 2.0000 | INHALANT_SUSPENSION | Freq: Two times a day (BID) | NASAL | 12 refills | Status: DC

## 2019-11-02 MED ORDER — MONTELUKAST SODIUM 10 MG PO TABS: 10.0000 mg | ORAL_TABLET | Freq: Every day | ORAL | 3 refills | Status: DC

## 2019-11-02 NOTE — Addendum Note (Signed)
Addended by: Sharon Seller B on: 11/02/2019 03:04 PM   Modules accepted: Orders

## 2019-11-02 NOTE — Progress Notes (Signed)
Chief Complaint  Patient presents with  . Annual Exam     Well Woman Kimberly Mann is here for a complete physical.   Her last physical was >1 year ago.  Current diet: in general, a "healthy" diet. Current exercise: walking. Weight has intentionally decreased and she denies fatigue. Seatbelt? Yes  Health Maintenance Colonoscopy- Yes Shingrix- No DEXA- No Tetanus- Yes Pneumonia- No Hep C screen- Yes  Past Medical History:  Diagnosis Date  . Anxiety   . Cancer (HCC)    breast  . Chronic back pain    takes oxycodone  . Complication of anesthesia   . Eczema   . Environmental allergies   . Headache   . Hypertension   . PONV (postoperative nausea and vomiting)   . Vertigo      Past Surgical History:  Procedure Laterality Date  . BREAST REDUCTION SURGERY Left 11/20/2017   Procedure: left breast reduction for symmetry and liposuction bilateral lateral breasts;  Surgeon: Wallace Going, DO;  Location: [facility];  Service: Plastics;  Laterality: Left;  . BREAST SURGERY     rt breast lumpectomy  . LIPOSUCTION    . TUBAL LIGATION      Medications  Current Outpatient Medications on File Prior to Visit  Medication Sig Dispense Refill  . ALPRAZolam (XANAX) 0.5 MG tablet Take one or two pills before the MRi 2 tablet 0  . Azelastine HCl 0.15 % SOLN Place 2 sprays into both nostrils 2 (two) times daily. 30 mL 5  . diclofenac sodium (VOLTAREN) 1 % GEL Apply 2 g topically 4 (four) times daily as needed. 200 g 5  . doxycycline (VIBRAMYCIN) 100 MG capsule Take 1 capsule (100 mg total) by mouth 2 (two) times daily. 20 capsule 0  . fluconazole (DIFLUCAN) 150 MG tablet Take 1 tablet (150 mg total) by mouth daily. 15 tablet 0  . hydrOXYzine (VISTARIL) 25 MG capsule Take 1 capsule (25 mg total) by mouth 3 (three) times daily as needed. 60 capsule 1  . meloxicam (MOBIC) 15 MG tablet Take 1 tablet (15 mg total) by mouth as needed. 90 tablet 1  . methylPREDNISolone  (MEDROL) 4 MG tablet Taper from 6 pills po for one day to 1 pill po the last day over 6 days 21 tablet 0  . Multiple Vitamins-Minerals (MULTIVITAMIN ADULT PO)     . oxyCODONE-acetaminophen (PERCOCET) 10-325 MG tablet Take 1 tablet by mouth every 6 (six) hours as needed for pain. 120 tablet 0  . pantoprazole (PROTONIX) 40 MG tablet Take 1 tablet (40 mg total) by mouth daily. 30 tablet 3  . promethazine (PHENERGAN) 25 MG tablet 25 mg as needed.     . tamoxifen (NOLVADEX) 20 MG tablet Take 20 mg by mouth daily.    Marland Kitchen triamterene-hydrochlorothiazide (MAXZIDE-25) 37.5-25 MG tablet Take 0.5 tablets by mouth daily. 90 tablet 2  . Vitamin D, Ergocalciferol, (DRISDOL) 1.25 MG (50000 UNIT) CAPS capsule Take 1 capsule (50,000 Units total) by mouth every 7 (seven) days. 4 capsule 3   Allergies Allergies  Allergen Reactions  . Morphine And Related Nausea And Vomiting and Swelling  . Penicillins   . Sulfa Antibiotics    Review of Systems: Constitutional:  no fevers Eye:  no recent significant change in vision Ears:  No changes in hearing Nose/Mouth/Throat:  +congestion Cardiovascular: no chest pain Respiratory:  No new shortness of breath Gastrointestinal:  No change in bowel habits GU:  Female: negative for dysuria Integumentary:  no  abnormal skin lesions reported Neurologic:  No weakness Endocrine:  denies unexplained weight changes  Exam BP 130/78 (BP Location: Left Arm, Patient Position: Sitting, Cuff Size: Large)   Pulse 95   Temp 98.4 F (36.9 C) (Oral)   Ht 5\' 5"  (1.651 m)   Wt 227 lb (103 kg)   SpO2 99%   BMI 37.77 kg/m  General:  well developed, well nourished, in no apparent distress Skin:  no significant moles, warts, or growths Head:  no masses, lesions, or tenderness Eyes:  pupils equal and round, sclera anicteric without injection Ears:  canals without lesions, TMs shiny without retraction, no obvious effusion, no erythema Nose:  nares patent, septum midline, mucosa normal,  and no drainage or sinus tenderness Throat/Pharynx:  lips and gingiva without lesion; tongue and uvula midline; non-inflamed pharynx; no exudates or postnasal drainage Neck: neck supple without adenopathy, thyromegaly, or masses Lungs:  clear to auscultation, breath sounds equal bilaterally, no respiratory distress Cardio:  regular rate and rhythm, no bruits or LE edema Abdomen:  abdomen soft, nontender; bowel sounds normal; no masses or organomegaly Genital: Deferred Neuro:  gait normal; deep tendon reflexes normal and symmetric Psych: well oriented with normal range of affect and appropriate judgment/insight  Assessment and Plan  Well adult exam - Plan: CBC, Comprehensive metabolic panel, TSH, Lipid panel, Hemoglobin A1c  Vitamin D insufficiency - Plan: VITAMIN D 25 Hydroxy (Vit-D Deficiency, Fractures)  Seasonal allergies - Plan: levocetirizine (XYZAL) 5 MG tablet, Fluticasone Propionate (XHANCE) 93 MCG/ACT EXHU, montelukast (SINGULAIR) 10 MG tablet  Estrogen deficiency - Plan: DG Bone Density   Well 64 y.o. female. Counseled on diet and exercise. Other orders as above. Follow up in 6 mo. The patient voiced understanding and agreement to the plan.  [place], DO 11/02/19 10:42 AM

## 2019-11-02 NOTE — Addendum Note (Signed)
Addended by: Sharon Seller B on: 11/02/2019 10:45 AM   Modules accepted: Orders

## 2019-11-02 NOTE — Patient Instructions (Addendum)
Give [place] 2-3 business days to get the results of your labs back.   Keep the diet clean and stay active.  The new Shingrix vaccine (for shingles) is a 2 shot series. It can make people feel low energy, achy and almost like they have the flu for 48 hours after injection. Please plan accordingly when deciding on when to get this shot. Call your pharmacy for an appointment to get this. The second shot of the series is less severe regarding the side effects, but it still lasts 48 hours.   Let us know if you need anything.

## 2019-11-03 ENCOUNTER — Other Ambulatory Visit: Payer: Self-pay | Admitting: Family Medicine

## 2019-11-03 DIAGNOSIS — E876 Hypokalemia: Secondary | ICD-10-CM

## 2019-11-03 LAB — HIV ANTIBODY (ROUTINE TESTING W REFLEX): HIV 1&2 Ab, 4th Generation: NONREACTIVE

## 2019-11-03 MED ORDER — VITAMIN D (ERGOCALCIFEROL) 1.25 MG (50000 UNIT) PO CAPS: 50000.0000 [IU] | ORAL_CAPSULE | ORAL | 3 refills | Status: DC

## 2019-11-03 NOTE — Progress Notes (Signed)
bmp 

## 2019-11-08 ENCOUNTER — Other Ambulatory Visit: Payer: Self-pay | Admitting: Family Medicine

## 2019-11-15 ENCOUNTER — Other Ambulatory Visit: Payer: Self-pay | Admitting: Family Medicine

## 2019-11-15 MED ORDER — FLUTICASONE PROPIONATE 50 MCG/ACT NA SUSP: 2.0000 | Freq: Every day | NASAL | 3 refills | Status: DC

## 2019-11-16 ENCOUNTER — Other Ambulatory Visit: Payer: Self-pay | Admitting: Family Medicine

## 2019-11-16 ENCOUNTER — Ambulatory Visit (HOSPITAL_BASED_OUTPATIENT_CLINIC_OR_DEPARTMENT_OTHER)
Admission: RE | Admit: 2019-11-16 | Discharge: 2019-11-16 | Disposition: A | Discharge status: Home / Self Care | Payer: Medicare HMO | Source: Ambulatory Visit | Attending: Family Medicine | Admitting: Family Medicine

## 2019-11-16 ENCOUNTER — Other Ambulatory Visit (INDEPENDENT_AMBULATORY_CARE_PROVIDER_SITE_OTHER): Payer: Medicare HMO

## 2019-11-16 ENCOUNTER — Other Ambulatory Visit: Payer: Self-pay

## 2019-11-16 DIAGNOSIS — E876 Hypokalemia: Secondary | ICD-10-CM

## 2019-11-16 DIAGNOSIS — E2839 Other primary ovarian failure: Secondary | ICD-10-CM | POA: Insufficient documentation

## 2019-11-16 DIAGNOSIS — M85851 Other specified disorders of bone density and structure, right thigh: Secondary | ICD-10-CM | POA: Diagnosis not present

## 2019-11-16 LAB — BASIC METABOLIC PANEL
BUN: 14 mg/dL (ref 6–23)
CO2: 27 mEq/L (ref 19–32)
Calcium: 9 mg/dL (ref 8.4–10.5)
Chloride: 107 mEq/L (ref 96–112)
Creatinine, Ser: 0.82 mg/dL (ref 0.40–1.20)
GFR: 84.56 mL/min (ref >60.00)
Glucose, Bld: 97 mg/dL (ref 70–99)
Potassium: 3.4 mEq/L — ABNORMAL LOW (ref 3.5–5.1)
Sodium: 142 mEq/L (ref 135–145)

## 2019-11-16 MED ORDER — POTASSIUM CHLORIDE CRYS ER 10 MEQ PO TBCR: 10.0000 meq | EXTENDED_RELEASE_TABLET | Freq: Two times a day (BID) | ORAL | 0 refills | Status: DC

## 2019-11-16 NOTE — Progress Notes (Signed)
bmp 

## 2019-11-19 DIAGNOSIS — H524 Presbyopia: Secondary | ICD-10-CM | POA: Diagnosis not present

## 2019-11-19 DIAGNOSIS — H43393 Other vitreous opacities, bilateral: Secondary | ICD-10-CM | POA: Diagnosis not present

## 2019-11-19 DIAGNOSIS — H0288B Meibomian gland dysfunction left eye, upper and lower eyelids: Secondary | ICD-10-CM | POA: Diagnosis not present

## 2019-11-19 DIAGNOSIS — H25013 Cortical age-related cataract, bilateral: Secondary | ICD-10-CM | POA: Diagnosis not present

## 2019-11-19 DIAGNOSIS — H0288A Meibomian gland dysfunction right eye, upper and lower eyelids: Secondary | ICD-10-CM | POA: Diagnosis not present

## 2019-11-19 DIAGNOSIS — H52203 Unspecified astigmatism, bilateral: Secondary | ICD-10-CM | POA: Diagnosis not present

## 2019-11-19 DIAGNOSIS — H5203 Hypermetropia, bilateral: Secondary | ICD-10-CM | POA: Diagnosis not present

## 2019-11-19 DIAGNOSIS — H2513 Age-related nuclear cataract, bilateral: Secondary | ICD-10-CM | POA: Diagnosis not present

## 2019-11-19 DIAGNOSIS — H02831 Dermatochalasis of right upper eyelid: Secondary | ICD-10-CM | POA: Diagnosis not present

## 2019-11-22 ENCOUNTER — Other Ambulatory Visit: Payer: Self-pay | Admitting: Neurology

## 2019-11-22 DIAGNOSIS — M545 Low back pain, unspecified: Secondary | ICD-10-CM

## 2019-11-22 MED ORDER — OXYCODONE-ACETAMINOPHEN 10-325 MG PO TABS: 1.0000 | ORAL_TABLET | Freq: Four times a day (QID) | ORAL | 0 refills | Status: DC | PRN

## 2019-11-22 NOTE — Addendum Note (Signed)
Addended by: Wyvonnia Lora on: 11/22/2019 04:34 PM   Modules accepted: Orders

## 2019-11-22 NOTE — Telephone Encounter (Signed)
Pt is needing a refill on her oxyCODONE-acetaminophen (PERCOCET) 10-325 MG tablet sent in to the [facility] in [place] on [address]

## 2019-11-26 ENCOUNTER — Other Ambulatory Visit: Payer: Self-pay

## 2019-11-26 ENCOUNTER — Encounter (HOSPITAL_BASED_OUTPATIENT_CLINIC_OR_DEPARTMENT_OTHER): Payer: Self-pay

## 2019-11-26 ENCOUNTER — Emergency Department (HOSPITAL_BASED_OUTPATIENT_CLINIC_OR_DEPARTMENT_OTHER)
Admission: EM | Admit: 2019-11-26 | Discharge: 2019-11-26 | Disposition: A | Discharge status: Home / Self Care | Payer: Medicare HMO | Attending: Emergency Medicine | Admitting: Emergency Medicine

## 2019-11-26 ENCOUNTER — Emergency Department (HOSPITAL_BASED_OUTPATIENT_CLINIC_OR_DEPARTMENT_OTHER): Payer: Medicare HMO

## 2019-11-26 DIAGNOSIS — R109 Unspecified abdominal pain: Secondary | ICD-10-CM | POA: Insufficient documentation

## 2019-11-26 DIAGNOSIS — I10 Essential (primary) hypertension: Secondary | ICD-10-CM | POA: Insufficient documentation

## 2019-11-26 DIAGNOSIS — R079 Chest pain, unspecified: Secondary | ICD-10-CM

## 2019-11-26 DIAGNOSIS — R0602 Shortness of breath: Secondary | ICD-10-CM | POA: Diagnosis not present

## 2019-11-26 DIAGNOSIS — Z853 Personal history of malignant neoplasm of breast: Secondary | ICD-10-CM | POA: Insufficient documentation

## 2019-11-26 DIAGNOSIS — R0789 Other chest pain: Secondary | ICD-10-CM | POA: Insufficient documentation

## 2019-11-26 LAB — HEPATIC FUNCTION PANEL
ALT: 16 U/L (ref 0–44)
AST: 24 U/L (ref 15–41)
Albumin: 4.3 g/dL (ref 3.5–5.0)
Alkaline Phosphatase: 50 U/L (ref 38–126)
Bilirubin, Direct: <0.1 mg/dL (ref 0.0–0.2)
Total Bilirubin: 0.3 mg/dL (ref 0.3–1.2)
Total Protein: 7.9 g/dL (ref 6.5–8.1)

## 2019-11-26 LAB — LIPASE, BLOOD: Lipase: 36 U/L (ref 11–51)

## 2019-11-26 LAB — CBC
HCT: 43.4 % (ref 36.0–46.0)
Hemoglobin: 13.9 g/dL (ref 12.0–15.0)
MCH: 29.4 pg (ref 26.0–34.0)
MCHC: 32 g/dL (ref 30.0–36.0)
MCV: 91.9 fL (ref 80.0–100.0)
Platelets: 258 10*3/uL (ref 150–400)
RBC: 4.72 MIL/uL (ref 3.87–5.11)
RDW: 13.4 % (ref 11.5–15.5)
WBC: 5.7 10*3/uL (ref 4.0–10.5)
nRBC: 0 % (ref 0.0–0.2)

## 2019-11-26 LAB — BASIC METABOLIC PANEL
Anion gap: 14 (ref 5–15)
BUN: 15 mg/dL (ref 8–23)
CO2: 22 mmol/L (ref 22–32)
Calcium: 9.8 mg/dL (ref 8.9–10.3)
Chloride: 104 mmol/L (ref 98–111)
Creatinine, Ser: 0.84 mg/dL (ref 0.44–1.00)
GFR calc Af Amer: >60 mL/min (ref >60)
GFR calc non Af Amer: >60 mL/min (ref >60)
Glucose, Bld: 83 mg/dL (ref 70–99)
Potassium: 3.4 mmol/L — ABNORMAL LOW (ref 3.5–5.1)
Sodium: 140 mmol/L (ref 135–145)

## 2019-11-26 LAB — TROPONIN I (HIGH SENSITIVITY)
Troponin I (High Sensitivity): 5 ng/L (ref <18)
Troponin I (High Sensitivity): 5 ng/L (ref <18)

## 2019-11-26 MED ORDER — SUCRALFATE 1 GM/10ML PO SUSP
1.0000 g | Freq: Three times a day (TID) | ORAL | 0 refills | Status: DC
Start: 2019-11-26 — End: 2019-12-22

## 2019-11-26 MED ORDER — SODIUM CHLORIDE 0.9% FLUSH
3.0000 mL | Freq: Once | INTRAVENOUS | Status: DC
  Filled 2019-11-26: qty 3

## 2019-11-26 MED ORDER — LIDOCAINE VISCOUS HCL 2 % MT SOLN
15.0000 mL | Freq: Once | OROMUCOSAL | Status: AC
  Administered 2019-11-26: 15 mL via ORAL
  Filled 2019-11-26: qty 15

## 2019-11-26 MED ORDER — ALUM & MAG HYDROXIDE-SIMETH 200-200-20 MG/5ML PO SUSP
30.0000 mL | Freq: Once | ORAL | Status: AC
  Administered 2019-11-26: 30 mL via ORAL
  Filled 2019-11-26: qty 30

## 2019-11-26 MED ORDER — POTASSIUM CHLORIDE CRYS ER 20 MEQ PO TBCR
40.0000 meq | EXTENDED_RELEASE_TABLET | Freq: Once | ORAL | Status: AC
  Administered 2019-11-26: 40 meq via ORAL
  Filled 2019-11-26: qty 2

## 2019-11-26 NOTE — ED Provider Notes (Signed)
[facility] EMERGENCY DEPARTMENT Provider Note   CSN: [id number] Arrival date & time: 11/26/19  1332     History Chief Complaint  Patient presents with  . Chest Pain    Kimberly Mann is a 64 y.o. female.  HPI 64 year old female presents with chest pain.  Started around 1 AM.  She fell asleep in the recliner and woke up with this discomfort.  Feels like an indigestion and like she has to burp.  Thought she might need to throw up.  Somewhat better with Tums though now it is coming back.  Pain was a 10/10 when she came in but now seven.  Sometimes this pain makes her feel short of breath.  A little bit of abdominal discomfort.  She cannot describe the pain besides saying it feels like indigestion.   Past Medical History:  Diagnosis Date  . Anxiety   . Cancer (HCC)    breast  . Chronic back pain    takes oxycodone  . Complication of anesthesia   . Eczema   . Environmental allergies   . Headache   . Hypertension   . PONV (postoperative nausea and vomiting)   . Vertigo     Patient Active Problem List   Diagnosis Date Noted  . Non-intractable vomiting 08/25/2019  . Chronic rhinitis 03/20/2018  . Adverse food reaction 03/20/2018  . Drug reaction 03/20/2018  . Seasonal allergies 01/22/2018  . Breast cancer ([place]) 12/11/2017  . Vertigo 06/16/2017  . Essential hypertension 06/16/2017  . Trochanteric bursitis, left hip 01/20/2017  . Trochanteric bursitis, right hip 09/18/2016  . Ear pain, bilateral 09/18/2016  . Back pain, lumbosacral 05/20/2016  . Bilateral shoulder pain 09/13/2015  . Trochanteric bursitis of left hip 05/17/2015  . Bilateral sciatica 05/17/2015  . Subacromial bursitis 12/13/2014  . Osteoarthritis of left shoulder 05/17/2014  . Neck pain 05/17/2014  . Lower back pain 05/17/2014  . Migraine 05/17/2014    Past Surgical History:  Procedure Laterality Date  . BREAST REDUCTION SURGERY Left 11/20/2017   Procedure: left breast reduction for  symmetry and liposuction bilateral lateral breasts;  Surgeon: Wallace Going, DO;  Location: [place];  Service: Plastics;  Laterality: Left;  . BREAST SURGERY     rt breast lumpectomy  . LIPOSUCTION    . TUBAL LIGATION       OB History    Gravida  1   Para  1   Term  1   Preterm      AB      Living        SAB      TAB      Ectopic      Multiple      Live Births  1           Family History  Problem Relation Age of Onset  . Hypertension Mother     Social History   Tobacco Use  . Smoking status: Former Smoker    Quit date: 05/17/1974    Years since quitting: 45.5  . Smokeless tobacco: Never Used  Vaping Use  . Vaping Use: Never used  Substance Use Topics  . Alcohol use: Yes    Alcohol/week: 7.0 standard drinks    Types: 7 Standard drinks or equivalent per week    Comment: one glass of wine with dinner/fim  . Drug use: No    Types: Marijuana    Home Medications Prior to Admission medications   Medication Sig Start  Date End Date Taking? Authorizing Provider  ALPRAZolam Duanne Moron) 0.5 MG tablet Take one or two pills before the [facility] 08/25/19   Sater, Nanine Means, MD  diclofenac sodium (VOLTAREN) 1 % GEL Apply 2 g topically 4 (four) times daily as needed. 04/16/18   Sater, Nanine Means, MD  fluconazole (DIFLUCAN) 150 MG tablet Take 1 tablet (150 mg total) by mouth daily. 08/26/19   Sater, Nanine Means, MD  fluticasone (FLONASE) 50 MCG/ACT nasal spray Place 2 sprays into both nostrils daily. 11/15/19   Shelda Pal, DO  hydrOXYzine (VISTARIL) 25 MG capsule Take 1 capsule (25 mg total) by mouth 3 (three) times daily as needed. 04/12/19   Sater, Nanine Means, MD  levocetirizine (XYZAL) 5 MG tablet Take 1 tablet (5 mg total) by mouth every evening. 11/02/19   Shelda Pal, DO  meloxicam (MOBIC) 15 MG tablet Take 1 tablet (15 mg total) by mouth as needed. 07/29/18   Sater, Nanine Means, MD  montelukast (SINGULAIR) 10 MG tablet Take 1 tablet  (10 mg total) by mouth at bedtime. 11/02/19   Shelda Pal, DO  Multiple Vitamins-Minerals (MULTIVITAMIN ADULT PO)  03/29/15   [provider]  oxyCODONE-acetaminophen (PERCOCET) 10-325 MG tablet Take 1 tablet by mouth every 6 (six) hours as needed for pain. 11/22/19   Sater, Nanine Means, MD  pantoprazole (PROTONIX) 40 MG tablet Take 1 tablet (40 mg total) by mouth daily. 11/18/18   Shelda Pal, DO  potassium chloride (KLOR-CON M10) 10 MEQ tablet Take 1 tablet (10 mEq total) by mouth 2 (two) times daily for 7 days. 11/16/19 11/23/19  Shelda Pal, DO  promethazine (PHENERGAN) 25 MG tablet 25 mg as needed.  03/05/15   [provider]  sucralfate (CARAFATE) 1 GM/10ML suspension Take 10 mLs (1 g total) by mouth 4 (four) times daily -  with meals and at bedtime. 11/26/19   Sherwood Gambler, MD  tamoxifen (NOLVADEX) 20 MG tablet Take 20 mg by mouth daily.    [provider]  triamterene-hydrochlorothiazide (MAXZIDE-25) 37.5-25 MG tablet Take 0.5 tablets by mouth daily. 03/16/19   Shelda Pal, DO  Vitamin D, Ergocalciferol, (DRISDOL) 1.25 MG (50000 UNIT) CAPS capsule Take 1 capsule (50,000 Units total) by mouth every 7 (seven) days. 11/03/19   Shelda Pal, DO    Allergies    Morphine and related, Penicillins, and Sulfa antibiotics  Review of Systems   Review of Systems  Constitutional: Negative for fever.  Respiratory: Positive for shortness of breath.   Cardiovascular: Positive for chest pain.  Gastrointestinal: Positive for abdominal pain. Negative for vomiting.  All other systems reviewed and are negative.   Physical Exam Updated Vital Signs BP (!) 163/87 (BP Location: Right Arm)   Pulse 75   Temp 98 F (36.7 C) (Oral)   Resp 20   Ht 5\' 5"  (1.651 m)   Wt (!) 103.8 kg   SpO2 100%   BMI 38.09 kg/m   Physical Exam Vitals and nursing note reviewed.  Constitutional:      General: She is not in acute distress.     Appearance: She is well-developed. She is obese. She is not ill-appearing or diaphoretic.  HENT:     Head: Normocephalic and atraumatic.     Right Ear: External ear normal.     Left Ear: External ear normal.     Nose: Nose normal.  Eyes:     General:        Right eye: No  discharge.        Left eye: No discharge.  Cardiovascular:     Rate and Rhythm: Normal rate and regular rhythm.     Heart sounds: Normal heart sounds.  Pulmonary:     Effort: Pulmonary effort is normal.     Breath sounds: Normal breath sounds.  Chest:     Chest wall: No tenderness.  Abdominal:     Palpations: Abdomen is soft.     Tenderness: There is abdominal tenderness (mild) in the right upper quadrant, epigastric area and left upper quadrant.  Skin:    General: Skin is warm and dry.  Neurological:     Mental Status: She is alert.  Psychiatric:        Mood and Affect: Mood is not anxious.     ED Results / Procedures / Treatments   Labs (all labs ordered are listed, but only abnormal results are displayed) Labs Reviewed  BASIC METABOLIC PANEL - Abnormal; Notable for the following components:      Result Value   Potassium 3.4 (*)    All other components within normal limits  CBC  HEPATIC FUNCTION PANEL  LIPASE, BLOOD  TROPONIN I (HIGH SENSITIVITY)  TROPONIN I (HIGH SENSITIVITY)    EKG EKG Interpretation  Date/Time:  Friday November 26 2019 13:42:02 EDT Ventricular Rate:  74 PR Interval:  158 QRS Duration: 80 QT Interval:  388 QTC Calculation: 430 R Axis:   36 Text Interpretation: Normal sinus rhythm nonspecific ST waves similar to 2019 Confirmed by Sherwood Gambler [phone]) on 11/26/2019 5:31:15 PM   Radiology DG Chest 2 View  Result Date: 11/26/2019 CLINICAL DATA:  Chest pain. EXAM: CHEST - 2 VIEW COMPARISON:  None. FINDINGS: The heart size and mediastinal contours are within normal limits. Both lungs are clear. No pneumothorax or pleural effusion is noted. The visualized skeletal structures  are unremarkable. IMPRESSION: No active cardiopulmonary disease. Electronically Signed   By: Marijo Conception M.D.   On: 11/26/2019 14:30    Procedures Procedures (including critical care time)  Medications Ordered in ED Medications  sodium chloride flush (NS) 0.9 % injection 3 mL (3 mLs Intravenous Not Given 11/26/19 1807)  alum & mag hydroxide-simeth (MAALOX/MYLANTA) 200-200-20 MG/5ML suspension 30 mL (30 mLs Oral Given 11/26/19 1816)    And  lidocaine (XYLOCAINE) 2 % viscous mouth solution 15 mL (15 mLs Oral Given 11/26/19 1816)  potassium chloride SA (KLOR-CON) CR tablet 40 mEq (40 mEq Oral Given 11/26/19 2021)    ED Course  I have reviewed the triage vital signs and the nursing notes.  Pertinent labs & imaging results that were available during my care of the patient were reviewed by me and considered in my medical decision making (see chart for details).    MDM Rules/Calculators/A&P                          Patient's chest pain is pretty atypical. Sounds GI. Mild upper abdominal tenderness. Labs, ECG, CXR have all been reviewed, and are overall unremarkable. troponins negative x 2. My suspicion of ACS is pretty low. Doubt dissection, PE. Offered imaging of RUQ given some discomfort, but she declines. Sounds more like GERD. She is on protonix, will add carafate, have her follow up with PCP.  Final Clinical Impression(s) / ED Diagnoses Final diagnoses:  Nonspecific chest pain    Rx / DC Orders ED Discharge Orders         Ordered  sucralfate (CARAFATE) 1 GM/10ML suspension  3 times daily with meals & bedtime     Discontinue  Reprint     11/26/19 2019           Sherwood Gambler, MD 11/26/19 2316

## 2019-11-26 NOTE — Discharge Instructions (Signed)
If you develop recurrent, continued, or worsening chest pain, shortness of breath, fever, vomiting, abdominal or back pain, or any other new/concerning symptoms then return to the ER for evaluation.  

## 2019-11-26 NOTE — ED Notes (Signed)
Pt states took tums and feels better now on arrival to room.

## 2019-11-26 NOTE — ED Triage Notes (Signed)
Pt c/o CP since 1am-NAD-to triage in w/c

## 2019-11-30 ENCOUNTER — Other Ambulatory Visit: Payer: Medicare HMO

## 2019-12-08 ENCOUNTER — Other Ambulatory Visit: Payer: Self-pay | Admitting: Family Medicine

## 2019-12-08 DIAGNOSIS — H5203 Hypermetropia, bilateral: Secondary | ICD-10-CM | POA: Diagnosis not present

## 2019-12-08 DIAGNOSIS — H52209 Unspecified astigmatism, unspecified eye: Secondary | ICD-10-CM | POA: Diagnosis not present

## 2019-12-11 IMAGING — DX DG ANKLE COMPLETE 3+V*L*
3 series · 3 of 3 positions shown · non-contrast
Comparison: None.

CLINICAL DATA: Twisted ankle in hole 2 days ago, pain and swelling
to left ankle.

EXAM:
LEFT ANKLE COMPLETE - 3+ VIEW

[ankle ap]
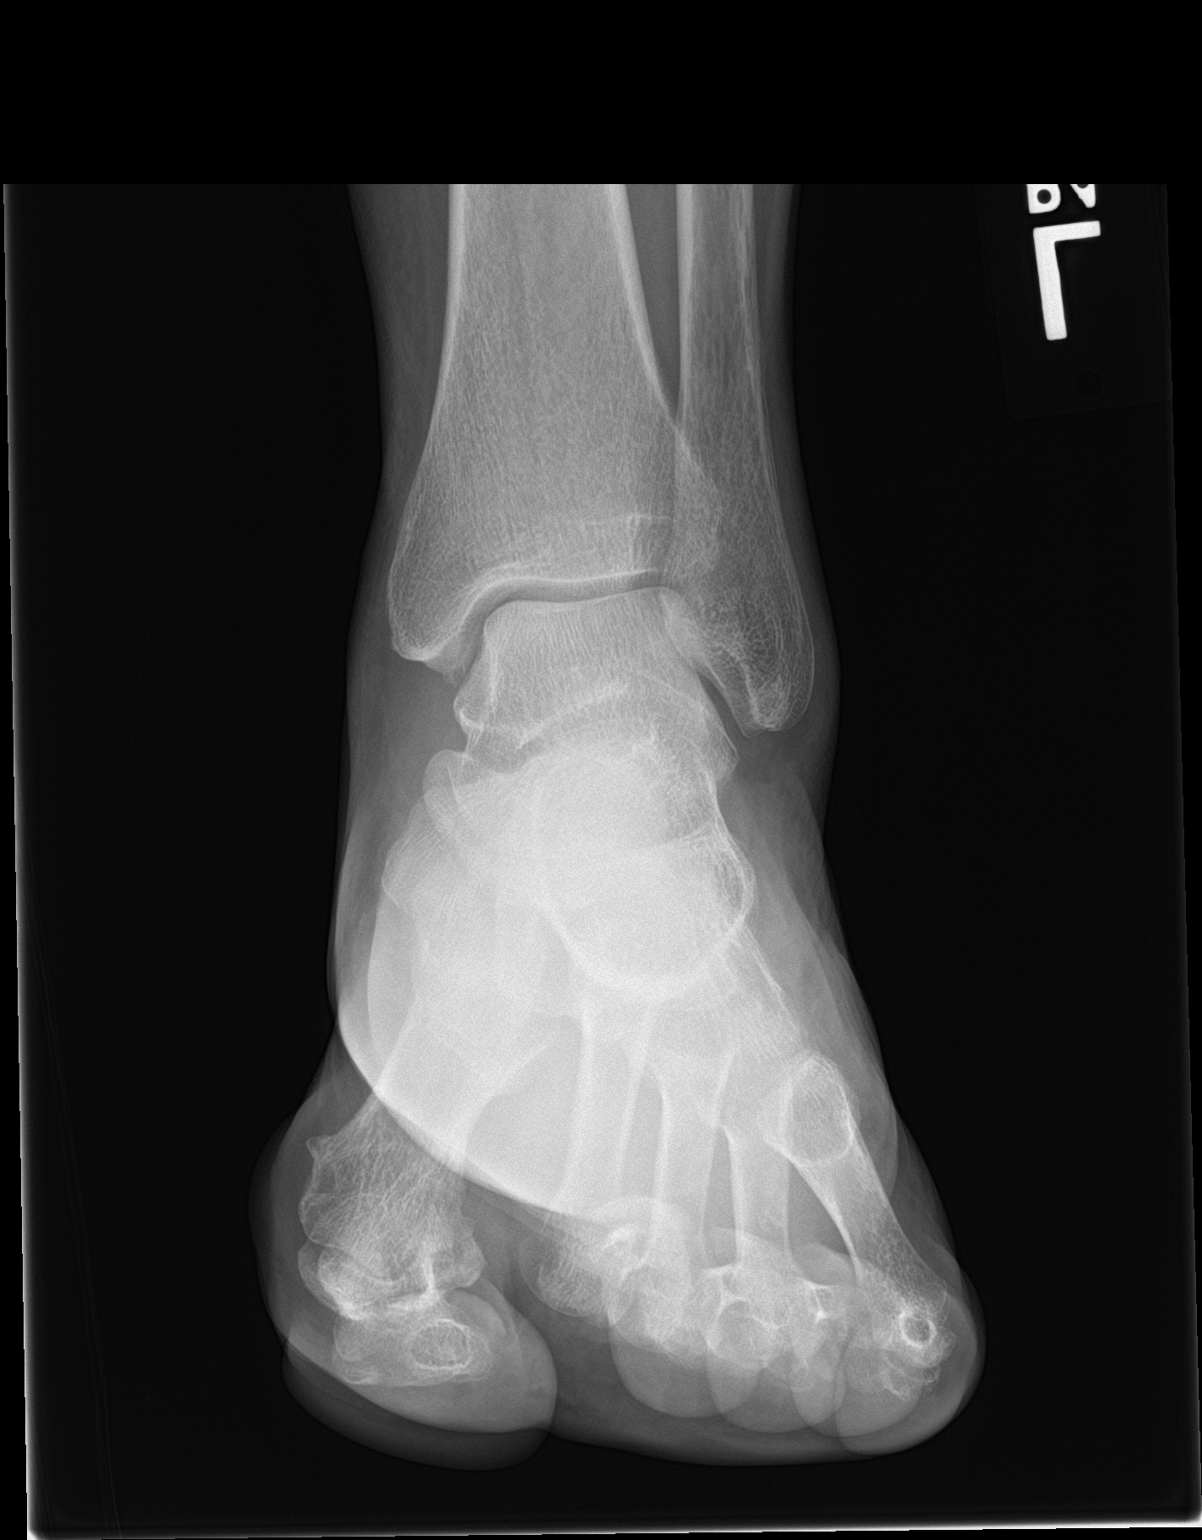

[ankle obl]
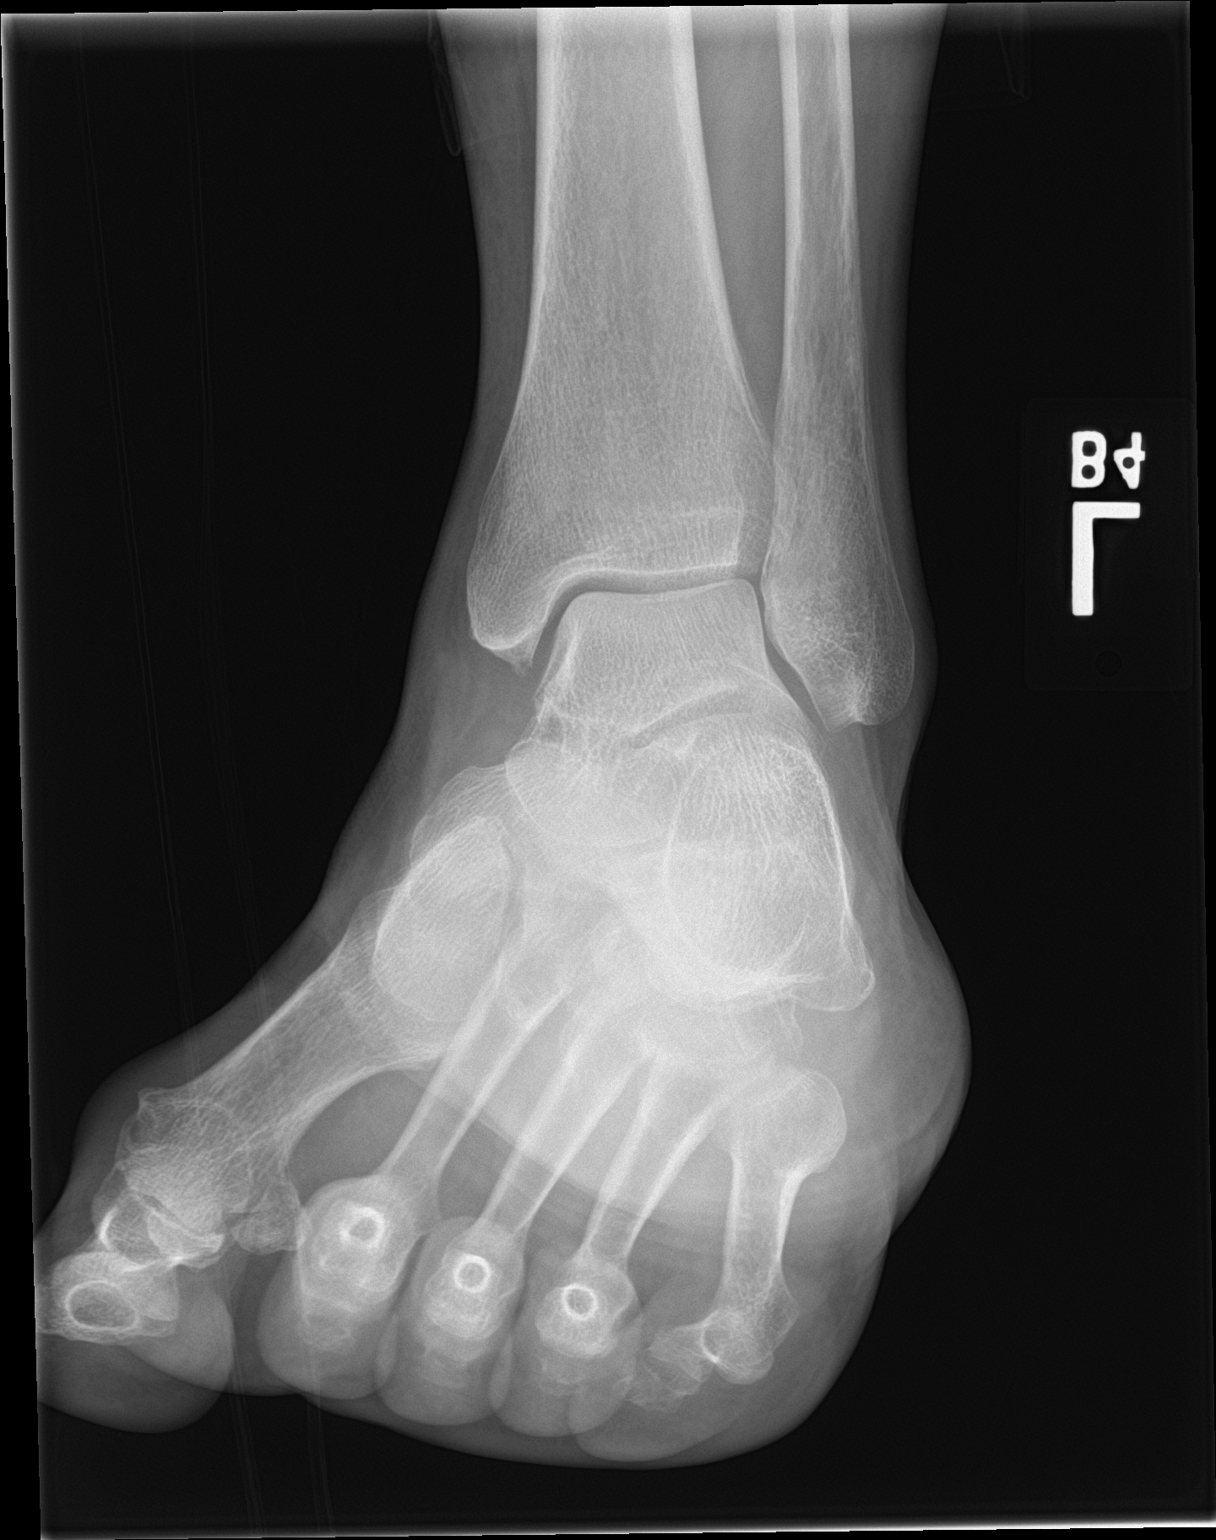

[ankle lat]
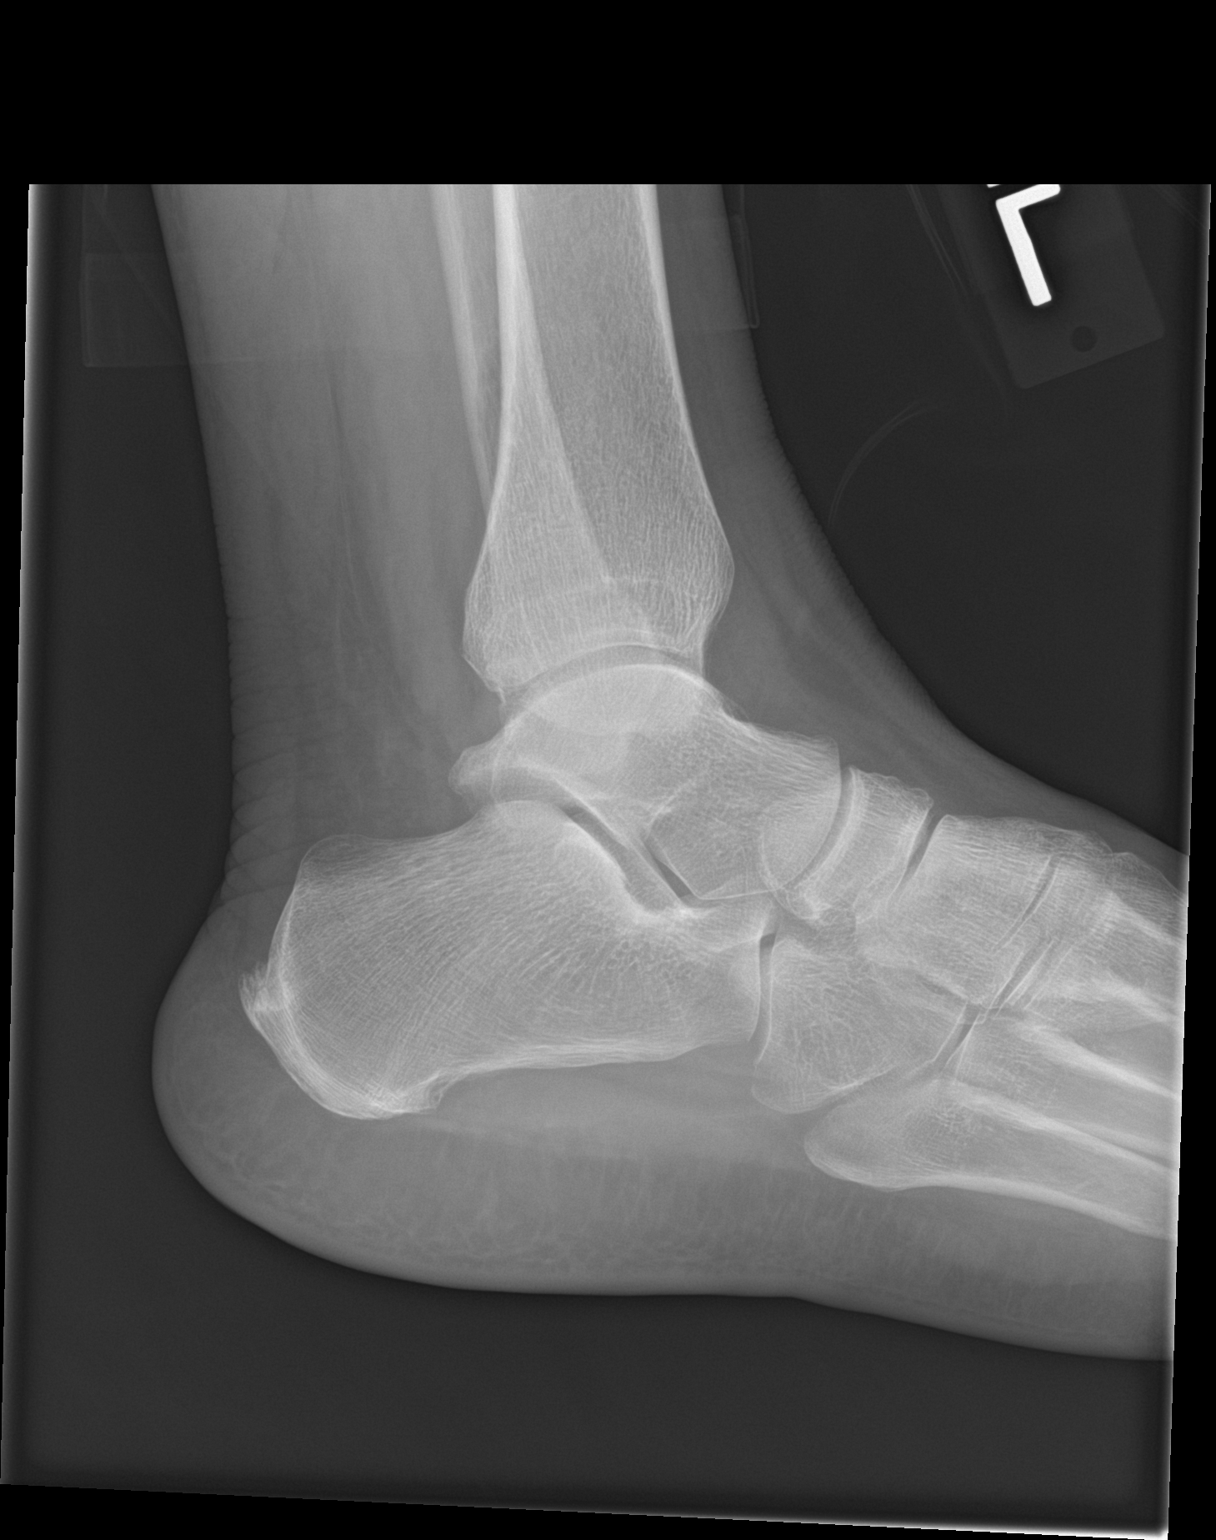

[3 of 3 positions shown; findings below may reference images not displayed]

FINDINGS: There is no evidence of fracture, dislocation, or joint effusion.
There is no evidence of arthropathy or other focal bone abnormality.
Soft tissues are unremarkable.
IMPRESSION: Negative.

## 2019-12-16 IMAGING — US US EXTREM  UP VENOUS*L*
1 series · 13 of 24 positions shown · non-contrast
Comparison: None.

CLINICAL DATA: Pain and edema of the left upper extremity.



[Series 1: us extrem up venous*left* · 0.08mm/px · 13 of 33 slices shown]
[im 1/33]
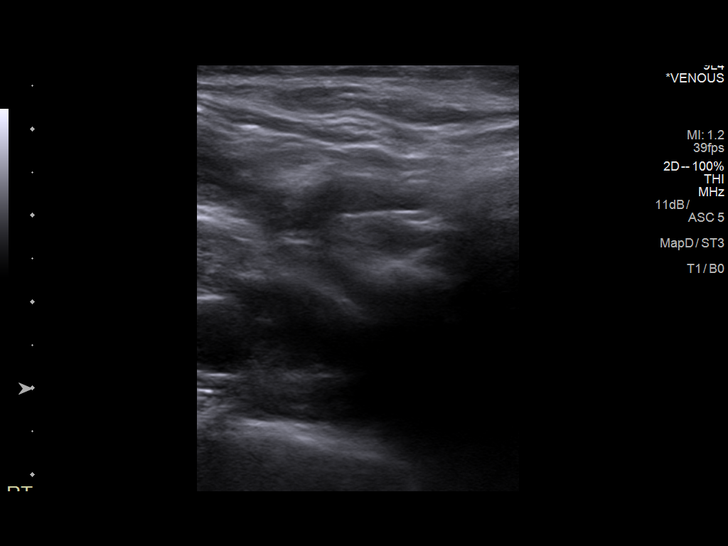
[im 3/33]
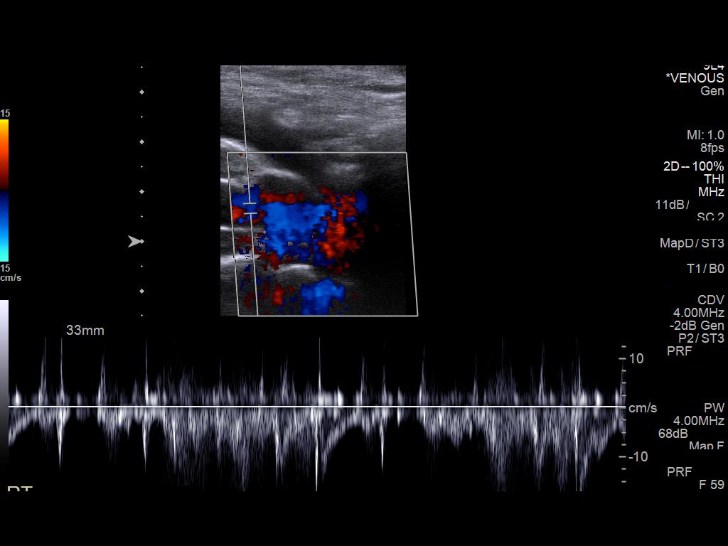
[im 6/33]
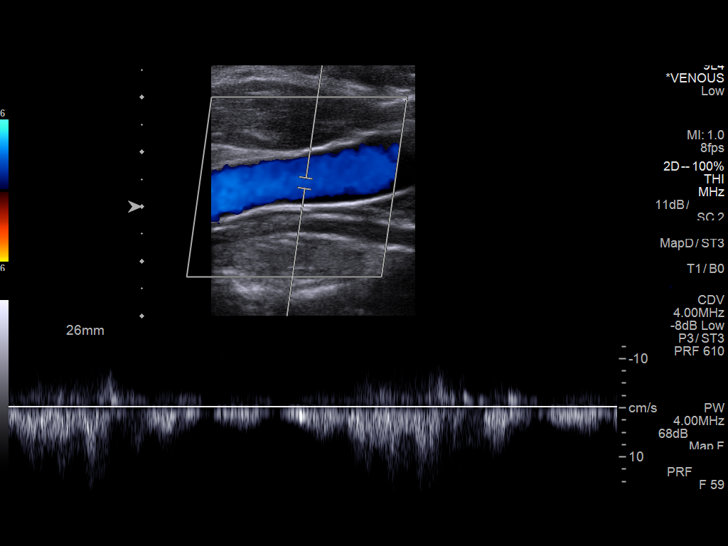
[im 9/33]
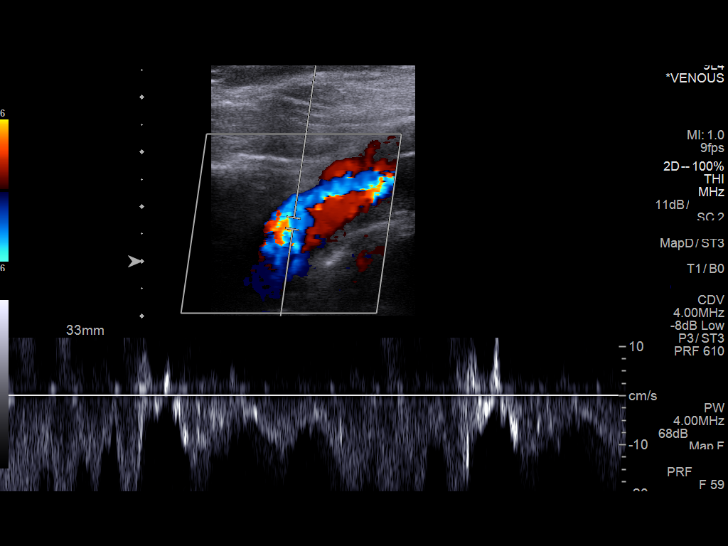
[im 12/33]
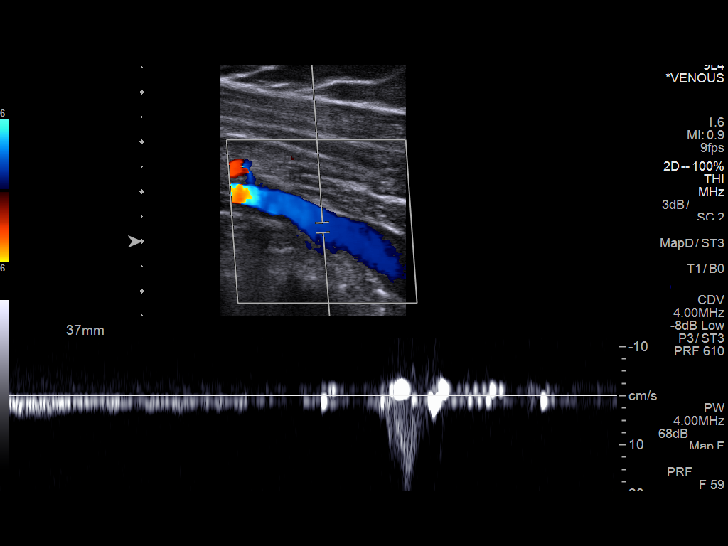
[im 14/33]
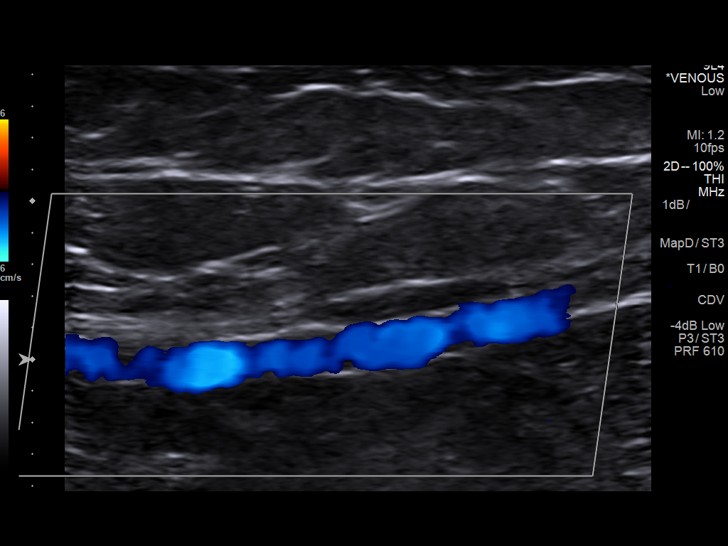
[im 17/33]
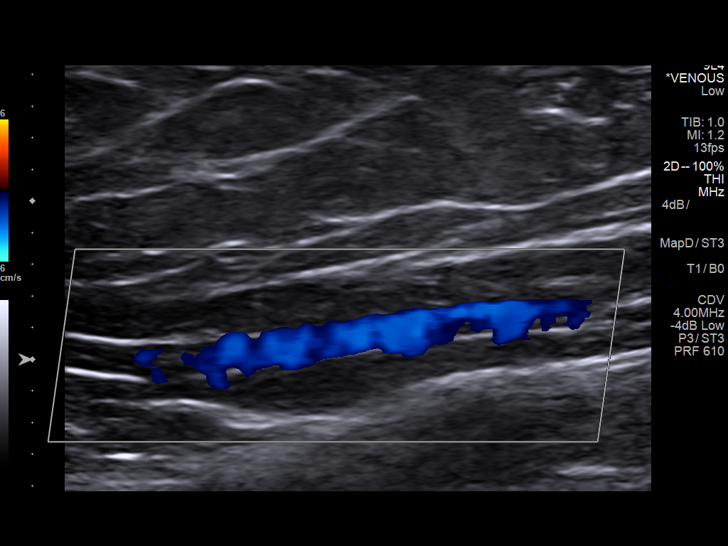
[im 19/33]
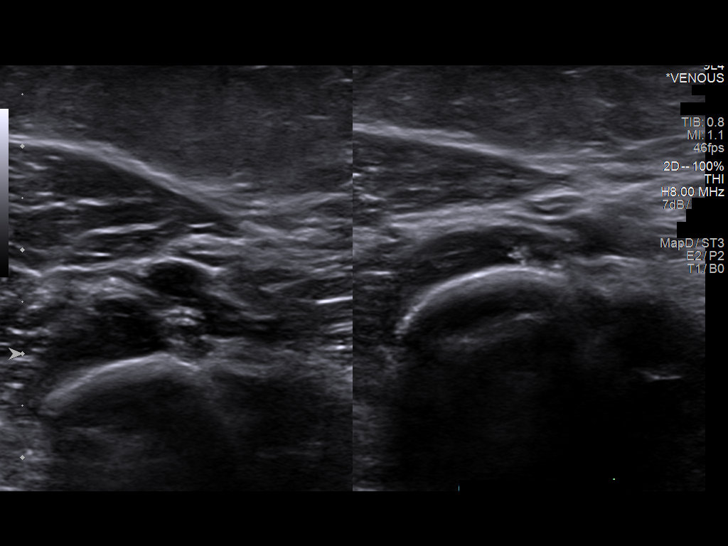
[im 21/33]
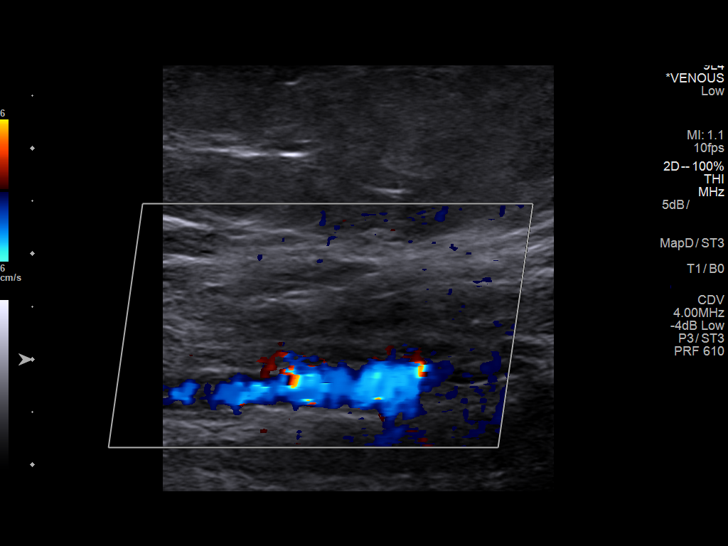
[im 24/33]
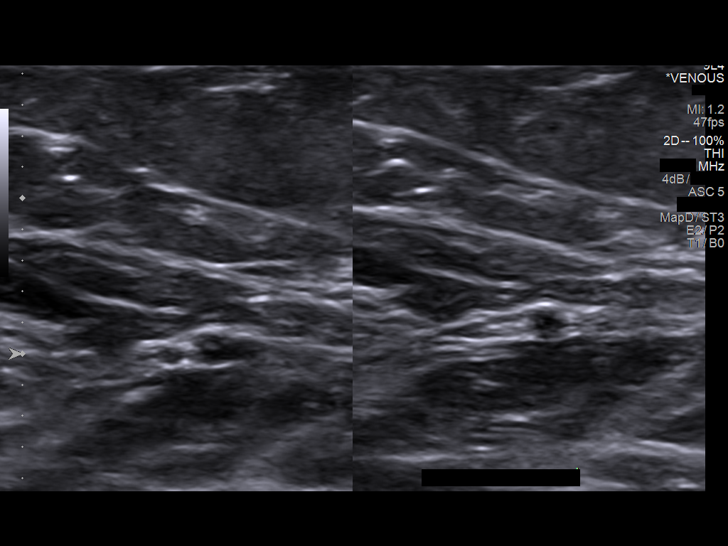
[im 27/33]
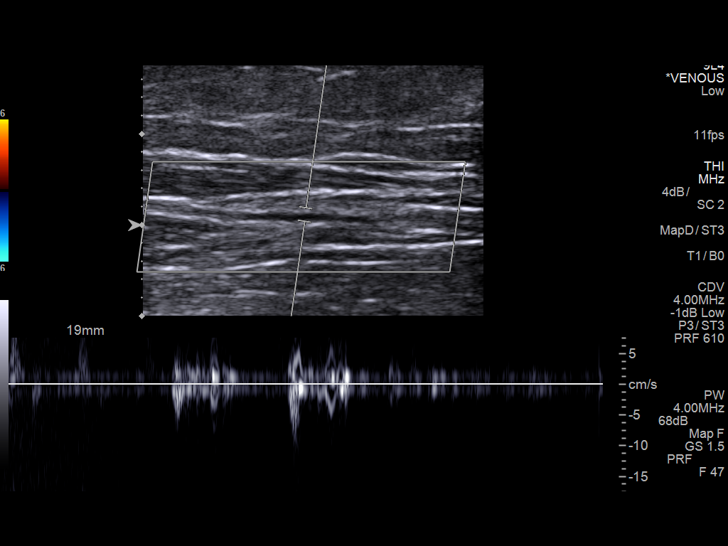
[im 30/33]
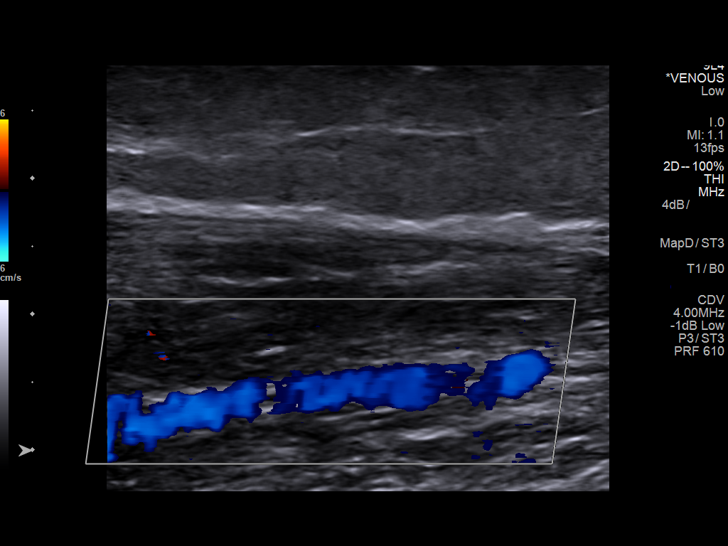
[im 33/33]
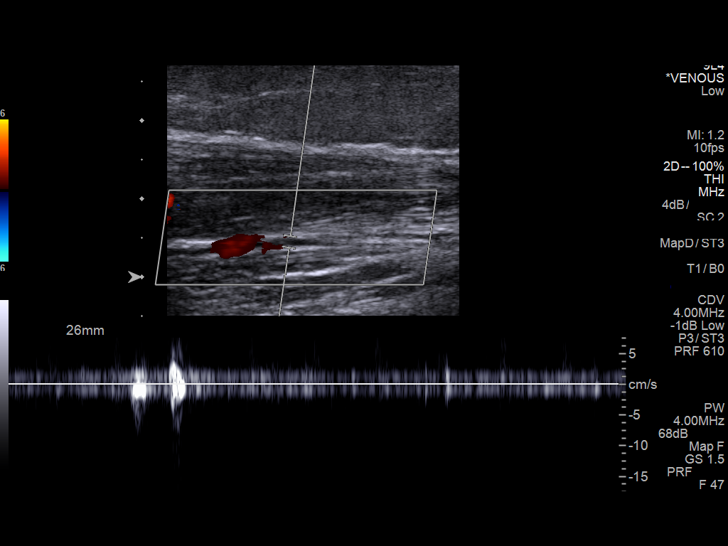

[13 of 24 positions shown; findings below may reference images not displayed]

FINDINGS: Contralateral Subclavian Vein: Respiratory phasicity is normal and
symmetric with the symptomatic side. No evidence of thrombus. Normal
compressibility.

Internal Jugular Vein: No evidence of thrombus. Normal
compressibility, respiratory phasicity and response to augmentation.

Subclavian Vein: No evidence of thrombus. Normal compressibility,
respiratory phasicity and response to augmentation.

Axillary Vein: No evidence of thrombus. Normal compressibility,
respiratory phasicity and response to augmentation.

Cephalic Vein: No evidence of thrombus. Normal compressibility,
respiratory phasicity and response to augmentation.

Basilic Vein: No evidence of thrombus. Normal compressibility,
respiratory phasicity and response to augmentation.

Brachial Veins: No evidence of thrombus. Normal compressibility,
respiratory phasicity and response to augmentation.

Radial Veins: No evidence of thrombus. Normal compressibility,
respiratory phasicity and response to augmentation.

Ulnar Veins: No evidence of thrombus. Normal compressibility,
respiratory phasicity and response to augmentation.

Venous Reflux:  None visualized.

Other Findings:  None visualized.
IMPRESSION: No evidence of DVT within the left upper extremity.

## 2019-12-22 ENCOUNTER — Encounter: Payer: Self-pay | Admitting: Neurology

## 2019-12-22 ENCOUNTER — Other Ambulatory Visit: Payer: Self-pay

## 2019-12-22 ENCOUNTER — Ambulatory Visit (INDEPENDENT_AMBULATORY_CARE_PROVIDER_SITE_OTHER): Payer: Medicare HMO | Admitting: Neurology

## 2019-12-22 VITALS — BP 152/97 | HR 79 | Ht 65.0 in | Wt 223.0 lb

## 2019-12-22 DIAGNOSIS — M545 Low back pain, unspecified: Secondary | ICD-10-CM

## 2019-12-22 DIAGNOSIS — G43009 Migraine without aura, not intractable, without status migrainosus: Secondary | ICD-10-CM | POA: Diagnosis not present

## 2019-12-22 DIAGNOSIS — M5432 Sciatica, left side: Secondary | ICD-10-CM | POA: Diagnosis not present

## 2019-12-22 DIAGNOSIS — M5431 Sciatica, right side: Secondary | ICD-10-CM | POA: Diagnosis not present

## 2019-12-22 DIAGNOSIS — G8929 Other chronic pain: Secondary | ICD-10-CM

## 2019-12-22 MED ORDER — SUCRALFATE 1 GM/10ML PO SUSP: 1.0000 g | Freq: Three times a day (TID) | ORAL | 0 refills | Status: DC

## 2019-12-22 MED ORDER — BUTALBITAL-APAP-CAFFEINE 50-325-40 MG PO TABS
ORAL_TABLET | ORAL | 3 refills | Status: DC
Start: 2019-12-22 — End: 2020-09-07

## 2019-12-22 MED ORDER — SUCRALFATE 1 GM/10ML PO SUSP: 1.0000 g | Freq: Three times a day (TID) | ORAL | 1 refills | Status: DC

## 2019-12-22 MED ORDER — FLUCONAZOLE 150 MG PO TABS: 150.0000 mg | ORAL_TABLET | Freq: Every day | ORAL | 0 refills | Status: DC

## 2019-12-22 MED ORDER — OXYCODONE-ACETAMINOPHEN 10-325 MG PO TABS: 1.0000 | ORAL_TABLET | Freq: Four times a day (QID) | ORAL | 0 refills | Status: DC | PRN

## 2019-12-22 NOTE — Addendum Note (Signed)
Addended by: Kelle Darting A on: 12/22/2019 03:59 PM   Modules accepted: Orders

## 2019-12-22 NOTE — Progress Notes (Signed)
t  [facility]  PATIENT: Kimberly Mann DOB: 1955/07/13  REFERRING CLINICIAN: [name]  HISTORY FROM: Patient  REASON FOR VISIT: Left shoulder and neck pain; back pain [  HISTORICAL  CHIEF COMPLAINT:  Chief Complaint  Patient presents with  . Follow-up    RM 12, alone. Last seen 08/25/19. Here to f/u on vertigo/back and neck pain. Having increased pain, requesting inj today. She had a bone density test a few weeks ago that showed arthritis in her spine.   . Medication Refill    Requesting refill on diflucan    HISTORY OF PRESENT ILLNESS:  Kimberly Mann is a 64 y.o. woman with neck, back, hip and shoulder pain.  Update 12/22/2019: She is reporting more pain in the lower lumbar spine.  Pain worsens with standing or walking.  She has mild neck pain but it is better than it was at the last visit.  The shoulders state much better after the bursa injections at the last visit.    She had a bone density test showing osteopenia.   She does get steroid injection 3 times a year (usually 80 mg Depo-Medrol) and a steroid pack a few times a year.    The shots help her more than the steroid packs.     She is experiencing more migraine headaches.  These are now occurring about 2 to 3 days a week.  Imitrex has not worked in the past.  Fioricet has helped her some in the past but she has not taken for a couple years.  She had chest pain but workup was fine and GERD/esophageal spasm was fel tto be more likely.   She felt better with sucralfate.    Update 08/25/2019: She is feeling nauseous and has had more vertigo the past week.   She has vomited a few times.   Her vertigo occurs when she rolls over or changes position.  It does not completely resolve when she is sitting still, however, she feels she is spinning.    Looking at a bright light increases symptoms.   She had similar symptoms last year and a Z-pack helped.     Her mom died a few weeks ago.   She is feeling more  depressed  She notes pain in her shoulders, hips and spine.   TPIs and joint/bursa injections usually help her for a month or two.   Currently, the worst pain is in the right shoulder and then both hips.  Pain is worse with use.  Update 04/08/2019: She reports having an allergic reactions to an antibiotic ointment she used after a skin tag was removed from her face.  She is reporting more pain in her hips.  Oxycodone helps some.    Injections have helped.    Pain is bilateral in the hips and shoulders as before....she was better for about 3 months after the last series of injections and having more pain the past month.    She also has piriformis and lumbar paraspinal pain.    All pain is worse when she uses the limbs more.    She did get some steroids with the recent antibiotic reactio  Her vertigo is about the same.    There is some positional element though she experiences some vertigo even at rest.    She has seen ENT and has done vestibular therapy.  Update 12/02/2018: She reports the worse of her pain is in the shoulders and hips.   Shoulder pain is worse  with movements.  In the past, subacromial injections help the shoulder pain and trochanteric bursa injections have helped the hip pan.  She also has more vertigo.   She feels a spinning sensation when she looks up or changes position.   She has trouble walking when this is happening.  She has fallen out of her bed.   She has had some symptoms x 15 years, but this is worse recently.   She did vestibular therapy in the past.   Sometimes she has left ear pain.   She has seen ENT and was told she has peripheral vertigo.     Update 07/29/2018: She reports that she had some benefit after the trochanteric and subacromial bursa injection from the December 2019 visit.  However, about a month ago over the benefit was worn off.  She currently has pain in both hips and also pain in the right shoulder.  The pain in the right shoulder worsens when she raises  her arm over her head.  She does not note weakness.  The pain in the hips is worse with walking.  She gets some benefit from Percocet.  Meloxicam has helped better than ibuprofen and she would like to get back on that.  She also uses the diclofenac gel over the areas that hurt the most.  Neck pain is doing about the same.  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  The neck pain is worse with movements.    Update 04/16/2018: She is reporting pain in the hips, over the trochanteric bursa.   Pain increases if she walks more.   In the past trochanteric bursae injections have helped a while.   She also notes new pain in her feet, especially the 4th and 5th digits.     She also has some pain in the lower back but this is not as bad as the hip pain.  She also is reporting pain in her right shoulder.   Pain increases with rotation some.   She also has neck pain and known C3-C6 DJD   She has breast cancer,  She is done with RadRx and still is on tamoxifen.    Her chest pain is better.     Update 12/11/2017: She had recent reconstructive surgery after breast cancer and is more sore in general (Dr. Doreen Salvage;  Also sees Dr. [facility]).   She is on tamoxifen and did RadRx.      She has pain everywhere.   She has pain in both hips and that is her worse pain.   Trochanteric bursa injections have helped in the past.   Due to her recent surgery, she would prefer to hold off on any injections today.     She also notes neck pain, worse with movements.   She has multilevel DJD C3-C6.       Update 08/05/2017: She is reporting more hip pain, bilaterally.    Pain increases if she lays on that side.  Trochanteric injections ususally help for a few months.    She also has back pain.  She has milder neck pain, worse with tirning he head.   Shoulders are only mildly tender  Update 06/04/2017: Neck, back and shoulder pain improved after the trigger point injections in October but have returned over the last month.       MRI cervical spine showed DJD at C3C4-C5C6.    Her worse pain is in the left shoulder.   Pain increases if she  lays on that side.    She also has right shoulder region pain.   She also has neck pain that increases as the day goes on.   Her LB is hurting but not as bad as the neck and shoulders.   Hipas are hurting a lot over the bursae.    Bursa injections have helped in the past.    She sees Dr. Harlow Asa for breast cancer.   She had surgery and radiation and will be starting a pill x 5 days  Update 01/30/2017:    Since the last visit, her neck pain has greatly increased. She is undergoing radiation for breast cancer and she notes that laying down on the table is painful for her.   The pain is in the left neck more than the right. Additionally she is noticing more lower back pain also little worse on the left than the right. She denies any change in strength or sensation in the arms or legs. There is no change in her bladder.    Her hip and shoulder pain improved quite a bit after the joint injections at her last visit. ________________________________ From 01/30/2017 LBP/hip pain:   She reports bilateral lower back pain and pain that is in the buttock and hip region, generally worse on the right.   Pain does not radiate into her legs. Pain is worse with walking and with standing up from a seated position.  In the past, trochanteric bursa injection has helped.   Neck/Shoulder pain:   She reports pain in the neck and shoulders, worse on her right in the neck but in the left > right shulder.    Pain is worse with arms elevated,    Her pain also increases with external rotation and elevation.  Subacromial bursa injection often helped in the past.   However, she has had some itching following the steroid shots and it was especially bad after the last one 3-4 months ago.    ROM is mildly reduced in shoulders.    No arm weakness.   Breast cancer:   She needed surgery x 2 and is currently on RadRx for breast  cancer.       Med's:  For her pain she is on Percocet 4 times a day with benefit.  Sheis also on meloxicam and tizanidine.   She has not shown any drug-seeking behavior       Migraine headaches occur about once a week. If Percocet does not help the migraine she will take a meloxicam.    REVIEW OF SYSTEMS:  Constitutional: No fevers, chills, sweats, or change in appetite Eyes: No visual changes, double vision, eye pain Ear, nose and throat: No hearing loss, ear pain, nasal congestion, sore throat Cardiovascular: No chest pain, palpitations Respiratory:  No shortness of breath at rest or with exertion.   No wheezes GastrointestinaI: No nausea, vomiting, diarrhea, abdominal pain, fecal incontinence Genitourinary:  No dysuria, urinary retention or frequency.  No nocturia. Musculoskeletal: as above Integumentary: No rash, pruritus, skin lesions Neurological: as above Psychiatric: She has som depression since breast cancer dx.  Endocrine: No palpitations, diaphoresis, change in appetite, change in weigh or increased thirst  ALLERGIES: Allergies  Allergen Reactions  . Morphine And Related Nausea And Vomiting and Swelling  . Penicillins   . Sulfa Antibiotics     HOME MEDICATIONS: Outpatient Medications Prior to Visit  Medication Sig Dispense Refill  . ALPRAZolam (XANAX) 0.5 MG tablet Take one or two pills before the [facility]  2 tablet 0  . diclofenac sodium (VOLTAREN) 1 % GEL Apply 2 g topically 4 (four) times daily as needed. 200 g 5  . fluticasone (FLONASE) 50 MCG/ACT nasal spray Place 2 sprays into both nostrils daily. 18.2 mL 3  . hydrOXYzine (VISTARIL) 25 MG capsule Take 1 capsule (25 mg total) by mouth 3 (three) times daily as needed. 60 capsule 1  . levocetirizine (XYZAL) 5 MG tablet Take 1 tablet (5 mg total) by mouth every evening. 90 tablet 2  . meloxicam (MOBIC) 15 MG tablet Take 1 tablet (15 mg total) by mouth as needed. 90 tablet 1  . montelukast (SINGULAIR) 10 MG tablet Take 1  tablet (10 mg total) by mouth at bedtime. 30 tablet 3  . Multiple Vitamins-Minerals (MULTIVITAMIN ADULT PO)     . pantoprazole (PROTONIX) 40 MG tablet Take 1 tablet (40 mg total) by mouth daily. 30 tablet 3  . potassium chloride (KLOR-CON) 10 MEQ tablet Take 1 tablet by mouth twice daily for 7 days 14 tablet 0  . promethazine (PHENERGAN) 25 MG tablet 25 mg as needed.     . tamoxifen (NOLVADEX) 20 MG tablet Take 20 mg by mouth daily.    Marland Kitchen triamterene-hydrochlorothiazide (MAXZIDE-25) 37.5-25 MG tablet Take 0.5 tablets by mouth daily. 90 tablet 2  . Vitamin D, Ergocalciferol, (DRISDOL) 1.25 MG (50000 UNIT) CAPS capsule Take 1 capsule (50,000 Units total) by mouth every 7 (seven) days. 4 capsule 3  . fluconazole (DIFLUCAN) 150 MG tablet Take 1 tablet (150 mg total) by mouth daily. 15 tablet 0  . oxyCODONE-acetaminophen (PERCOCET) 10-325 MG tablet Take 1 tablet by mouth every 6 (six) hours as needed for pain. 120 tablet 0  . sucralfate (CARAFATE) 1 GM/10ML suspension Take 10 mLs (1 g total) by mouth 4 (four) times daily -  with meals and at bedtime. 420 mL 0   No facility-administered medications prior to visit.    PAST MEDICAL HISTORY: Past Medical History:  Diagnosis Date  . Anxiety   . Cancer (HCC)    breast  . Chronic back pain    takes oxycodone  . Complication of anesthesia   . Eczema   . Environmental allergies   . Headache   . Hypertension   . PONV (postoperative nausea and vomiting)   . Vertigo     PAST SURGICAL HISTORY: Past Surgical History:  Procedure Laterality Date  . BREAST REDUCTION SURGERY Left 11/20/2017   Procedure: left breast reduction for symmetry and liposuction bilateral lateral breasts;  Surgeon: Wallace Going, DO;  Location: [facility];  Service: Plastics;  Laterality: Left;  . BREAST SURGERY     rt breast lumpectomy  . LIPOSUCTION    . TUBAL LIGATION      FAMILY HISTORY: Family History  Problem Relation Age of Onset  .  Hypertension Mother     SOCIAL HISTORY:  Social History   Socioeconomic History  . Marital status: Legally Separated    Spouse name: Not on file  . Number of children: Not on file  . Years of education: Not on file  . Highest education level: Not on file  Occupational History  . Not on file  Tobacco Use  . Smoking status: Former Smoker    Quit date: 05/17/1974    Years since quitting: 45.6  . Smokeless tobacco: Never Used  Vaping Use  . Vaping Use: Never used  Substance and Sexual Activity  . Alcohol use: Yes    Alcohol/week: 7.0 standard  drinks    Types: 7 Standard drinks or equivalent per week    Comment: one glass of wine with dinner/fim  . Drug use: No    Types: Marijuana  . Sexual activity: Not on file  Other Topics Concern  . Not on file  Social History Narrative  . Not on file   Social Determinants of Health   Financial Resource Strain:   . Difficulty of Paying Living Expenses: Not on file  Food Insecurity:   . Worried About Charity fundraiser in the Last Year: Not on file  . Ran Out of Food in the Last Year: Not on file  Transportation Needs:   . Lack of Transportation (Medical): Not on file  . Lack of Transportation (Non-Medical): Not on file  Physical Activity:   . Days of Exercise per Week: Not on file  . Minutes of Exercise per Session: Not on file  Stress:   . Feeling of Stress : Not on file  Social Connections:   . Frequency of Communication with Friends and Family: Not on file  . Frequency of Social Gatherings with Friends and Family: Not on file  . Attends Religious Services: Not on file  . Active Member of Clubs or Organizations: Not on file  . Attends Archivist Meetings: Not on file  . Marital Status: Not on file  Intimate Partner Violence:   . Fear of Current or Ex-Partner: Not on file  . Emotionally Abused: Not on file  . Physically Abused: Not on file  . Sexually Abused: Not on file     PHYSICAL EXAM  Vitals:    12/22/19 0947  BP: (!) 152/97  Pulse: 79  Weight: 223 lb (101.2 kg)  Height: 5\' 5"  (1.651 m)    Body mass index is 37.11 kg/m.   General: The patient is well-developed and well-nourished and in no acute distress   Musculoskeletal:    Neck pain is minimal and range of motion is reasonable in the neck.  She does not have significant pain over the subacromial bursa today.  There is moderate tenderness over the lower lumbar paraspinal muscles..    Neurologic Exam  Mental status: The patient is alert and oriented x 3 at the time of the examination. The patient has apparent normal recent and remote memory, with an apparently normal attention span and concentration ability.    Cranial nerves:    Facial strength is normal.  Trapezius strength is normal.  No dysarthria is noted.    No obvious hearing deficits are noted.  Motor:  Muscle bulk and tone are normal. Strength is 5/5 in the arms or legs.   Gait and station: Station is normal.  She has an arthritic gait.  Tandem gait is mildly wide.  DTRs:   Reflexes are normal and symmetric in the arms and legs.  Maneuvers: The Dix-Hallpike maneuver did not evoke any nystagmus.     ASSESSMENT AND PLAN   1. Chronic midline low back pain without sciatica   2. Back pain, lumbosacral   3. Bilateral sciatica   4. Migraine without aura and without status migrainosus, not intractable     1.   Trigger point injections into the L3-L5 lumbar paraspinal muscles bilaterally with 80 mg Depo-Medrol in 5 cc Marcaine using sterile technique.  She tolerated the injections well and pain was better afterwards. 2.   In the past, Fioricet has helped her headaches.  I will call in a few but advised her not  to take more than 1 a day as she is also on oxycodone 3.   Continue Percocet.  The [place] controlled substance database was reviewed.  4.   She will return to see me in 4 months, sooner if she  has new or worsening neurologic symptoms.    Lashaye Fisk A.  Felecia Shelling, MD, PhD 6/38/7564, 3:32 PM Certified in Neurology, Clinical Neurophysiology, Sleep Medicine, Pain Medicine and Neuroimaging  [facility] [address] [place], [place] [address] [phone]

## 2019-12-22 NOTE — Progress Notes (Signed)
Faxed printed/signed rx sucralfate 1gm/2ml suspension #458ml, 0 refills to [facility] at [phone]. Received fax confirmation.

## 2019-12-23 ENCOUNTER — Other Ambulatory Visit (INDEPENDENT_AMBULATORY_CARE_PROVIDER_SITE_OTHER): Payer: Medicare HMO

## 2019-12-23 ENCOUNTER — Other Ambulatory Visit: Payer: Self-pay

## 2019-12-23 DIAGNOSIS — E876 Hypokalemia: Secondary | ICD-10-CM | POA: Diagnosis not present

## 2019-12-23 LAB — BASIC METABOLIC PANEL
BUN: 17 mg/dL (ref 7–25)
CO2: 24 mmol/L (ref 20–32)
Calcium: 9.2 mg/dL (ref 8.6–10.4)
Chloride: 106 mmol/L (ref 98–110)
Creat: 0.95 mg/dL (ref 0.50–0.99)
Glucose, Bld: 105 mg/dL — ABNORMAL HIGH (ref 65–99)
Potassium: 3.8 mmol/L (ref 3.5–5.3)
Sodium: 141 mmol/L (ref 135–146)

## 2019-12-28 ENCOUNTER — Other Ambulatory Visit: Payer: Self-pay | Admitting: Family Medicine

## 2019-12-28 MED ORDER — OMEPRAZOLE 40 MG PO CPDR: 40.0000 mg | DELAYED_RELEASE_CAPSULE | Freq: Every day | ORAL | 1 refills | Status: DC

## 2020-01-20 ENCOUNTER — Other Ambulatory Visit: Payer: Self-pay | Admitting: Neurology

## 2020-01-20 DIAGNOSIS — M545 Low back pain, unspecified: Secondary | ICD-10-CM

## 2020-01-20 MED ORDER — OXYCODONE-ACETAMINOPHEN 10-325 MG PO TABS: 1.0000 | ORAL_TABLET | Freq: Four times a day (QID) | ORAL | 0 refills | Status: DC | PRN

## 2020-01-20 NOTE — Telephone Encounter (Signed)
Called pt back. Advised Dr. Felecia Shelling out, I will forward request to Dr. Rexene Alberts, the covering MD. She verbalized understanding.

## 2020-01-20 NOTE — Telephone Encounter (Signed)
Pt called wanting to know if her oxyCODONE-acetaminophen (PERCOCET) 10-325 MG tablet can be called in for her to the [facility] on [address] in [place] since we are closed tomorrow and her Suzie Portela is "slow" Please advise.

## 2020-02-08 DIAGNOSIS — Z853 Personal history of malignant neoplasm of breast: Secondary | ICD-10-CM | POA: Diagnosis not present

## 2020-02-08 DIAGNOSIS — Z08 Encounter for follow-up examination after completed treatment for malignant neoplasm: Secondary | ICD-10-CM | POA: Diagnosis not present

## 2020-02-08 DIAGNOSIS — Z5181 Encounter for therapeutic drug level monitoring: Secondary | ICD-10-CM | POA: Diagnosis not present

## 2020-02-08 DIAGNOSIS — Z170 Estrogen receptor positive status [ER+]: Secondary | ICD-10-CM | POA: Diagnosis not present

## 2020-02-08 DIAGNOSIS — C50511 Malignant neoplasm of lower-outer quadrant of right female breast: Secondary | ICD-10-CM | POA: Diagnosis not present

## 2020-02-08 DIAGNOSIS — Z79810 Long term (current) use of selective estrogen receptor modulators (SERMs): Secondary | ICD-10-CM | POA: Diagnosis not present

## 2020-02-21 ENCOUNTER — Other Ambulatory Visit: Payer: Self-pay | Admitting: Neurology

## 2020-02-21 DIAGNOSIS — M545 Low back pain, unspecified: Secondary | ICD-10-CM

## 2020-02-21 MED ORDER — OXYCODONE-ACETAMINOPHEN 10-325 MG PO TABS: 1.0000 | ORAL_TABLET | Freq: Four times a day (QID) | ORAL | 0 refills | Status: DC | PRN

## 2020-02-21 NOTE — Telephone Encounter (Signed)
Pt is needing a refill on her oxyCODONE-acetaminophen (PERCOCET) 10-325 MG tablet sent in to the [facility] on [address] 

## 2020-03-20 ENCOUNTER — Other Ambulatory Visit: Payer: Self-pay | Admitting: Neurology

## 2020-03-20 DIAGNOSIS — M545 Low back pain, unspecified: Secondary | ICD-10-CM

## 2020-03-20 MED ORDER — OXYCODONE-ACETAMINOPHEN 10-325 MG PO TABS: 1.0000 | ORAL_TABLET | Freq: Four times a day (QID) | ORAL | 0 refills | Status: DC | PRN

## 2020-03-20 NOTE — Telephone Encounter (Signed)
Pt called needing a refill on her oxyCODONE-acetaminophen (PERCOCET) 10-325 MG tablet sent in to the [facility] on [address] In [facility]

## 2020-04-04 ENCOUNTER — Encounter: Payer: Self-pay | Admitting: Family Medicine

## 2020-04-04 ENCOUNTER — Ambulatory Visit (INDEPENDENT_AMBULATORY_CARE_PROVIDER_SITE_OTHER): Payer: Medicare HMO | Admitting: Family Medicine

## 2020-04-04 ENCOUNTER — Other Ambulatory Visit: Payer: Self-pay

## 2020-04-04 ENCOUNTER — Other Ambulatory Visit: Payer: Self-pay | Admitting: Family Medicine

## 2020-04-04 VITALS — BP 134/78 | HR 96 | Temp 98.0°F | Ht 65.0 in | Wt 227.4 lb

## 2020-04-04 DIAGNOSIS — E559 Vitamin D deficiency, unspecified: Secondary | ICD-10-CM

## 2020-04-04 DIAGNOSIS — R42 Dizziness and giddiness: Secondary | ICD-10-CM | POA: Diagnosis not present

## 2020-04-04 DIAGNOSIS — I10 Essential (primary) hypertension: Secondary | ICD-10-CM | POA: Diagnosis not present

## 2020-04-04 DIAGNOSIS — E1169 Type 2 diabetes mellitus with other specified complication: Secondary | ICD-10-CM

## 2020-04-04 DIAGNOSIS — R519 Headache, unspecified: Secondary | ICD-10-CM

## 2020-04-04 DIAGNOSIS — E785 Hyperlipidemia, unspecified: Secondary | ICD-10-CM

## 2020-04-04 MED ORDER — ALPRAZOLAM 0.5 MG PO TABS: ORAL_TABLET | ORAL | 0 refills | Status: DC

## 2020-04-04 NOTE — Progress Notes (Signed)
Chief Complaint  Patient presents with  . Pain    head pain    Kimberly Mann is a 64 y.o. female here for evaluation of occipital headache.  Duration: several months  Fell in shower and hit back of head on hollow cabinet.  Palliation: INCS, Aleve, Xyzal Provocation: none Severity: 6/10 Quality: pounding. Associated symptoms: neck pain, dizziness, photophobia. Denies: Gum chewing, jaw pain, injury, nausea, vomiting, sonophobia, diplopia, tinnitus, nasal congestion, rhinorrhea Currently with headache? Yes Failed therapies: Flonase, Xyzal, Aleve  Past Medical History:  Diagnosis Date  . Anxiety   . Cancer (HCC)    breast  . Chronic back pain    takes oxycodone  . Complication of anesthesia   . Eczema   . Environmental allergies   . Headache   . Hypertension   . PONV (postoperative nausea and vomiting)   . Vertigo    Family History  Problem Relation Age of Onset  . Hypertension Mother    Current Meds  Medication Sig  . butalbital-acetaminophen-caffeine (FIORICET) 50-325-40 MG tablet Take one po qd prn headache  . diclofenac sodium (VOLTAREN) 1 % GEL Apply 2 g topically 4 (four) times daily as needed.  . fluconazole (DIFLUCAN) 150 MG tablet Take 1 tablet (150 mg total) by mouth daily.  . fluticasone (FLONASE) 50 MCG/ACT nasal spray Place 2 sprays into both nostrils daily.  . hydrOXYzine (VISTARIL) 25 MG capsule Take 1 capsule (25 mg total) by mouth 3 (three) times daily as needed.  Marland Kitchen levocetirizine (XYZAL) 5 MG tablet Take 1 tablet (5 mg total) by mouth every evening.  . montelukast (SINGULAIR) 10 MG tablet Take 1 tablet (10 mg total) by mouth at bedtime.  . Multiple Vitamins-Minerals (MULTIVITAMIN ADULT PO)   . omeprazole (PRILOSEC) 40 MG capsule Take 1 capsule (40 mg total) by mouth daily.  Marland Kitchen oxyCODONE-acetaminophen (PERCOCET) 10-325 MG tablet Take 1 tablet by mouth every 6 (six) hours as needed for pain.  . pantoprazole (PROTONIX) 40 MG tablet Take 1 tablet (40 mg  total) by mouth daily.  . potassium chloride (KLOR-CON) 10 MEQ tablet Take 1 tablet by mouth twice daily for 7 days  . promethazine (PHENERGAN) 25 MG tablet 25 mg as needed.   . sucralfate (CARAFATE) 1 GM/10ML suspension Take 10 mLs (1 g total) by mouth 4 (four) times daily -  with meals and at bedtime.  . tamoxifen (NOLVADEX) 20 MG tablet Take 20 mg by mouth daily.  Marland Kitchen triamterene-hydrochlorothiazide (MAXZIDE-25) 37.5-25 MG tablet Take 0.5 tablets by mouth daily.  . Vitamin D, Ergocalciferol, (DRISDOL) 1.25 MG (50000 UNIT) CAPS capsule Take 1 capsule (50,000 Units total) by mouth every 7 (seven) days.  . [DISCONTINUED] ALPRAZolam (XANAX) 0.5 MG tablet Take one or two pills before the [facility]  . [DISCONTINUED] meloxicam (MOBIC) 15 MG tablet Take 1 tablet (15 mg total) by mouth as needed.    BP 134/78 (BP Location: Right Arm, Patient Position: Sitting, Cuff Size: Large)   Pulse 96   Temp 98 F (36.7 C) (Oral)   Ht 5\' 5"  (1.651 m)   Wt 227 lb 6 oz (103.1 kg)   SpO2 98%   BMI 37.84 kg/m  General: awake, alert, appearing stated age Eyes: PERRLA, EOMi Heart: RRR, no murmurs, no bruits Lungs: CTAB, no accessory muscle use Neuro: CN 2-12 intact, no cerebellar signs, DTR's equal and symmetry, no clonus MSK: 5/5 strength throughout, normal gait, + TTP over posterior cervical triangle and prox paraspinal cervical musculature Psych: Age appropriate judgment and insight,  mood and affect normal  Dizziness - Plan: TSH  Occipital headache  Essential hypertension - Plan: Comprehensive metabolic panel  Hyperlipidemia associated with type 2 diabetes mellitus ([place]) - Plan: Lipid panel  Vitamin D insufficiency - Plan: VITAMIN D 25 Hydroxy (Vit-D Deficiency, Fractures)  Will ck imaging to r/o anything sinister given new onset with neuro s/s's in elderly pt. Could be tension with neck pain. Stretches/exercises provided.  Follow up in 4 weeks. The patient voiced understanding and agreement to the  plan.  [place], [place] 4:32 PM 04/04/20

## 2020-04-04 NOTE — Telephone Encounter (Signed)
Needs to have refilled before imaging done Also she wants her referral for MRI to go to the [facility] Dr. Wallace Cullens in [facility]

## 2020-04-04 NOTE — Patient Instructions (Addendum)
If you don't hear anything about your scan in the next week, please reach out to me.  Heat (pad or rice pillow in microwave) over affected area, 10-15 minutes twice daily.   Give [place] 2-3 business days to get the results of your labs back.   OK to take Tylenol 1000 mg (2 extra strength tabs) or 975 mg (3 regular strength tabs) every 6 hours as needed.  EXERCISES RANGE OF MOTION (ROM) AND STRETCHING EXERCISES  These exercises may help you when beginning to rehabilitate your issue. In order to successfully resolve your symptoms, you must improve your posture. These exercises are designed to help reduce the forward-head and rounded-shoulder posture which contributes to this condition. Your symptoms may resolve with or without further involvement from your physician, physical therapist or athletic trainer. While completing these exercises, remember:   Restoring tissue flexibility helps normal motion to return to the joints. This allows healthier, less painful movement and activity.  An effective stretch should be held for at least 20 seconds, although you may need to begin with shorter hold times for comfort.  A stretch should never be painful. You should only feel a gentle lengthening or release in the stretched tissue.  Do not do any stretch or exercise that you cannot tolerate.  STRETCH- Axial Extensors  Lie on your back on the floor. You may bend your knees for comfort. Place a rolled-up hand towel or dish towel, about 2 inches in diameter, under the part of your head that makes contact with the floor.  Gently tuck your chin, as if trying to make a "double chin," until you feel a gentle stretch at the base of your head.  Hold 15-20 seconds. Repeat 2-3 times. Complete this exercise 1 time per day.   STRETCH - Axial Extension   Stand or sit on a firm surface. Assume a good posture: chest up, shoulders drawn back, abdominal muscles slightly tense, knees unlocked (if standing) and feet hip  width apart.  Slowly retract your chin so your head slides back and your chin slightly lowers. Continue to look straight ahead.  You should feel a gentle stretch in the back of your head. Be certain not to feel an aggressive stretch since this can cause headaches later.  Hold for 15-20 seconds. Repeat 2-3 times. Complete this exercise 1 time per day.  STRETCH - Cervical Side Bend   Stand or sit on a firm surface. Assume a good posture: chest up, shoulders drawn back, abdominal muscles slightly tense, knees unlocked (if standing) and feet hip width apart.  Without letting your nose or shoulders move, slowly tip your right / left ear to your shoulder until your feel a gentle stretch in the muscles on the opposite side of your neck.  Hold 15-20 seconds. Repeat 2-3 times. Complete this exercise 1-2 times per day.  STRETCH - Cervical Rotators   Stand or sit on a firm surface. Assume a good posture: chest up, shoulders drawn back, abdominal muscles slightly tense, knees unlocked (if standing) and feet hip width apart.  Keeping your eyes level with the ground, slowly turn your head until you feel a gentle stretch along the back and opposite side of your neck.  Hold 15-20 seconds. Repeat 2-3 times. Complete this exercise 1-2 times per day.  RANGE OF MOTION - Neck Circles   Stand or sit on a firm surface. Assume a good posture: chest up, shoulders drawn back, abdominal muscles slightly tense, knees unlocked (if standing) and feet  hip width apart.  Gently roll your head down and around from the back of one shoulder to the back of the other. The motion should never be forced or painful.  Repeat the motion 10-20 times, or until you feel the neck muscles relax and loosen. Repeat 2-3 times. Complete the exercise 1-2 times per day. STRENGTHENING EXERCISES - Cervical Strain and Sprain These exercises may help you when beginning to rehabilitate your injury. They may resolve your symptoms with or  without further involvement from your physician, physical therapist, or athletic trainer. While completing these exercises, remember:   Muscles can gain both the endurance and the strength needed for everyday activities through controlled exercises.  Complete these exercises as instructed by your physician, physical therapist, or athletic trainer. Progress the resistance and repetitions only as guided.  You may experience muscle soreness or fatigue, but the pain or discomfort you are trying to eliminate should never worsen during these exercises. If this pain does worsen, stop and make certain you are following the directions exactly. If the pain is still present after adjustments, discontinue the exercise until you can discuss the trouble with your clinician.  STRENGTH - Cervical Flexors, Isometric  Face a wall, standing about 6 inches away. Place a small pillow, a ball about 6-8 inches in diameter, or a folded towel between your forehead and the wall.  Slightly tuck your chin and gently push your forehead into the soft object. Push only with mild to moderate intensity, building up tension gradually. Keep your jaw and forehead relaxed.  Hold 10 to 20 seconds. Keep your breathing relaxed.  Release the tension slowly. Relax your neck muscles completely before you start the next repetition. Repeat 2-3 times. Complete this exercise 1 time per day.  STRENGTH- Cervical Lateral Flexors, Isometric   Stand about 6 inches away from a wall. Place a small pillow, a ball about 6-8 inches in diameter, or a folded towel between the side of your head and the wall.  Slightly tuck your chin and gently tilt your head into the soft object. Push only with mild to moderate intensity, building up tension gradually. Keep your jaw and forehead relaxed.  Hold 10 to 20 seconds. Keep your breathing relaxed.  Release the tension slowly. Relax your neck muscles completely before you start the next repetition. Repeat  2-3 times. Complete this exercise 1 time per day.  STRENGTH - Cervical Extensors, Isometric   Stand about 6 inches away from a wall. Place a small pillow, a ball about 6-8 inches in diameter, or a folded towel between the back of your head and the wall.  Slightly tuck your chin and gently tilt your head back into the soft object. Push only with mild to moderate intensity, building up tension gradually. Keep your jaw and forehead relaxed.  Hold 10 to 20 seconds. Keep your breathing relaxed.  Release the tension slowly. Relax your neck muscles completely before you start the next repetition. Repeat 2-3 times. Complete this exercise 1 time per day.  POSTURE AND BODY MECHANICS CONSIDERATIONS Keeping correct posture when sitting, standing or completing your activities will reduce the stress put on different body tissues, allowing injured tissues a chance to heal and limiting painful experiences. The following are general guidelines for improved posture. Your physician or physical therapist will provide you with any instructions specific to your needs. While reading these guidelines, remember:  The exercises prescribed by your provider will help you have the flexibility and strength to maintain correct postures.  The correct posture provides the optimal environment for your joints to work. All of your joints have less wear and tear when properly supported by a spine with good posture. This means you will experience a healthier, less painful body.  Correct posture must be practiced with all of your activities, especially prolonged sitting and standing. Correct posture is as important when doing repetitive low-stress activities (typing) as it is when doing a single heavy-load activity (lifting).  PROLONGED STANDING WHILE SLIGHTLY LEANING FORWARD When completing a task that requires you to lean forward while standing in one place for a long time, place either foot up on a stationary 2- to 4-inch high  object to help maintain the best posture. When both feet are on the ground, the low back tends to lose its slight inward curve. If this curve flattens (or becomes too large), then the back and your other joints will experience too much stress, fatigue more quickly, and can cause pain.   RESTING POSITIONS Consider which positions are most painful for you when choosing a resting position. If you have pain with flexion-based activities (sitting, bending, stooping, squatting), choose a position that allows you to rest in a less flexed posture. You would want to avoid curling into a fetal position on your side. If your pain worsens with extension-based activities (prolonged standing, working overhead), avoid resting in an extended position such as sleeping on your stomach. Most people will find more comfort when they rest with their spine in a more neutral position, neither too rounded nor too arched. Lying on a non-sagging bed on your side with a pillow between your knees, or on your back with a pillow under your knees will often provide some relief. Keep in mind, being in any one position for a prolonged period of time, no matter how correct your posture, can still lead to stiffness.  WALKING Walk with an upright posture. Your ears, shoulders, and hips should all line up. OFFICE WORK When working at a desk, create an environment that supports good, upright posture. Without extra support, muscles fatigue and lead to excessive strain on joints and other tissues.  CHAIR:  A chair should be able to slide under your desk when your back makes contact with the back of the chair. This allows you to work closely.  The chair's height should allow your eyes to be level with the upper part of your monitor and your hands to be slightly lower than your elbows.  Body position: ? Your feet should make contact with the floor. If this is not possible, use a foot rest. ? Keep your ears over your shoulders. This will  reduce stress on your neck and low back.

## 2020-04-05 ENCOUNTER — Other Ambulatory Visit: Payer: Self-pay | Admitting: Family Medicine

## 2020-04-05 DIAGNOSIS — E785 Hyperlipidemia, unspecified: Secondary | ICD-10-CM

## 2020-04-05 DIAGNOSIS — E1169 Type 2 diabetes mellitus with other specified complication: Secondary | ICD-10-CM

## 2020-04-05 LAB — COMPREHENSIVE METABOLIC PANEL
ALT: 13 U/L (ref 0–35)
AST: 17 U/L (ref 0–37)
Albumin: 4.3 g/dL (ref 3.5–5.2)
Alkaline Phosphatase: 48 U/L (ref 39–117)
BUN: 16 mg/dL (ref 6–23)
CO2: 27 mEq/L (ref 19–32)
Calcium: 9.4 mg/dL (ref 8.4–10.5)
Chloride: 103 mEq/L (ref 96–112)
Creatinine, Ser: 0.83 mg/dL (ref 0.40–1.20)
GFR: 73.81 mL/min (ref >60.00)
Glucose, Bld: 90 mg/dL (ref 70–99)
Potassium: 3.7 mEq/L (ref 3.5–5.1)
Sodium: 140 mEq/L (ref 135–145)
Total Bilirubin: 0.4 mg/dL (ref 0.2–1.2)
Total Protein: 7.2 g/dL (ref 6.0–8.3)

## 2020-04-05 LAB — LIPID PANEL
Cholesterol: 221 mg/dL — ABNORMAL HIGH (ref 0–200)
HDL: 47.5 mg/dL (ref >39.00)
NonHDL: 173.45
Total CHOL/HDL Ratio: 5
Triglycerides: 208 mg/dL — ABNORMAL HIGH (ref 0.0–149.0)
VLDL: 41.6 mg/dL — ABNORMAL HIGH (ref 0.0–40.0)

## 2020-04-05 LAB — VITAMIN D 25 HYDROXY (VIT D DEFICIENCY, FRACTURES): VITD: 56.2 ng/mL (ref 30.00–100.00)

## 2020-04-05 LAB — TSH: TSH: 1.22 u[IU]/mL (ref 0.35–4.50)

## 2020-04-05 LAB — LDL CHOLESTEROL, DIRECT: Direct LDL: 144 mg/dL

## 2020-04-05 NOTE — Progress Notes (Signed)
.  li

## 2020-04-17 ENCOUNTER — Other Ambulatory Visit: Payer: Self-pay | Admitting: Neurology

## 2020-04-17 DIAGNOSIS — M545 Low back pain, unspecified: Secondary | ICD-10-CM

## 2020-04-17 MED ORDER — OXYCODONE-ACETAMINOPHEN 10-325 MG PO TABS: 1.0000 | ORAL_TABLET | Freq: Four times a day (QID) | ORAL | 0 refills | Status: DC | PRN

## 2020-04-17 NOTE — Telephone Encounter (Signed)
Pt is needing a refill on her oxyCODONE-acetaminophen (PERCOCET) 10-325 MG tablet sent in to the [facility] on [address] 

## 2020-04-26 ENCOUNTER — Ambulatory Visit: Payer: Medicare HMO | Admitting: Neurology

## 2020-05-03 ENCOUNTER — Encounter: Payer: Self-pay | Admitting: Neurology

## 2020-05-03 ENCOUNTER — Ambulatory Visit (INDEPENDENT_AMBULATORY_CARE_PROVIDER_SITE_OTHER): Payer: Medicare HMO | Admitting: Neurology

## 2020-05-03 VITALS — BP 131/85 | HR 90 | Ht 65.0 in | Wt 225.5 lb

## 2020-05-03 DIAGNOSIS — M7552 Bursitis of left shoulder: Secondary | ICD-10-CM | POA: Diagnosis not present

## 2020-05-03 DIAGNOSIS — M7061 Trochanteric bursitis, right hip: Secondary | ICD-10-CM | POA: Diagnosis not present

## 2020-05-03 DIAGNOSIS — M542 Cervicalgia: Secondary | ICD-10-CM | POA: Diagnosis not present

## 2020-05-03 DIAGNOSIS — M545 Low back pain, unspecified: Secondary | ICD-10-CM | POA: Diagnosis not present

## 2020-05-03 DIAGNOSIS — M7062 Trochanteric bursitis, left hip: Secondary | ICD-10-CM

## 2020-05-03 DIAGNOSIS — M7551 Bursitis of right shoulder: Secondary | ICD-10-CM | POA: Diagnosis not present

## 2020-05-03 MED ORDER — OXYCODONE-ACETAMINOPHEN 10-325 MG PO TABS: 1.0000 | ORAL_TABLET | Freq: Four times a day (QID) | ORAL | 0 refills | Status: DC | PRN

## 2020-05-03 MED ORDER — FLUCONAZOLE 150 MG PO TABS: 150.0000 mg | ORAL_TABLET | Freq: Every day | ORAL | 0 refills | Status: DC

## 2020-05-03 NOTE — Progress Notes (Signed)
t  [facility]  PATIENT: Kimberly Mann DOB: 28-Jan-1956  REFERRING CLINICIAN: [name]  HISTORY FROM: Patient  REASON FOR VISIT: Left shoulder and neck pain; back pain [  HISTORICAL  CHIEF COMPLAINT:  Chief Complaint  Patient presents with  . Follow-up    RM 12. Last seen 12/22/2019.     HISTORY OF PRESENT ILLNESS:  Kimberly Mann is a 65 y.o. woman with neck, back, hip and shoulder pain.  Update 05/03/2020: She is reporting that her worst pain is in the hips.  In the past, she has had similar pain that improved with trochanteric bursa injections. Pain worsens with standing or walking.  She also reports bilateral shoulder pain with some neck pain.  In the past, shoulder pain has improved after subacromial bursa injections.  She had a bone density test showing osteopenia.   She does get steroid injection 3 times a year (usually 80 mg Depo-Medrol) and a steroid pack a few times a year.    The shots help her more than the steroid packs.  We discussed that we need to continue to try to limit the injections to just a couple each year  She is experiencing more migraine headaches.  These are now occurring about 2 to 3 days a week.  Imitrex has not worked in the past.  Fioricet has helped her some in the past but she has not taken for a couple years.  She has some depression   Her mother dies last year.  She has breast cancer,  She is done with RadRx and still is on tamoxifen.    She sometimes notes chest pain of the breast       MRI Imaging: MRI cervical spine 03/06/2017:  Shallow broad-based central protrusion at C4-5 slightly deforms the  cord but the central canal and foramina are open.    Mild right foraminal narrowing at C5-6 due to uncovertebral  spurring. The central canal and left foramen are open at this level.    Based on comparison with the report of the prior MRI, the appearance of the cervical spine is unchanged.   MRI brain 10/06/2019 [facility]  Imaging):  This MRI of the brain with and without contrast with added attention to the internal auditory canals shows the following:   The 8th nerves and the internal auditory canals appear normal. Scattered T2/FLAIR hyperintense foci in the subcortical and deep white matter consistent with chronic microvascular ischemic change.  None of the foci appear to be acute.  There is a normal enhancement pattern and no acute findings.     REVIEW OF SYSTEMS:  Constitutional: No fevers, chills, sweats, or change in appetite Eyes: No visual changes, double vision, eye pain Ear, nose and throat: No hearing loss, ear pain, nasal congestion, sore throat Cardiovascular: No chest pain, palpitations Respiratory:  No shortness of breath at rest or with exertion.   No wheezes GastrointestinaI: No nausea, vomiting, diarrhea, abdominal pain, fecal incontinence Genitourinary:  No dysuria, urinary retention or frequency.  No nocturia. Musculoskeletal: as above Integumentary: No rash, pruritus, skin lesions Neurological: as above Psychiatric: She has som depression since breast cancer dx.  Endocrine: No palpitations, diaphoresis, change in appetite, change in weigh or increased thirst  ALLERGIES: Allergies  Allergen Reactions  . Morphine And Related Nausea And Vomiting and Swelling  . Penicillins   . Sulfa Antibiotics     HOME MEDICATIONS: Outpatient Medications Prior to Visit  Medication Sig Dispense Refill  . ALPRAZolam (XANAX) 0.5 MG  tablet Take one or two pills before the MRi 2 tablet 0  . butalbital-acetaminophen-caffeine (FIORICET) 50-325-40 MG tablet Take one po qd prn headache 15 tablet 3  . diclofenac sodium (VOLTAREN) 1 % GEL Apply 2 g topically 4 (four) times daily as needed. 200 g 5  . fluticasone (FLONASE) 50 MCG/ACT nasal spray Place 2 sprays into both nostrils daily. 18.2 mL 3  . hydrOXYzine (VISTARIL) 25 MG capsule Take 1 capsule (25 mg total) by mouth 3 (three) times daily as needed. 60  capsule 1  . levocetirizine (XYZAL) 5 MG tablet Take 1 tablet (5 mg total) by mouth every evening. 90 tablet 2  . montelukast (SINGULAIR) 10 MG tablet Take 1 tablet (10 mg total) by mouth at bedtime. 30 tablet 3  . Multiple Vitamins-Minerals (MULTIVITAMIN ADULT PO)     . omeprazole (PRILOSEC) 40 MG capsule Take 1 capsule (40 mg total) by mouth daily. 30 capsule 1  . pantoprazole (PROTONIX) 40 MG tablet Take 1 tablet (40 mg total) by mouth daily. 30 tablet 3  . potassium chloride (KLOR-CON) 10 MEQ tablet Take 1 tablet by mouth twice daily for 7 days 14 tablet 0  . promethazine (PHENERGAN) 25 MG tablet 25 mg as needed.     . sucralfate (CARAFATE) 1 GM/10ML suspension Take 10 mLs (1 g total) by mouth 4 (four) times daily -  with meals and at bedtime. 420 mL 1  . tamoxifen (NOLVADEX) 20 MG tablet Take 20 mg by mouth daily.    Marland Kitchen triamterene-hydrochlorothiazide (MAXZIDE-25) 37.5-25 MG tablet Take 0.5 tablets by mouth daily. 90 tablet 2  . Vitamin D, Ergocalciferol, (DRISDOL) 1.25 MG (50000 UNIT) CAPS capsule Take 1 capsule (50,000 Units total) by mouth every 7 (seven) days. 4 capsule 3  . fluconazole (DIFLUCAN) 150 MG tablet Take 1 tablet (150 mg total) by mouth daily. 15 tablet 0  . oxyCODONE-acetaminophen (PERCOCET) 10-325 MG tablet Take 1 tablet by mouth every 6 (six) hours as needed for pain. 120 tablet 0   No facility-administered medications prior to visit.    PAST MEDICAL HISTORY: Past Medical History:  Diagnosis Date  . Anxiety   . Cancer (HCC)    breast  . Chronic back pain    takes oxycodone  . Complication of anesthesia   . Eczema   . Environmental allergies   . Headache   . Hypertension   . PONV (postoperative nausea and vomiting)   . Vertigo     PAST SURGICAL HISTORY: Past Surgical History:  Procedure Laterality Date  . BREAST REDUCTION SURGERY Left 11/20/2017   Procedure: left breast reduction for symmetry and liposuction bilateral lateral breasts;  Surgeon: Peggye Form, DO;  Location: [facility];  Service: Plastics;  Laterality: Left;  . BREAST SURGERY     rt breast lumpectomy  . LIPOSUCTION    . TUBAL LIGATION      FAMILY HISTORY: Family History  Problem Relation Age of Onset  . Hypertension Mother     SOCIAL HISTORY:  Social History   Socioeconomic History  . Marital status: Legally Separated    Spouse name: Not on file  . Number of children: Not on file  . Years of education: Not on file  . Highest education level: Not on file  Occupational History  . Not on file  Tobacco Use  . Smoking status: Former Smoker    Quit date: 05/17/1974    Years since quitting: 45.9  . Smokeless tobacco: Never Used  Vaping Use  . Vaping Use: Never used  Substance and Sexual Activity  . Alcohol use: Yes    Alcohol/week: 7.0 standard drinks    Types: 7 Standard drinks or equivalent per week    Comment: one glass of wine with dinner/fim  . Drug use: No    Types: Marijuana  . Sexual activity: Not on file  Other Topics Concern  . Not on file  Social History Narrative  . Not on file   Social Determinants of Health   Financial Resource Strain: Not on file  Food Insecurity: Not on file  Transportation Needs: Not on file  Physical Activity: Not on file  Stress: Not on file  Social Connections: Not on file  Intimate Partner Violence: Not on file     PHYSICAL EXAM  Vitals:   05/03/20 1057  BP: 131/85  Pulse: 90  SpO2: 98%  Weight: 225 lb 8 oz (102.3 kg)  Height: 5\' 5"  (1.651 m)    Body mass index is 37.53 kg/m.   General: The patient is well-developed and well-nourished and in no acute distress   Musculoskeletal:   She is tender over bilateral subacromial bursae and bilateral trochanteric bursae.  Neurologic Exam  Mental status: The patient is alert and oriented x 3 at the time of the examination. The patient has apparent normal recent and remote memory, with an apparently normal attention span and  concentration ability.    Cranial nerves:    Facial strength is normal.  Trapezius strength is normal.  No dysarthria is noted.    No obvious hearing deficits are noted.  Motor:  Muscle bulk and tone are normal. Strength is 5/5 in the arms or legs.   Gait and station: Station is normal.  She has an arthritic gait.  Tandem gait is mildly wide.  DTRs:   Reflexes are normal and symmetric in the arms and legs.     ASSESSMENT AND PLAN   1. Subacromial bursitis of both shoulders   2. Back pain, lumbosacral   3. Trochanteric bursitis, left hip   4. Trochanteric bursitis, right hip   5. Neck pain     1.   Injection of bilateral trochanteric bursae with 40 mg Depo-Medrol in 6 cc Marcaine using sterile technique..  She tolerated the injections well and pain was better afterwards. 2.   Injection of bilateral subacromial bursae with 40 mg Depo-Medrol in 6 cc Marcaine using sterile technique..  She tolerated the injections well and pain was better afterwards. 3.   Continue Percocet.  The [place] controlled substance database was reviewed.  4.   She will return to see me in 4 months, sooner if she  has new or worsening neurologic symptoms.    Von Quintanar A. Felecia Shelling, MD, PhD [id number], 0000000 PM Certified in Neurology, Clinical Neurophysiology, Sleep Medicine, Pain Medicine and Neuroimaging  [facility] [address] [place], [place] [phone]

## 2020-05-05 ENCOUNTER — Ambulatory Visit: Payer: Medicare HMO | Admitting: Family Medicine

## 2020-05-06 ENCOUNTER — Other Ambulatory Visit: Payer: Self-pay | Admitting: Family Medicine

## 2020-05-06 DIAGNOSIS — I10 Essential (primary) hypertension: Secondary | ICD-10-CM

## 2020-05-10 ENCOUNTER — Ambulatory Visit: Payer: Medicare HMO | Admitting: Family Medicine

## 2020-05-17 ENCOUNTER — Other Ambulatory Visit: Payer: Medicare HMO

## 2020-05-18 ENCOUNTER — Other Ambulatory Visit: Payer: Self-pay

## 2020-05-18 ENCOUNTER — Other Ambulatory Visit (INDEPENDENT_AMBULATORY_CARE_PROVIDER_SITE_OTHER): Payer: Medicare HMO

## 2020-05-18 DIAGNOSIS — E785 Hyperlipidemia, unspecified: Secondary | ICD-10-CM

## 2020-05-18 DIAGNOSIS — E1169 Type 2 diabetes mellitus with other specified complication: Secondary | ICD-10-CM | POA: Diagnosis not present

## 2020-05-18 LAB — LIPID PANEL
Cholesterol: 187 mg/dL (ref 0–200)
HDL: 46.5 mg/dL (ref >39.00)
LDL Cholesterol: 119 mg/dL — ABNORMAL HIGH (ref 0–99)
NonHDL: 140.08
Total CHOL/HDL Ratio: 4
Triglycerides: 106 mg/dL (ref 0.0–149.0)
VLDL: 21.2 mg/dL (ref 0.0–40.0)

## 2020-05-22 DIAGNOSIS — U071 COVID-19: Secondary | ICD-10-CM | POA: Diagnosis not present

## 2020-05-22 DIAGNOSIS — Z20822 Contact with and (suspected) exposure to covid-19: Secondary | ICD-10-CM | POA: Diagnosis not present

## 2020-05-26 DIAGNOSIS — U071 COVID-19: Secondary | ICD-10-CM | POA: Diagnosis not present

## 2020-06-02 ENCOUNTER — Other Ambulatory Visit: Payer: Self-pay | Admitting: Family Medicine

## 2020-06-02 DIAGNOSIS — U071 COVID-19: Secondary | ICD-10-CM | POA: Diagnosis not present

## 2020-06-14 ENCOUNTER — Other Ambulatory Visit: Payer: Self-pay | Admitting: Neurology

## 2020-06-14 DIAGNOSIS — M545 Low back pain, unspecified: Secondary | ICD-10-CM

## 2020-06-14 MED ORDER — OXYCODONE-ACETAMINOPHEN 10-325 MG PO TABS: 1.0000 | ORAL_TABLET | Freq: Four times a day (QID) | ORAL | 0 refills | Status: DC | PRN

## 2020-06-14 NOTE — Telephone Encounter (Signed)
Pt is needing a refill on her oxyCODONE-acetaminophen (PERCOCET) 10-325 MG tablet sent in to the [facility] on [address]

## 2020-06-28 ENCOUNTER — Other Ambulatory Visit: Payer: Self-pay | Admitting: Family Medicine

## 2020-06-28 MED ORDER — VITAMIN D (ERGOCALCIFEROL) 1.25 MG (50000 UNIT) PO CAPS: 50000.0000 [IU] | ORAL_CAPSULE | ORAL | 1 refills | Status: DC

## 2020-07-12 ENCOUNTER — Other Ambulatory Visit: Payer: Self-pay | Admitting: Neurology

## 2020-07-12 DIAGNOSIS — M545 Low back pain, unspecified: Secondary | ICD-10-CM

## 2020-07-12 MED ORDER — OXYCODONE-ACETAMINOPHEN 10-325 MG PO TABS: 1.0000 | ORAL_TABLET | Freq: Four times a day (QID) | ORAL | 0 refills | Status: DC | PRN

## 2020-07-12 NOTE — Telephone Encounter (Signed)
Pt is needing a refill on her oxyCODONE-acetaminophen (PERCOCET) 10-325 MG tablet sent in to the [facility] on [address].

## 2020-08-08 DIAGNOSIS — Z79811 Long term (current) use of aromatase inhibitors: Secondary | ICD-10-CM | POA: Diagnosis not present

## 2020-08-08 DIAGNOSIS — Z853 Personal history of malignant neoplasm of breast: Secondary | ICD-10-CM | POA: Diagnosis not present

## 2020-08-08 DIAGNOSIS — Z170 Estrogen receptor positive status [ER+]: Secondary | ICD-10-CM | POA: Diagnosis not present

## 2020-08-08 DIAGNOSIS — Z08 Encounter for follow-up examination after completed treatment for malignant neoplasm: Secondary | ICD-10-CM | POA: Diagnosis not present

## 2020-08-08 DIAGNOSIS — C50511 Malignant neoplasm of lower-outer quadrant of right female breast: Secondary | ICD-10-CM | POA: Diagnosis not present

## 2020-08-15 ENCOUNTER — Other Ambulatory Visit: Payer: Self-pay | Admitting: Neurology

## 2020-08-15 DIAGNOSIS — M545 Low back pain, unspecified: Secondary | ICD-10-CM

## 2020-08-15 MED ORDER — OXYCODONE-ACETAMINOPHEN 10-325 MG PO TABS: 1.0000 | ORAL_TABLET | Freq: Four times a day (QID) | ORAL | 0 refills | Status: DC | PRN

## 2020-08-15 NOTE — Telephone Encounter (Signed)
Pt is needing a refill on her oxyCODONE-acetaminophen (PERCOCET) 10-325 MG tablet sent in to the [facility] on [address] in [place]

## 2020-08-18 ENCOUNTER — Other Ambulatory Visit: Payer: Self-pay | Admitting: Family Medicine

## 2020-08-21 ENCOUNTER — Other Ambulatory Visit: Payer: Self-pay | Admitting: Family Medicine

## 2020-08-21 MED ORDER — VITAMIN D (ERGOCALCIFEROL) 1.25 MG (50000 UNIT) PO CAPS: 1.0000 | ORAL_CAPSULE | ORAL | 3 refills | Status: DC

## 2020-08-29 ENCOUNTER — Other Ambulatory Visit: Payer: Self-pay | Admitting: Family Medicine

## 2020-09-07 ENCOUNTER — Encounter: Payer: Self-pay | Admitting: Neurology

## 2020-09-07 ENCOUNTER — Ambulatory Visit (INDEPENDENT_AMBULATORY_CARE_PROVIDER_SITE_OTHER): Payer: Medicare HMO | Admitting: Neurology

## 2020-09-07 VITALS — BP 168/94 | HR 76 | Ht 65.0 in | Wt 224.0 lb

## 2020-09-07 DIAGNOSIS — G8929 Other chronic pain: Secondary | ICD-10-CM

## 2020-09-07 DIAGNOSIS — M7552 Bursitis of left shoulder: Secondary | ICD-10-CM

## 2020-09-07 DIAGNOSIS — M7062 Trochanteric bursitis, left hip: Secondary | ICD-10-CM | POA: Diagnosis not present

## 2020-09-07 DIAGNOSIS — M545 Low back pain, unspecified: Secondary | ICD-10-CM

## 2020-09-07 DIAGNOSIS — M25561 Pain in right knee: Secondary | ICD-10-CM

## 2020-09-07 DIAGNOSIS — M7061 Trochanteric bursitis, right hip: Secondary | ICD-10-CM

## 2020-09-07 DIAGNOSIS — M7551 Bursitis of right shoulder: Secondary | ICD-10-CM | POA: Insufficient documentation

## 2020-09-07 MED ORDER — BUTALBITAL-APAP-CAFFEINE 50-325-40 MG PO TABS: ORAL_TABLET | ORAL | 3 refills | Status: DC

## 2020-09-07 MED ORDER — LIDOCAINE 5 % EX OINT: 1.0000 "application " | TOPICAL_OINTMENT | CUTANEOUS | 5 refills | Status: DC | PRN

## 2020-09-07 MED ORDER — OXYCODONE-ACETAMINOPHEN 10-325 MG PO TABS: 1.0000 | ORAL_TABLET | Freq: Four times a day (QID) | ORAL | 0 refills | Status: DC | PRN

## 2020-09-07 MED ORDER — FLUCONAZOLE 150 MG PO TABS: 150.0000 mg | ORAL_TABLET | Freq: Every day | ORAL | 0 refills | Status: DC
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
## 2020-09-14 ENCOUNTER — Other Ambulatory Visit: Payer: Self-pay

## 2020-09-14 ENCOUNTER — Emergency Department (HOSPITAL_BASED_OUTPATIENT_CLINIC_OR_DEPARTMENT_OTHER)
Admission: EM | Admit: 2020-09-14 | Discharge: 2020-09-14 | Disposition: A | Discharge status: Home / Self Care | Payer: Medicare HMO | Attending: Emergency Medicine | Admitting: Emergency Medicine

## 2020-09-14 ENCOUNTER — Emergency Department (HOSPITAL_BASED_OUTPATIENT_CLINIC_OR_DEPARTMENT_OTHER): Payer: Medicare HMO

## 2020-09-14 DIAGNOSIS — Z87891 Personal history of nicotine dependence: Secondary | ICD-10-CM | POA: Diagnosis not present

## 2020-09-14 DIAGNOSIS — Z853 Personal history of malignant neoplasm of breast: Secondary | ICD-10-CM | POA: Insufficient documentation

## 2020-09-14 DIAGNOSIS — I10 Essential (primary) hypertension: Secondary | ICD-10-CM | POA: Insufficient documentation

## 2020-09-14 DIAGNOSIS — R197 Diarrhea, unspecified: Secondary | ICD-10-CM | POA: Diagnosis not present

## 2020-09-14 DIAGNOSIS — R112 Nausea with vomiting, unspecified: Secondary | ICD-10-CM | POA: Insufficient documentation

## 2020-09-14 DIAGNOSIS — R079 Chest pain, unspecified: Secondary | ICD-10-CM | POA: Diagnosis not present

## 2020-09-14 DIAGNOSIS — Z79899 Other long term (current) drug therapy: Secondary | ICD-10-CM | POA: Diagnosis not present

## 2020-09-14 DIAGNOSIS — R059 Cough, unspecified: Secondary | ICD-10-CM | POA: Insufficient documentation

## 2020-09-14 DIAGNOSIS — R0602 Shortness of breath: Secondary | ICD-10-CM | POA: Diagnosis not present

## 2020-09-14 DIAGNOSIS — R0789 Other chest pain: Secondary | ICD-10-CM | POA: Diagnosis not present

## 2020-09-14 LAB — CBC
HCT: 46.6 % — ABNORMAL HIGH (ref 36.0–46.0)
Hemoglobin: 15.8 g/dL — ABNORMAL HIGH (ref 12.0–15.0)
MCH: 30.7 pg (ref 26.0–34.0)
MCHC: 33.9 g/dL (ref 30.0–36.0)
MCV: 90.7 fL (ref 80.0–100.0)
Platelets: 260 10*3/uL (ref 150–400)
RBC: 5.14 MIL/uL — ABNORMAL HIGH (ref 3.87–5.11)
RDW: 13.2 % (ref 11.5–15.5)
WBC: 8.7 10*3/uL (ref 4.0–10.5)
nRBC: 0 % (ref 0.0–0.2)

## 2020-09-14 LAB — BASIC METABOLIC PANEL
Anion gap: 11 (ref 5–15)
BUN: 23 mg/dL (ref 8–23)
CO2: 23 mmol/L (ref 22–32)
Calcium: 9.6 mg/dL (ref 8.9–10.3)
Chloride: 102 mmol/L (ref 98–111)
Creatinine, Ser: 0.95 mg/dL (ref 0.44–1.00)
GFR, Estimated: >60 mL/min (ref >60)
Glucose, Bld: 83 mg/dL (ref 70–99)
Potassium: 3.5 mmol/L (ref 3.5–5.1)
Sodium: 136 mmol/L (ref 135–145)

## 2020-09-14 LAB — TROPONIN I (HIGH SENSITIVITY)
Troponin I (High Sensitivity): 4 ng/L (ref <18)
Troponin I (High Sensitivity): 4 ng/L (ref <18)

## 2020-09-14 MED ORDER — RABIES IMMUNE GLOBULIN 150 UNIT/ML IM INJ: 20.0000 [IU]/kg | INJECTION | Freq: Once | INTRAMUSCULAR | Status: DC

## 2020-09-14 MED ORDER — RABIES VACCINE, PCEC IM SUSR: 1.0000 mL | Freq: Once | INTRAMUSCULAR | Status: DC

## 2020-09-14 MED ORDER — ONDANSETRON HCL 4 MG/2ML IJ SOLN: 4.0000 mg | Freq: Once | INTRAMUSCULAR | Status: DC

## 2020-09-14 MED ORDER — ALUM & MAG HYDROXIDE-SIMETH 200-200-20 MG/5ML PO SUSP
30.0000 mL | Freq: Once | ORAL | Status: AC
  Administered 2020-09-14: 30 mL via ORAL
  Filled 2020-09-14: qty 30

## 2020-09-14 NOTE — ED Provider Notes (Signed)
Signout from Dr. Ralene Bathe.  65 year old female here with central chest pain.  Symptoms are improving after GI cocktail.  Plan is to follow-up on delta troponin and discharge if not elevated. Physical Exam  BP (!) 145/82   Pulse 70   Temp 98.4 F (36.9 C) (Oral)   Resp 20   Ht 5\' 5"  (1.651 m)   Wt 101.6 kg   SpO2 100%   BMI 37.28 kg/m   Physical Exam  ED Course/Procedures     Procedures  MDM  Second troponin not elevated.  Patient symptoms are improved.  Recommended close follow-up with her PCP.  Return instructions discussed       Hayden Rasmussen, MD 09/14/20 [phone]

## 2020-09-14 NOTE — ED Provider Notes (Signed)
[facility] EMERGENCY DEPARTMENT Provider Note   CSN: [id number] Arrival date & time: 09/14/20  1241     History Chief Complaint  Patient presents with  . Chest Pain    Kimberly Mann is a 65 y.o. female.  The history is provided by the patient and medical records.  Chest Pain  Kimberly Mann is a 65 y.o. female who presents to the Emergency Department complaining of chest pain.  She presents to the ED complaining of central chest pain that started at 3am.  Pain is described as indigestion (had mild sxs yesterday).  Has associated nausea and sob.  Pain is waxing and waning.  Pain seems to improve with walking and worsen when she rests.    Felt hot but didn't check her temperature at home.   No abdominal pain.  Has slight cough, started this morning.  Vomited this morning.  Mild diarrhea. No leg edema.      Past Medical History:  Diagnosis Date  . Anxiety   . Cancer (HCC)    breast  . Chronic back pain    takes oxycodone  . Complication of anesthesia   . Eczema   . Environmental allergies   . Headache   . Hypertension   . PONV (postoperative nausea and vomiting)   . Vertigo     Patient Active Problem List   Diagnosis Date Noted  . Subacromial bursitis of both shoulders 09/07/2020  . Migraine without aura and without status migrainosus, not intractable 12/22/2019  . Non-intractable vomiting 08/25/2019  . Chronic rhinitis 03/20/2018  . Adverse food reaction 03/20/2018  . Drug reaction 03/20/2018  . Seasonal allergies 01/22/2018  . Breast cancer ([place]) 12/11/2017  . Vertigo 06/16/2017  . Essential hypertension 06/16/2017  . Trochanteric bursitis, left hip 01/20/2017  . Trochanteric bursitis, right hip 09/18/2016  . Ear pain, bilateral 09/18/2016  . Back pain, lumbosacral 05/20/2016  . Bilateral shoulder pain 09/13/2015  . Trochanteric bursitis of left hip 05/17/2015  . Bilateral sciatica 05/17/2015  . Subacromial bursitis 12/13/2014  .  Osteoarthritis of left shoulder 05/17/2014  . Neck pain 05/17/2014  . Chronic midline low back pain without sciatica 05/17/2014  . Migraine 05/17/2014    Past Surgical History:  Procedure Laterality Date  . BREAST REDUCTION SURGERY Left 11/20/2017   Procedure: left breast reduction for symmetry and liposuction bilateral lateral breasts;  Surgeon: Wallace Going, DO;  Location: [facility];  Service: Plastics;  Laterality: Left;  . BREAST SURGERY     rt breast lumpectomy  . LIPOSUCTION    . TUBAL LIGATION       OB History    Gravida  1   Para  1   Term  1   Preterm      AB      Living        SAB      IAB      Ectopic      Multiple      Live Births  1           Family History  Problem Relation Age of Onset  . Hypertension Mother     Social History   Tobacco Use  . Smoking status: Former Smoker    Quit date: 05/17/1974    Years since quitting: 46.3  . Smokeless tobacco: Never Used  Vaping Use  . Vaping Use: Never used  Substance Use Topics  . Alcohol use: Yes    Alcohol/week: 7.0 standard drinks  Types: 7 Standard drinks or equivalent per week    Comment: one glass of wine with dinner/fim  . Drug use: No    Types: Marijuana    Home Medications Prior to Admission medications   Medication Sig Start Date End Date Taking? Authorizing Provider  ALPRAZolam Duanne Moron) 0.5 MG tablet Take one or two pills before the MRi 04/04/20   Shelda Pal, DO  butalbital-acetaminophen-caffeine (FIORICET) [phone] MG tablet Take one po qd prn headache 09/07/20   Sater, Nanine Means, MD  diclofenac sodium (VOLTAREN) 1 % GEL Apply 2 g topically 4 (four) times daily as needed. 04/16/18   Sater, Nanine Means, MD  fluconazole (DIFLUCAN) 150 MG tablet Take 1 tablet (150 mg total) by mouth daily. 09/07/20   Sater, Nanine Means, MD  fluticasone Asencion Islam) 50 MCG/ACT nasal spray Use 2 spray(s) in each nostril once daily 08/30/20   Wendling, Crosby Oyster, DO   hydrOXYzine (VISTARIL) 25 MG capsule Take 1 capsule (25 mg total) by mouth 3 (three) times daily as needed. 04/12/19   Sater, Nanine Means, MD  levocetirizine (XYZAL) 5 MG tablet Take 1 tablet (5 mg total) by mouth every evening. 11/02/19   Shelda Pal, DO  lidocaine (XYLOCAINE) 5 % ointment Apply 1 application topically as needed. 09/07/20   Sater, Nanine Means, MD  montelukast (SINGULAIR) 10 MG tablet Take 1 tablet (10 mg total) by mouth at bedtime. 11/02/19   Shelda Pal, DO  Multiple Vitamins-Minerals (MULTIVITAMIN ADULT PO)  03/29/15   [provider]  omeprazole (PRILOSEC) 40 MG capsule Take 1 capsule (40 mg total) by mouth daily. 12/28/19   Shelda Pal, DO  oxyCODONE-acetaminophen (PERCOCET) 10-325 MG tablet Take 1 tablet by mouth every 6 (six) hours as needed for pain. 09/07/20   Sater, Nanine Means, MD  pantoprazole (PROTONIX) 40 MG tablet Take 1 tablet (40 mg total) by mouth daily. 11/18/18   Shelda Pal, DO  potassium chloride (KLOR-CON) 10 MEQ tablet Take 1 tablet by mouth twice daily for 7 days 12/08/19   Shelda Pal, DO  promethazine (PHENERGAN) 25 MG tablet 25 mg as needed.  03/05/15   [provider]  sucralfate (CARAFATE) 1 GM/10ML suspension Take 10 mLs (1 g total) by mouth 4 (four) times daily -  with meals and at bedtime. 12/22/19   Sater, Nanine Means, MD  tamoxifen (NOLVADEX) 20 MG tablet Take 20 mg by mouth daily.    [provider]  triamterene-hydrochlorothiazide [facility]) 37.5-25 MG tablet Take 1/2 (one-half) tablet by mouth once daily 05/08/20   Wendling, Crosby Oyster, DO  Vitamin D, Ergocalciferol, (DRISDOL) 1.25 MG (50000 UNIT) CAPS capsule Take 1 capsule (50,000 Units total) by mouth once a week. 08/21/20   Shelda Pal, DO    Allergies    Morphine and related, Penicillins, and Sulfa antibiotics  Review of Systems   Review of Systems  Cardiovascular: Positive for chest pain.  All other  systems reviewed and are negative.   Physical Exam Updated Vital Signs BP (!) 151/114   Pulse 68   Temp 98.4 F (36.9 C) (Oral)   Resp 20   Ht 5\' 5"  (1.651 m)   Wt 101.6 kg   SpO2 100%   BMI 37.28 kg/m   Physical Exam Vitals and nursing note reviewed.  Constitutional:      Appearance: She is well-developed.  HENT:     Head: Normocephalic and atraumatic.  Cardiovascular:     Rate and Rhythm: Normal rate and  regular rhythm.     Heart sounds: No murmur heard.   Pulmonary:     Effort: Pulmonary effort is normal. No respiratory distress.     Breath sounds: Normal breath sounds.  Abdominal:     Palpations: Abdomen is soft.     Tenderness: There is no abdominal tenderness. There is no guarding or rebound.  Musculoskeletal:        General: No tenderness.  Skin:    General: Skin is warm and dry.  Neurological:     Mental Status: She is alert and oriented to person, place, and time.  Psychiatric:        Behavior: Behavior normal.     ED Results / Procedures / Treatments   Labs (all labs ordered are listed, but only abnormal results are displayed) Labs Reviewed  CBC - Abnormal; Notable for the following components:      Result Value   RBC 5.14 (*)    Hemoglobin 15.8 (*)    HCT 46.6 (*)    All other components within normal limits  BASIC METABOLIC PANEL  TROPONIN I (HIGH SENSITIVITY)  TROPONIN I (HIGH SENSITIVITY)    EKG EKG Interpretation  Date/Time:  Thursday Sep 14 2020 13:18:21 EDT Ventricular Rate:  72 PR Interval:  156 QRS Duration: 88 QT Interval:  422 QTC Calculation: 462 R Axis:   9 Text Interpretation: Normal sinus rhythm Nonspecific T wave abnormality Abnormal ECG Confirmed by Quintella Reichert [phone]) on 09/14/2020 1:22:26 PM   Radiology DG Chest 2 View  Result Date: 09/14/2020 CLINICAL DATA:  Chest pain.  Lightheadedness.  Hypertension. EXAM: CHEST - 2 VIEW COMPARISON:  11/26/2019 FINDINGS: Numerous leads and wires project over the chest.  Midline trachea. Normal heart size and mediastinal contours. No pleural effusion or pneumothorax. Clear lungs. IMPRESSION: No acute cardiopulmonary disease. Electronically Signed   By: Abigail Miyamoto M.D.   On: 09/14/2020 14:12    Procedures Procedures   Medications Ordered in ED Medications  alum & mag hydroxide-simeth (MAALOX/MYLANTA) 200-200-20 MG/5ML suspension 30 mL (30 mLs Oral Given 09/14/20 1427)    ED Course  I have reviewed the triage vital signs and the nursing notes.  Pertinent labs & imaging results that were available during my care of the patient were reviewed by me and considered in my medical decision making (see chart for details).    MDM Rules/Calculators/A&P                         patient here for evaluation of chest pain with associated indigestion since this morning. Initial troponin is negative, pain is improved after G.I. cocktail. She was hypertensive on ED presentation, but her blood pressure did improve on recheck without intervention. Patient care transferred pending repeat troponin. Presentation is not consistent with PE, dissection Final Clinical Impression(s) / ED Diagnoses Final diagnoses:  None    Rx / DC Orders ED Discharge Orders    None       Quintella Reichert, MD 09/14/20 1537

## 2020-09-14 NOTE — ED Triage Notes (Signed)
Pt reports feeling of midsternal chest pain since about 5am this morning, states it "feels like indigestion."  Pt reports associated light-headedness and h/o HTN

## 2020-09-14 NOTE — Discharge Instructions (Addendum)
You are seen in the emergency department for evaluation of chest pain.  You had blood work EKG and a chest x-ray that did not show any serious findings.  This pain may be related to some acid reflux.  Please use Maalox between meals and at bedtime.  Contact your primary care doctor for follow-up.  Return to the emergency department for any worsening or concerning symptoms

## 2020-09-18 ENCOUNTER — Telehealth: Payer: Self-pay | Admitting: *Deleted

## 2020-09-18 ENCOUNTER — Ambulatory Visit: Payer: Medicare HMO | Admitting: Family Medicine

## 2020-09-18 NOTE — Telephone Encounter (Signed)
Contact Type Call Who Is Calling Patient / Member / Family / Caregiver Caller Name Tiffony Kite Phone Number [phone] Patient Name Kimberly Mann Patient DOB [birth year] Call Type Message Only Information Provided Reason for Call Request to Reschedule Office Appointment Initial Comment Needing to reschedule appt that is for 09/18/2020 at 230pm. Patient request to speak to RN No Disp. Time Disposition Final User 09/17/2020 1:19:18 PM General Information Provided Yes Silvano Rusk

## 2020-09-18 NOTE — Telephone Encounter (Signed)
Appt cancelled for today.  Left message on machine to call back to reschedule appointment.

## 2020-10-10 ENCOUNTER — Other Ambulatory Visit: Payer: Self-pay | Admitting: Neurology

## 2020-10-10 DIAGNOSIS — M545 Low back pain, unspecified: Secondary | ICD-10-CM

## 2020-10-10 MED ORDER — OXYCODONE-ACETAMINOPHEN 10-325 MG PO TABS: 1.0000 | ORAL_TABLET | Freq: Four times a day (QID) | ORAL | 0 refills | Status: DC | PRN

## 2020-10-10 NOTE — Telephone Encounter (Signed)
Pt is needing a refill on her oxyCODONE-acetaminophen (PERCOCET) 10-325 MG tablet sent in to the [facility] on [address]

## 2020-10-20 DIAGNOSIS — R922 Inconclusive mammogram: Secondary | ICD-10-CM | POA: Diagnosis not present

## 2020-10-20 DIAGNOSIS — Z170 Estrogen receptor positive status [ER+]: Secondary | ICD-10-CM | POA: Diagnosis not present

## 2020-10-20 DIAGNOSIS — Z853 Personal history of malignant neoplasm of breast: Secondary | ICD-10-CM | POA: Diagnosis not present

## 2020-10-20 DIAGNOSIS — C50511 Malignant neoplasm of lower-outer quadrant of right female breast: Secondary | ICD-10-CM | POA: Diagnosis not present

## 2020-10-25 DIAGNOSIS — Z170 Estrogen receptor positive status [ER+]: Secondary | ICD-10-CM | POA: Diagnosis not present

## 2020-10-25 DIAGNOSIS — R490 Dysphonia: Secondary | ICD-10-CM | POA: Diagnosis not present

## 2020-10-25 DIAGNOSIS — C50511 Malignant neoplasm of lower-outer quadrant of right female breast: Secondary | ICD-10-CM | POA: Diagnosis not present

## 2020-11-09 ENCOUNTER — Other Ambulatory Visit: Payer: Self-pay | Admitting: Neurology

## 2020-11-09 DIAGNOSIS — M545 Low back pain, unspecified: Secondary | ICD-10-CM

## 2020-11-09 MED ORDER — OXYCODONE-ACETAMINOPHEN 10-325 MG PO TABS: 1.0000 | ORAL_TABLET | Freq: Four times a day (QID) | ORAL | 0 refills | Status: DC | PRN

## 2020-11-09 NOTE — Telephone Encounter (Signed)
Pt is needing a refill on her oxyCODONE-acetaminophen (PERCOCET) 10-325 MG tablet sent in to the [facility] on [address]

## 2020-11-13 DIAGNOSIS — J381 Polyp of vocal cord and larynx: Secondary | ICD-10-CM | POA: Diagnosis not present

## 2020-11-13 DIAGNOSIS — H81399 Other peripheral vertigo, unspecified ear: Secondary | ICD-10-CM | POA: Diagnosis not present

## 2020-11-13 DIAGNOSIS — R490 Dysphonia: Secondary | ICD-10-CM | POA: Diagnosis not present

## 2020-11-22 DIAGNOSIS — H8113 Benign paroxysmal vertigo, bilateral: Secondary | ICD-10-CM | POA: Diagnosis not present

## 2020-11-30 DIAGNOSIS — H81399 Other peripheral vertigo, unspecified ear: Secondary | ICD-10-CM | POA: Diagnosis not present

## 2020-12-01 ENCOUNTER — Other Ambulatory Visit: Payer: Self-pay

## 2020-12-01 ENCOUNTER — Encounter: Payer: Self-pay | Admitting: Family Medicine

## 2020-12-01 ENCOUNTER — Ambulatory Visit (INDEPENDENT_AMBULATORY_CARE_PROVIDER_SITE_OTHER): Payer: Medicare HMO | Admitting: Family Medicine

## 2020-12-01 VITALS — BP 134/86 | HR 87 | Temp 98.2°F | Ht 65.0 in | Wt 225.4 lb

## 2020-12-01 DIAGNOSIS — R635 Abnormal weight gain: Secondary | ICD-10-CM

## 2020-12-01 DIAGNOSIS — E559 Vitamin D deficiency, unspecified: Secondary | ICD-10-CM | POA: Diagnosis not present

## 2020-12-01 DIAGNOSIS — I10 Essential (primary) hypertension: Secondary | ICD-10-CM | POA: Diagnosis not present

## 2020-12-01 DIAGNOSIS — C50919 Malignant neoplasm of unspecified site of unspecified female breast: Secondary | ICD-10-CM | POA: Diagnosis not present

## 2020-12-01 DIAGNOSIS — Z0000 Encounter for general adult medical examination without abnormal findings: Secondary | ICD-10-CM | POA: Diagnosis not present

## 2020-12-01 LAB — LIPID PANEL
Cholesterol: 187 mg/dL (ref 0–200)
HDL: 47.2 mg/dL (ref >39.00)
LDL Cholesterol: 119 mg/dL — ABNORMAL HIGH (ref 0–99)
NonHDL: 139.89
Total CHOL/HDL Ratio: 4
Triglycerides: 105 mg/dL (ref 0.0–149.0)
VLDL: 21 mg/dL (ref 0.0–40.0)

## 2020-12-01 LAB — COMPREHENSIVE METABOLIC PANEL
ALT: 12 U/L (ref 0–35)
AST: 15 U/L (ref 0–37)
Albumin: 3.9 g/dL (ref 3.5–5.2)
Alkaline Phosphatase: 53 U/L (ref 39–117)
BUN: 13 mg/dL (ref 6–23)
CO2: 26 mEq/L (ref 19–32)
Calcium: 8.8 mg/dL (ref 8.4–10.5)
Chloride: 106 mEq/L (ref 96–112)
Creatinine, Ser: 0.73 mg/dL (ref 0.40–1.20)
GFR: 85.71 mL/min (ref >60.00)
Glucose, Bld: 88 mg/dL (ref 70–99)
Potassium: 3.6 mEq/L (ref 3.5–5.1)
Sodium: 141 mEq/L (ref 135–145)
Total Bilirubin: 0.3 mg/dL (ref 0.2–1.2)
Total Protein: 6.8 g/dL (ref 6.0–8.3)

## 2020-12-01 LAB — TSH: TSH: 2.17 u[IU]/mL (ref 0.35–5.50)

## 2020-12-01 LAB — VITAMIN D 25 HYDROXY (VIT D DEFICIENCY, FRACTURES): VITD: 55.99 ng/mL (ref 30.00–100.00)

## 2020-12-01 MED ORDER — VITAMIN D (ERGOCALCIFEROL) 1.25 MG (50000 UNIT) PO CAPS: 50000.0000 [IU] | ORAL_CAPSULE | ORAL | 3 refills | Status: DC

## 2020-12-01 MED ORDER — FLUTICASONE PROPIONATE 50 MCG/ACT NA SUSP: NASAL | 0 refills | Status: DC

## 2020-12-01 NOTE — Progress Notes (Signed)
Chief Complaint  Patient presents with   Follow-up     Well Woman Kimberly Mann is here for a complete physical.   Her last physical was >1 year ago.  Current diet: in general, diet is fair. Current exercise: walking. Weight is increased a little and she denies daytime fatigue. Seatbelt? Yes  Health Maintenance Colonoscopy- Yes Shingrix- No DEXA- Yes Mammogram- Yes Tetanus- Yes Pneumonia- Yes Hep C screen- Yes  Past Medical History:  Diagnosis Date   Anxiety    Cancer ([place])    breast   Chronic back pain    takes oxycodone   Complication of anesthesia    Eczema    Environmental allergies    Headache    Hypertension    PONV (postoperative nausea and vomiting)    Vertigo      Past Surgical History:  Procedure Laterality Date   BREAST REDUCTION SURGERY Left 11/20/2017   Procedure: left breast reduction for symmetry and liposuction bilateral lateral breasts;  Surgeon: Wallace Going, DO;  Location: [facility];  Service: Plastics;  Laterality: Left;   BREAST SURGERY     rt breast lumpectomy   LIPOSUCTION     TUBAL LIGATION      Medications  Current Outpatient Medications on File Prior to Visit  Medication Sig Dispense Refill   ALPRAZolam (XANAX) 0.5 MG tablet Take one or two pills before the MRi 2 tablet 0   butalbital-acetaminophen-caffeine (FIORICET) 50-325-40 MG tablet Take one po qd prn headache 15 tablet 3   diclofenac sodium (VOLTAREN) 1 % GEL Apply 2 g topically 4 (four) times daily as needed. 200 g 5   fluconazole (DIFLUCAN) 150 MG tablet Take 1 tablet (150 mg total) by mouth daily. 15 tablet 0   hydrOXYzine (VISTARIL) 25 MG capsule Take 1 capsule (25 mg total) by mouth 3 (three) times daily as needed. 60 capsule 1   levocetirizine (XYZAL) 5 MG tablet Take 1 tablet (5 mg total) by mouth every evening. 90 tablet 2   lidocaine (XYLOCAINE) 5 % ointment Apply 1 application topically as needed. 35.44 g 5   montelukast (SINGULAIR) 10 MG  tablet Take 1 tablet (10 mg total) by mouth at bedtime. 30 tablet 3   Multiple Vitamins-Minerals (MULTIVITAMIN ADULT PO)      omeprazole (PRILOSEC) 40 MG capsule Take 1 capsule (40 mg total) by mouth daily. 30 capsule 1   oxyCODONE-acetaminophen (PERCOCET) 10-325 MG tablet Take 1 tablet by mouth every 6 (six) hours as needed for pain. 120 tablet 0   pantoprazole (PROTONIX) 40 MG tablet Take 1 tablet (40 mg total) by mouth daily. 30 tablet 3   potassium chloride (KLOR-CON) 10 MEQ tablet Take 1 tablet by mouth twice daily for 7 days 14 tablet 0   promethazine (PHENERGAN) 25 MG tablet 25 mg as needed.      sucralfate (CARAFATE) 1 GM/10ML suspension Take 10 mLs (1 g total) by mouth 4 (four) times daily -  with meals and at bedtime. 420 mL 1   tamoxifen (NOLVADEX) 20 MG tablet Take 20 mg by mouth daily.     triamterene-hydrochlorothiazide (MAXZIDE-25) 37.5-25 MG tablet Take 1/2 (one-half) tablet by mouth once daily 90 tablet 0   Allergies Allergies  Allergen Reactions   Morphine And Related Nausea And Vomiting and Swelling   Penicillins    Sulfa Antibiotics     Review of Systems: Constitutional:  no fevers Eye:  no recent significant change in vision Ears:  No changes in hearing  Nose/Mouth/Throat:  no complaints of nasal congestion, no sore throat Cardiovascular: no chest pain Respiratory:  No shortness of breath Gastrointestinal:  No change in bowel habits GU:  Female: +spotting Integumentary:  no abnormal skin lesions reported Neurologic:  +vertigo Endocrine:  denies unexplained weight loss  Exam BP 134/86   Pulse 87   Temp 98.2 F (36.8 C) (Oral)   Ht '5\' 5"'$  (1.651 m)   Wt 225 lb 6 oz (102.2 kg)   SpO2 99%   BMI 37.50 kg/m  General:  well developed, well nourished, in no apparent distress Skin:  no significant moles, warts, or growths Head:  no masses, lesions, or tenderness Eyes:  pupils equal and round, sclera anicteric without injection Ears:  canals without lesions, TMs  shiny without retraction, no obvious effusion, no erythema Nose:  nares patent, septum midline, mucosa normal, and no drainage or sinus tenderness Throat/Pharynx:  lips and gingiva without lesion; tongue and uvula midline; non-inflamed pharynx; no exudates or postnasal drainage Neck: neck supple without adenopathy, thyromegaly, or masses Lungs:  clear to auscultation, breath sounds equal bilaterally, no respiratory distress Cardio:  regular rate and rhythm, no bruits or LE edema Abdomen:  abdomen soft, nontender; bowel sounds normal; no masses or organomegaly Genital: Deferred Neuro:  gait normal; deep tendon reflexes normal and symmetric Psych: well oriented with normal range of affect and appropriate judgment/insight  Assessment and Plan  Well adult exam  Essential hypertension - Plan: Comprehensive metabolic panel, Lipid panel  Weight gain - Plan: TSH  Vitamin D insufficiency - Plan: VITAMIN D 25 Hydroxy (Vit-D Deficiency, Fractures)  Malignant neoplasm of female breast, unspecified estrogen receptor status, unspecified laterality, unspecified site of breast ([place]), Chronic   Well 65 y.o. female. Counseled on diet and exercise. Other orders as above. She did mention to me some vaginal spotting and lower abdominal cramping that took place 2 days ago.  She does have a history of breast cancer.  I would like her to schedule a follow-up appointment with her gynecologist.  I did mention that we want to rule out any uterine cancer. Follow up in 6 mo. The patient voiced understanding and agreement to the plan.  [place], DO 12/01/20 10:53 AM

## 2020-12-01 NOTE — Patient Instructions (Addendum)
Give [place] 2-3 business days to get the results of your labs back.   Keep the diet clean and stay active.  Follow up with your gynecologist regarding the spotting. This is not urgent, but I would like something scheduled in the next few weeks.   I recommend getting the flu shot in mid October. This suggestion would change if the CDC comes out with a different recommendation.   Let us know if you need anything.

## 2020-12-04 ENCOUNTER — Other Ambulatory Visit: Payer: Self-pay

## 2020-12-04 ENCOUNTER — Encounter: Payer: Self-pay | Admitting: Obstetrics & Gynecology

## 2020-12-04 ENCOUNTER — Ambulatory Visit (INDEPENDENT_AMBULATORY_CARE_PROVIDER_SITE_OTHER): Payer: Medicare HMO | Admitting: Obstetrics & Gynecology

## 2020-12-04 ENCOUNTER — Other Ambulatory Visit (HOSPITAL_COMMUNITY)
Admission: RE | Admit: 2020-12-04 | Discharge: 2020-12-04 | Disposition: A | Discharge status: Home / Self Care | Payer: Medicare HMO | Source: Ambulatory Visit | Attending: Obstetrics & Gynecology | Admitting: Obstetrics & Gynecology

## 2020-12-04 ENCOUNTER — Ambulatory Visit (HOSPITAL_BASED_OUTPATIENT_CLINIC_OR_DEPARTMENT_OTHER)
Admission: RE | Admit: 2020-12-04 | Discharge: 2020-12-04 | Disposition: A | Discharge status: Home / Self Care | Payer: Medicare HMO | Source: Ambulatory Visit | Attending: Obstetrics & Gynecology | Admitting: Obstetrics & Gynecology

## 2020-12-04 VITALS — BP 147/101 | HR 76 | Ht 65.0 in | Wt 225.0 lb

## 2020-12-04 DIAGNOSIS — N950 Postmenopausal bleeding: Secondary | ICD-10-CM

## 2020-12-04 DIAGNOSIS — Z853 Personal history of malignant neoplasm of breast: Secondary | ICD-10-CM | POA: Insufficient documentation

## 2020-12-04 DIAGNOSIS — N840 Polyp of corpus uteri: Secondary | ICD-10-CM | POA: Diagnosis not present

## 2020-12-04 DIAGNOSIS — Z01411 Encounter for gynecological examination (general) (routine) with abnormal findings: Secondary | ICD-10-CM | POA: Diagnosis not present

## 2020-12-04 DIAGNOSIS — R9389 Abnormal findings on diagnostic imaging of other specified body structures: Secondary | ICD-10-CM | POA: Diagnosis not present

## 2020-12-04 DIAGNOSIS — Z9229 Personal history of other drug therapy: Secondary | ICD-10-CM

## 2020-12-04 DIAGNOSIS — Z1151 Encounter for screening for human papillomavirus (HPV): Secondary | ICD-10-CM | POA: Insufficient documentation

## 2020-12-04 DIAGNOSIS — Z01419 Encounter for gynecological examination (general) (routine) without abnormal findings: Secondary | ICD-10-CM

## 2020-12-04 DIAGNOSIS — D251 Intramural leiomyoma of uterus: Secondary | ICD-10-CM | POA: Diagnosis not present

## 2020-12-04 NOTE — Progress Notes (Signed)
Subjective:     Kimberly Mann is a 65 y.o. female here for eval of PMPB and routine exam.  Current complaints: Pt reports that she started having bleeding on last week. She denies pain. She has been on Tamixifen for 2+ years. She is worried that this bleeding might be related to the breat cancer.       Gynecologic History No LMP recorded. Patient is postmenopausal. Contraception: none Last Pap: 08/13/2017. Results were: normal Last mammogram: 09/2020 at [facility] History OB History  Gravida Para Term Preterm AB Living  '1 1 1        '$ SAB IAB Ectopic Multiple Live Births          1    # Outcome Date GA Lbr Len/2nd Weight Sex Delivery Anes PTL Lv  1 Term      Vag-Spont       The following portions of the patient's history were reviewed and updated as appropriate: allergies, current medications, past family history, past medical history, past social history, past surgical history, and problem list.  Review of Systems Pertinent items are noted in HPI.    Objective:  BP (!) 147/101   Pulse 76   Ht '5\' 5"'$  (1.651 m)   Wt 225 lb (102.1 kg)   BMI 37.44 kg/m   General Appearance:    Alert, cooperative, no distress, appears stated age  Head:    Normocephalic, without obvious abnormality, atraumatic  Eyes:    conjunctiva/corneas clear, EOM's intact, both eyes  Ears:    Normal external ear canals, both ears  Nose:   Nares normal, septum midline, mucosa normal, no drainage    or sinus tenderness  Throat:   Lips, mucosa, and tongue normal; teeth and gums normal  Neck:   Supple, symmetrical, trachea midline, no adenopathy;    thyroid:  no enlargement/tenderness/nodules  Back:     Symmetric, no curvature, ROM normal, no CVA tenderness  Lungs:     respirations unlabored  Chest Wall:    No tenderness or deformity   Heart:    Regular rate and rhythm  Breast Exam:    No tenderness, masses, or nipple abnormality  Abdomen:     Soft, non-tender, bowel sounds active all four quadrants,     no masses, no organomegaly  Genitalia:    Normal female without lesion, discharge or tenderness   The cervix is prolapsed to the level of the introitus   Extremities:   Extremities normal, atraumatic, no cyanosis or edema  Pulses:   2+ and symmetric all extremities  Skin:   Skin color, texture, turgor normal, no rashes or lesions    GYN procedure:   The indications for endometrial biopsy were reviewed.   Risks of the biopsy including cramping, bleeding, infection, uterine perforation, inadequate specimen and need for additional procedures  were discussed. The patient states she understands and agrees to undergo procedure today. Consent was signed. Time out was performed. Urine HCG was negative. A sterile speculum was placed in the patient's vagina and the cervix was prepped with Betadine. A single-toothed tenaculum was placed on the anterior lip of the cervix to stabilize it. The 3 mm pipelle was introduced into the endometrial cavity without difficulty to a depth of 9.5cm, and a moderate amount of tissue was obtained and sent to pathology. The instruments were removed from the patient's vagina. Minimal bleeding from the cervix was noted. The patient tolerated the procedure well.  Assessment:    Healthy female exam.  Post menopausal bleeding on Tamoxifen     Plan:   Diagnoses and all orders for this visit:  Well female exam with routine gynecological exam -     Cytology - PAP( [place])  Post-menopausal bleeding -     Surgical pathology -     US PELVIS TRANSVAGINAL NON-OB (TV ONLY); Future  History of right breast cancer -     Surgical pathology  H/O tamoxifen therapy -     Surgical pathology   Routine post-procedure instructions were given to the patient. The patient will follow up to review the results and for further management.    F/u next week via MyChart. Pt reports that she is having surgery on her vocal cords and will be unable to speak but, would like to converse  via MyChart.   Edilberto Roosevelt L. Harraway-Smith, M.D., Cherlynn June

## 2020-12-04 NOTE — Patient Instructions (Signed)

## 2020-12-05 ENCOUNTER — Encounter: Payer: Self-pay | Admitting: General Practice

## 2020-12-05 LAB — SURGICAL PATHOLOGY

## 2020-12-05 LAB — CYTOLOGY - PAP
Comment: NEGATIVE
Diagnosis: NEGATIVE
High risk HPV: NEGATIVE

## 2020-12-06 DIAGNOSIS — J382 Nodules of vocal cords: Secondary | ICD-10-CM | POA: Diagnosis not present

## 2020-12-06 DIAGNOSIS — R490 Dysphonia: Secondary | ICD-10-CM | POA: Diagnosis not present

## 2020-12-06 DIAGNOSIS — Z87891 Personal history of nicotine dependence: Secondary | ICD-10-CM | POA: Diagnosis not present

## 2020-12-06 DIAGNOSIS — J381 Polyp of vocal cord and larynx: Secondary | ICD-10-CM | POA: Diagnosis not present

## 2020-12-11 ENCOUNTER — Telehealth: Payer: Self-pay | Admitting: Neurology

## 2020-12-11 ENCOUNTER — Other Ambulatory Visit: Payer: Self-pay | Admitting: Neurology

## 2020-12-11 DIAGNOSIS — M545 Low back pain, unspecified: Secondary | ICD-10-CM

## 2020-12-11 MED ORDER — OXYCODONE-ACETAMINOPHEN 10-325 MG PO TABS: 1.0000 | ORAL_TABLET | Freq: Four times a day (QID) | ORAL | 0 refills | Status: DC | PRN

## 2020-12-11 NOTE — Telephone Encounter (Signed)
Pt called requesting refill for oxyCODONE-acetaminophen (PERCOCET) 10-325 MG tablet. [place] [phone].

## 2020-12-11 NOTE — Telephone Encounter (Signed)
I have routed this request to Dr Sater for review. The pt is due for the medication and [place] registry was verified.  

## 2020-12-14 ENCOUNTER — Telehealth: Payer: Self-pay | Admitting: *Deleted

## 2020-12-14 NOTE — Telephone Encounter (Signed)
Call to patient. Left message calling to discuss surgery scheduling.  Have received instructions regarding recent surgery and talking restrictions so also sent My Chart message regarding plans to schedule GYN surgery.

## 2020-12-21 ENCOUNTER — Emergency Department (HOSPITAL_BASED_OUTPATIENT_CLINIC_OR_DEPARTMENT_OTHER): Payer: Medicare HMO

## 2020-12-21 ENCOUNTER — Encounter (HOSPITAL_BASED_OUTPATIENT_CLINIC_OR_DEPARTMENT_OTHER): Payer: Self-pay | Admitting: *Deleted

## 2020-12-21 ENCOUNTER — Emergency Department (HOSPITAL_BASED_OUTPATIENT_CLINIC_OR_DEPARTMENT_OTHER)
Admission: EM | Admit: 2020-12-21 | Discharge: 2020-12-22 | Disposition: A | Discharge status: Home / Self Care | Payer: Medicare HMO | Attending: Emergency Medicine | Admitting: Emergency Medicine

## 2020-12-21 ENCOUNTER — Other Ambulatory Visit: Payer: Self-pay

## 2020-12-21 DIAGNOSIS — R0602 Shortness of breath: Secondary | ICD-10-CM

## 2020-12-21 DIAGNOSIS — R2232 Localized swelling, mass and lump, left upper limb: Secondary | ICD-10-CM | POA: Diagnosis not present

## 2020-12-21 DIAGNOSIS — Z87891 Personal history of nicotine dependence: Secondary | ICD-10-CM | POA: Diagnosis not present

## 2020-12-21 DIAGNOSIS — M7989 Other specified soft tissue disorders: Secondary | ICD-10-CM | POA: Diagnosis not present

## 2020-12-21 DIAGNOSIS — M79622 Pain in left upper arm: Secondary | ICD-10-CM | POA: Diagnosis not present

## 2020-12-21 DIAGNOSIS — Z853 Personal history of malignant neoplasm of breast: Secondary | ICD-10-CM | POA: Insufficient documentation

## 2020-12-21 DIAGNOSIS — I10 Essential (primary) hypertension: Secondary | ICD-10-CM | POA: Diagnosis not present

## 2020-12-21 DIAGNOSIS — R079 Chest pain, unspecified: Secondary | ICD-10-CM | POA: Diagnosis not present

## 2020-12-21 DIAGNOSIS — M79602 Pain in left arm: Secondary | ICD-10-CM | POA: Diagnosis not present

## 2020-12-21 DIAGNOSIS — Z79899 Other long term (current) drug therapy: Secondary | ICD-10-CM | POA: Insufficient documentation

## 2020-12-21 DIAGNOSIS — R0789 Other chest pain: Secondary | ICD-10-CM | POA: Diagnosis not present

## 2020-12-21 LAB — COMPREHENSIVE METABOLIC PANEL
ALT: 17 U/L (ref 0–44)
AST: 26 U/L (ref 15–41)
Albumin: 4.1 g/dL (ref 3.5–5.0)
Alkaline Phosphatase: 60 U/L (ref 38–126)
Anion gap: 11 (ref 5–15)
BUN: 15 mg/dL (ref 8–23)
CO2: 25 mmol/L (ref 22–32)
Calcium: 9.4 mg/dL (ref 8.9–10.3)
Chloride: 103 mmol/L (ref 98–111)
Creatinine, Ser: 0.98 mg/dL (ref 0.44–1.00)
GFR, Estimated: >60 mL/min (ref >60)
Glucose, Bld: 112 mg/dL — ABNORMAL HIGH (ref 70–99)
Potassium: 3.7 mmol/L (ref 3.5–5.1)
Sodium: 139 mmol/L (ref 135–145)
Total Bilirubin: 0.1 mg/dL — ABNORMAL LOW (ref 0.3–1.2)
Total Protein: 7.6 g/dL (ref 6.5–8.1)

## 2020-12-21 LAB — CBC WITH DIFFERENTIAL/PLATELET
Abs Immature Granulocytes: 0.02 10*3/uL (ref 0.00–0.07)
Basophils Absolute: 0 10*3/uL (ref 0.0–0.1)
Basophils Relative: 1 %
Eosinophils Absolute: 0.1 10*3/uL (ref 0.0–0.5)
Eosinophils Relative: 1 %
HCT: 42.9 % (ref 36.0–46.0)
Hemoglobin: 14.1 g/dL (ref 12.0–15.0)
Immature Granulocytes: 0 %
Lymphocytes Relative: 31 %
Lymphs Abs: 2.4 10*3/uL (ref 0.7–4.0)
MCH: 29.9 pg (ref 26.0–34.0)
MCHC: 32.9 g/dL (ref 30.0–36.0)
MCV: 90.9 fL (ref 80.0–100.0)
Monocytes Absolute: 0.5 10*3/uL (ref 0.1–1.0)
Monocytes Relative: 7 %
Neutro Abs: 4.6 10*3/uL (ref 1.7–7.7)
Neutrophils Relative %: 60 %
Platelets: 237 10*3/uL (ref 150–400)
RBC: 4.72 MIL/uL (ref 3.87–5.11)
RDW: 12.7 % (ref 11.5–15.5)
WBC: 7.6 10*3/uL (ref 4.0–10.5)
nRBC: 0 % (ref 0.0–0.2)

## 2020-12-21 LAB — TROPONIN I (HIGH SENSITIVITY): Troponin I (High Sensitivity): 4 ng/L (ref <18)

## 2020-12-21 MED ORDER — SODIUM CHLORIDE 0.9 % IV BOLUS
500.0000 mL | Freq: Once | INTRAVENOUS | Status: AC
  Administered 2020-12-21: 500 mL via INTRAVENOUS

## 2020-12-21 MED ORDER — ONDANSETRON 4 MG PO TBDP: ORAL_TABLET | ORAL | 0 refills | Status: DC

## 2020-12-21 MED ORDER — IOHEXOL 350 MG/ML SOLN
100.0000 mL | Freq: Once | INTRAVENOUS | Status: AC | PRN
  Administered 2020-12-21: 100 mL via INTRAVENOUS

## 2020-12-21 MED ORDER — ONDANSETRON 4 MG PO TBDP
4.0000 mg | ORAL_TABLET | Freq: Once | ORAL | Status: AC
  Administered 2020-12-21: 4 mg via ORAL
  Filled 2020-12-21: qty 1

## 2020-12-21 MED ORDER — HYDROMORPHONE HCL 1 MG/ML IJ SOLN
1.0000 mg | Freq: Once | INTRAMUSCULAR | Status: AC
  Administered 2020-12-21: 1 mg via INTRAVENOUS
  Filled 2020-12-21: qty 1

## 2020-12-21 MED ORDER — ONDANSETRON HCL 4 MG/2ML IJ SOLN
4.0000 mg | Freq: Once | INTRAMUSCULAR | Status: AC
  Administered 2020-12-21: 4 mg via INTRAVENOUS
  Filled 2020-12-21: qty 2

## 2020-12-21 NOTE — ED Provider Notes (Signed)
[facility] Provider Note   CSN: [id number] Arrival date & time: 12/21/20  1835     History Chief Complaint  Patient presents with   Shortness of Breath    Kimberly Mann is a 65 y.o. female with PMHx HTN, Vertigo, and breast cancer s/p resection 2018 who presents to the ED today with complaint of gradual onset, constant, worsening, shortness of breath s/p vocal cord surgery that she had done on 08/10. Pt reports that since being released from the day surgery she has felt short of breath. She began experiencing chest pain last week with some radiation to the left arm causing concern. Pt mentions she has also noticed some swelling to her left arm. She does not believe she had an IV placed in the left arm from the surgery. Pt reports she began experiencing worsening symptoms today prompting ED visit. She did see her surgeon earlier this week and mentioned her symptoms to him; she states he listened to her heart and her lungs and everything seemed okay. Pt denies hx DVT/PE.   The history is provided by the patient and medical records.      Past Medical History:  Diagnosis Date   Anxiety    Cancer ([place])    breast   Chronic back pain    takes oxycodone   Complication of anesthesia    Eczema    Environmental allergies    Headache    Hypertension    PONV (postoperative nausea and vomiting)    Vertigo     Patient Active Problem List   Diagnosis Date Noted   Subacromial bursitis of both shoulders 09/07/2020   Migraine without aura and without status migrainosus, not intractable 12/22/2019   Non-intractable vomiting 08/25/2019   Chronic rhinitis 03/20/2018   Adverse food reaction 03/20/2018   Drug reaction 03/20/2018   Seasonal allergies 01/22/2018   Breast cancer ([place]) 12/11/2017   Vertigo 06/16/2017   Essential hypertension 06/16/2017   Trochanteric bursitis, left hip 01/20/2017   Trochanteric bursitis, right hip 09/18/2016   Ear pain,  bilateral 09/18/2016   Back pain, lumbosacral 05/20/2016   Bilateral shoulder pain 09/13/2015   Trochanteric bursitis of left hip 05/17/2015   Bilateral sciatica 05/17/2015   Subacromial bursitis 12/13/2014   Osteoarthritis of left shoulder 05/17/2014   Neck pain 05/17/2014   Chronic midline low back pain without sciatica 05/17/2014   Migraine 05/17/2014    Past Surgical History:  Procedure Laterality Date   BREAST REDUCTION SURGERY Left 11/20/2017   Procedure: left breast reduction for symmetry and liposuction bilateral lateral breasts;  Surgeon: Wallace Going, DO;  Location: [facility];  Service: Plastics;  Laterality: Left;   BREAST SURGERY     rt breast lumpectomy   LIPOSUCTION     TUBAL LIGATION       OB History     Gravida  1   Para  1   Term  1   Preterm      AB      Living         SAB      IAB      Ectopic      Multiple      Live Births  1           Family History  Problem Relation Age of Onset   Hypertension Mother     Social History   Tobacco Use   Smoking status: Former    Types: Cigarettes  Quit date: 05/17/1974    Years since quitting: 46.6   Smokeless tobacco: Never  Vaping Use   Vaping Use: Never used  Substance Use Topics   Alcohol use: Yes    Alcohol/week: 7.0 standard drinks    Types: 7 Standard drinks or equivalent per week    Comment: one glass of wine with dinner/fim   Drug use: No    Types: Marijuana    Home Medications Prior to Admission medications   Medication Sig Start Date End Date Taking? Authorizing Provider  amlodipine-atorvastatin (CADUET) 2.5-40 MG tablet Take 1 tablet by mouth daily.    [provider]  butalbital-acetaminophen-caffeine [facility]) [phone] MG tablet Take one po qd prn headache 09/07/20   Sater, Nanine Means, MD  diclofenac sodium (VOLTAREN) 1 % GEL Apply 2 g topically 4 (four) times daily as needed. 04/16/18   Sater, Nanine Means, MD  fluconazole (DIFLUCAN) 150  MG tablet Take 1 tablet (150 mg total) by mouth daily. Patient not taking: Reported on 12/04/2020 09/07/20   Britt Bottom, MD  fluticasone [facility]) 50 MCG/ACT nasal spray Use 2 spray(s) in each nostril once daily 12/01/20   Shelda Pal, DO  hydrOXYzine (VISTARIL) 25 MG capsule Take 1 capsule (25 mg total) by mouth 3 (three) times daily as needed. 04/12/19   Sater, Nanine Means, MD  levocetirizine (XYZAL) 5 MG tablet Take 1 tablet (5 mg total) by mouth every evening. 11/02/19   Shelda Pal, DO  lidocaine (XYLOCAINE) 5 % ointment Apply 1 application topically as needed. 09/07/20   Sater, Nanine Means, MD  montelukast (SINGULAIR) 10 MG tablet Take 1 tablet (10 mg total) by mouth at bedtime. Patient not taking: Reported on 12/04/2020 11/02/19   Shelda Pal, DO  Multiple Vitamins-Minerals (MULTIVITAMIN ADULT PO)  03/29/15   [provider]  oxyCODONE-acetaminophen (PERCOCET) 10-325 MG tablet Take 1 tablet by mouth every 6 (six) hours as needed for pain. 12/13/20   Sater, Nanine Means, MD  promethazine (PHENERGAN) 25 MG tablet 25 mg as needed.  03/05/15   [provider]  sucralfate (CARAFATE) 1 GM/10ML suspension Take 10 mLs (1 g total) by mouth 4 (four) times daily -  with meals and at bedtime. Patient not taking: Reported on 12/04/2020 12/22/19   Britt Bottom, MD  tamoxifen (NOLVADEX) 20 MG tablet Take 20 mg by mouth daily.    [provider]  Vitamin D, Ergocalciferol, (DRISDOL) 1.25 MG (50000 UNIT) CAPS capsule Take 1 capsule (50,000 Units total) by mouth once a week. 12/01/20   Shelda Pal, DO    Allergies    Morphine and related, Penicillins, and Sulfa antibiotics  Review of Systems   Review of Systems  Constitutional:  Negative for chills and fever.  Respiratory:  Positive for shortness of breath. Negative for cough.   Cardiovascular:  Positive for chest pain. Negative for leg swelling.  Gastrointestinal:  Positive for nausea.  Negative for vomiting.  Musculoskeletal:  Positive for arthralgias.  All other systems reviewed and are negative.  Physical Exam Updated Vital Signs BP (!) 149/71 (BP Location: Right Arm)   Pulse 73   Temp 98 F (36.7 C) (Oral)   Resp 14   Ht '5\' 5"'$  (1.651 m)   Wt 102 kg   SpO2 95%   BMI 37.42 kg/m   Physical Exam Vitals and nursing note reviewed.  Constitutional:      Appearance: She is not ill-appearing or diaphoretic.  HENT:     Head:  Normocephalic and atraumatic.  Eyes:     Conjunctiva/sclera: Conjunctivae normal.  Cardiovascular:     Rate and Rhythm: Normal rate and regular rhythm.     Pulses: Normal pulses.  Pulmonary:     Effort: Pulmonary effort is normal.     Breath sounds: Normal breath sounds. No decreased breath sounds, wheezing, rhonchi or rales.     Comments: Speaking in full sentences without difficulty. Satting 100% on RA. LCTAB. No stridor.  Chest:     Chest wall: No tenderness.  Abdominal:     Palpations: Abdomen is soft.     Tenderness: There is no abdominal tenderness.  Musculoskeletal:     Cervical back: Neck supple.     Comments: + TTP to left upper arm medially with knot appreciated. No obvious swelling appreciated. 2+ radial pulse.   Skin:    General: Skin is warm and dry.  Neurological:     Mental Status: She is alert.    ED Results / Procedures / Treatments   Labs (all labs ordered are listed, but only abnormal results are displayed) Labs Reviewed  COMPREHENSIVE METABOLIC PANEL - Abnormal; Notable for the following components:      Result Value   Glucose, Bld 112 (*)    Total Bilirubin 0.1 (*)    All other components within normal limits  RESP PANEL BY RT-PCR (FLU A&B, COVID) ARPGX2  CBC WITH DIFFERENTIAL/PLATELET  TROPONIN I (HIGH SENSITIVITY)    EKG None  Radiology DG Chest 2 View  Result Date: 12/21/2020 CLINICAL DATA:  Chest pain and shortness of breath since August 10th after polyp removed from vocal cords. EXAM: CHEST -  2 VIEW COMPARISON:  09/14/2020 FINDINGS: The heart size and mediastinal contours are within normal limits. Both lungs are clear. The visualized skeletal structures are unremarkable. IMPRESSION: No active cardiopulmonary disease. Electronically Signed   By: Lucienne Capers M.D.   On: 12/21/2020 19:16   CT Angio Chest PE W/Cm &/Or Wo Cm  Result Date: 12/21/2020 CLINICAL DATA:  Shortness of breath EXAM: CT ANGIOGRAPHY CHEST WITH CONTRAST TECHNIQUE: Multidetector CT imaging of the chest was performed using the standard protocol during bolus administration of intravenous contrast. Multiplanar CT image reconstructions and MIPs were obtained to evaluate the vascular anatomy. CONTRAST:  148m OMNIPAQUE IOHEXOL 350 MG/ML SOLN COMPARISON:  Chest x-ray from earlier in the same day, 07/21/2009 CT FINDINGS: Cardiovascular: Thoracic aorta and its branches demonstrate atherosclerotic calcification without aneurysmal dilatation or dissection. No cardiac enlargement is seen. Pulmonary artery shows a normal branching pattern. No definitive filling defect is identified to suggest pulmonary embolism. Mild coronary calcifications are noted. Mediastinum/Nodes: Thoracic inlet is within normal limits. No sizable hilar or mediastinal adenopathy is noted. The esophagus as visualized is within normal limits. Lungs/Pleura: Lungs are well aerated bilaterally. There is a 5 mm nodule best seen on image number 48 of series 5 in the right middle lobe. This is stable from a prior exam dated 07/21/2009 and no further follow-up is recommended. No other focal parenchymal nodule is seen. No effusion or focal infiltrate is seen. Upper Abdomen: Visualized upper abdomen shows no acute abnormality. Musculoskeletal: No chest wall abnormality. No acute or significant osseous findings. Review of the MIP images confirms the above findings. IMPRESSION: No evidence of pulmonary emboli. 5 mm right middle lobe nodule stable in appearance from 20/11. No further  follow-up is necessary. No other focal abnormality is noted. Aortic Atherosclerosis (ICD10-I70.0). Electronically Signed   By: MInez CatalinaM.D.   On:  12/21/2020 22:45   US Venous Img Upper Left (DVT Study)  Result Date: 12/21/2020 CLINICAL DATA:  Left arm pain and swelling. EXAM: Left UPPER EXTREMITY VENOUS DOPPLER ULTRASOUND TECHNIQUE: Gray-scale sonography with graded compression, as well as color Doppler and duplex ultrasound were performed to evaluate the upper extremity deep venous system from the level of the subclavian vein and including the jugular, axillary, basilic, radial, ulnar and upper cephalic vein. Spectral Doppler was utilized to evaluate flow at rest and with distal augmentation maneuvers. COMPARISON:  None. FINDINGS: Contralateral Subclavian Vein: Respiratory phasicity is normal and symmetric with the symptomatic side. No evidence of thrombus. Normal compressibility. Internal Jugular Vein: No evidence of thrombus. Normal compressibility, respiratory phasicity and response to augmentation. Subclavian Vein: No evidence of thrombus. Normal compressibility, respiratory phasicity and response to augmentation. Axillary Vein: No evidence of thrombus. Normal compressibility, respiratory phasicity and response to augmentation. Cephalic Vein: No evidence of thrombus. Normal compressibility, respiratory phasicity and response to augmentation. Basilic Vein: No evidence of thrombus. Normal compressibility, respiratory phasicity and response to augmentation. Brachial Veins: No evidence of thrombus. Normal compressibility, respiratory phasicity and response to augmentation. Radial Veins: No evidence of thrombus. Normal compressibility, respiratory phasicity and response to augmentation. Ulnar Veins: No evidence of thrombus. Normal compressibility, respiratory phasicity and response to augmentation. Venous Reflux:  None visualized. Other Findings:  None visualized. IMPRESSION: No evidence of DVT within the  visualized upper extremity veins. Electronically Signed   By: Lucienne Capers M.D.   On: 12/21/2020 20:48    Procedures Procedures   Medications Ordered in ED Medications  ondansetron (ZOFRAN-ODT) disintegrating tablet 4 mg (has no administration in time range)  ondansetron (ZOFRAN) injection 4 mg (4 mg Intravenous Given 12/21/20 2100)  HYDROmorphone (DILAUDID) injection 1 mg (1 mg Intravenous Given 12/21/20 2100)  sodium chloride 0.9 % bolus 500 mL ( Intravenous Stopped 12/21/20 2131)  iohexol (OMNIPAQUE) 350 MG/ML injection 100 mL (100 mLs Intravenous Contrast Given 12/21/20 2227)    ED Course  I have reviewed the triage vital signs and the nursing notes.  Pertinent labs & imaging results that were available during my care of the patient were reviewed by me and considered in my medical decision making (see chart for details).    MDM Rules/Calculators/A&P                           65 year old female presenting to the ED today with complaint of SOB s/p vocal cord polyp resection on 08/10. No having chest pain and pain to left upper arm with some swelling that she has appreciated. Hx of breast cancer in 2018 s/p resection. On arrival to the ED VSS. EKG unchanged from previous. CXR Clear. On my exam she is speaking in full sentences without difficulty; slightly soft voice which she states has been since surgery. NO stridor. LCTAB. She has a small knot appreciated to left upper arm however no swelling appreciated with good distal pulses. Given hx of breast cancer and recent surgery concern for possible PE? DO not feel D dimer is appropriate given hx of cancer. Will plan for CTA. Will also obtain labs including troponin and DVT study of LUE.   DVT study  negative CBC without leukocytosis and hgb stable at 14.1 CMP without electrolyte abnormalities Troponin of 4. Given chest pain has been present for > 1 week do not feel she requires repeat troponin testing  CTA: IMPRESSION:  No evidence of  pulmonary emboli.  5 mm right middle lobe nodule stable in appearance from 20/11. No  further follow-up is necessary.     No other focal abnormality is noted.     Aortic Atherosclerosis (ICD10-I70.0).   Workup overall reassuring at this time. Will discharge pt home at this time. She is instructed to follow up with her surgeon regarding continued shortness of breath s/2 intubation/surgery. She may need further eval for vocal cord dysfunction. Pt is requesting COVID test prior to discharge; will obtain. Do not feel she needs to await results. She is instructed to follow up via MyChart. She is in agreement with plan and stable for discharge home.   This note was prepared using Dragon voice recognition software and may include unintentional dictation errors due to the inherent limitations of voice recognition software.   Final Clinical Impression(s) / ED Diagnoses Final diagnoses:  SOB (shortness of breath)  Nonspecific chest pain    Rx / DC Orders ED Discharge Orders     None        Discharge Instructions      Your workup was very reassuring in the ED without any new abnormalities appreciated on the CT scan. It is recommended that you follow up with your surgeon regarding shortness of breath secondary to your surgery.   We have tested you for COVID 19. Please await your COVID results. We will call you if you test positive. You can log into MyChart and check the results that way.  Return to the ED for any new/worsening symptoms       Eustaquio Maize, Hershal Coria 12/21/20 2332    Drenda Freeze, MD 12/27/20 2137

## 2020-12-21 NOTE — ED Notes (Signed)
Provided completed EKG to MD Yao at approximately [place].

## 2020-12-21 NOTE — Discharge Instructions (Addendum)
Your workup was very reassuring in the ED without any new abnormalities appreciated on the CT scan. It is recommended that you follow up with your surgeon regarding shortness of breath secondary to your surgery.   We have tested you for COVID 19. Please await your COVID results. We will call you if you test positive. You can log into MyChart and check the results that way.  Return to the ED for any new/worsening symptoms

## 2020-12-21 NOTE — ED Notes (Signed)
Pt taken to CT scan.

## 2021-01-09 ENCOUNTER — Telehealth: Payer: Self-pay | Admitting: Neurology

## 2021-01-09 ENCOUNTER — Telehealth: Payer: Self-pay | Admitting: Family Medicine

## 2021-01-09 DIAGNOSIS — M545 Low back pain, unspecified: Secondary | ICD-10-CM

## 2021-01-09 MED ORDER — OXYCODONE-ACETAMINOPHEN 10-325 MG PO TABS: 1.0000 | ORAL_TABLET | Freq: Four times a day (QID) | ORAL | 0 refills | Status: DC | PRN

## 2021-01-09 NOTE — Telephone Encounter (Signed)
Left message for patient to call back and schedule Medicare Annual Wellness Visit (AWV) in office.  ° °If not able to come in office, please offer to do virtually or by telephone.  Left office number and my jabber #[phone]. ° °Due for AWVI ° °Please schedule at anytime with Nurse Health Advisor. °  °

## 2021-01-09 NOTE — Telephone Encounter (Signed)
Pt is needing a refill request for her oxyCODONE-acetaminophen (PERCOCET) 10-325 MG tablet sent to the [facility] on [address]

## 2021-01-24 ENCOUNTER — Encounter: Payer: Self-pay | Admitting: Neurology

## 2021-01-24 ENCOUNTER — Ambulatory Visit (INDEPENDENT_AMBULATORY_CARE_PROVIDER_SITE_OTHER): Payer: Medicare HMO | Admitting: Neurology

## 2021-01-24 VITALS — BP 155/92 | HR 82 | Ht 65.0 in | Wt 226.0 lb

## 2021-01-24 DIAGNOSIS — G8929 Other chronic pain: Secondary | ICD-10-CM

## 2021-01-24 DIAGNOSIS — M7062 Trochanteric bursitis, left hip: Secondary | ICD-10-CM | POA: Diagnosis not present

## 2021-01-24 DIAGNOSIS — G43009 Migraine without aura, not intractable, without status migrainosus: Secondary | ICD-10-CM

## 2021-01-24 DIAGNOSIS — M5432 Sciatica, left side: Secondary | ICD-10-CM

## 2021-01-24 DIAGNOSIS — M25511 Pain in right shoulder: Secondary | ICD-10-CM | POA: Diagnosis not present

## 2021-01-24 DIAGNOSIS — M545 Low back pain, unspecified: Secondary | ICD-10-CM | POA: Diagnosis not present

## 2021-01-24 DIAGNOSIS — M7061 Trochanteric bursitis, right hip: Secondary | ICD-10-CM

## 2021-01-24 DIAGNOSIS — M5431 Sciatica, right side: Secondary | ICD-10-CM

## 2021-01-24 DIAGNOSIS — C50919 Malignant neoplasm of unspecified site of unspecified female breast: Secondary | ICD-10-CM

## 2021-01-24 DIAGNOSIS — M25512 Pain in left shoulder: Secondary | ICD-10-CM

## 2021-01-24 MED ORDER — FLUCONAZOLE 150 MG PO TABS: 150.0000 mg | ORAL_TABLET | Freq: Every day | ORAL | 0 refills | Status: DC

## 2021-01-24 MED ORDER — HYDROXYZINE PAMOATE 25 MG PO CAPS: 25.0000 mg | ORAL_CAPSULE | Freq: Three times a day (TID) | ORAL | 2 refills | Status: DC | PRN

## 2021-01-24 MED ORDER — OXYCODONE-ACETAMINOPHEN 10-325 MG PO TABS: 1.0000 | ORAL_TABLET | Freq: Four times a day (QID) | ORAL | 0 refills | Status: DC | PRN

## 2021-01-24 NOTE — Patient Instructions (Signed)
I asked her to discus osteopenia with PCP   She may benefit form a bisphosphonate or other medication

## 2021-01-24 NOTE — Progress Notes (Signed)
t  [facility]  PATIENT: Kimberly Mann DOB: [birth year]  REFERRING CLINICIAN: [name]  HISTORY FROM: Patient  REASON FOR VISIT: Left shoulder and neck pain; back pain [  HISTORICAL  CHIEF COMPLAINT:  Chief Complaint  Patient presents with   Follow-up    Rm 1, last visit 09/07/20, pt alone, following up, she is having pain in the buttocks/leg and hips bilateral.      HISTORY OF PRESENT ILLNESS:  Kimberly Mann is a 65 y.o. woman with neck, back, hip and shoulder pain.  Update 01/24/2021: She has pain in her hips > shoulders.  The worst pain is in the hips, a little more on the right than the left.  P pain worsens with standing or walking.   We have done trochanteric bursae injections with benefit.  Other times piriformis muscle injections have been helpful.  She also reports bilateral shoulder pain with some neck pain.  In the past, shoulder pain has improved after subacromial bursa injections.  She had a bone density test showing osteopenia.   She does get steroid injection 3 times a year (usually 80 mg Depo-Medrol) and a steroid pack a few times a year.    The shots help her more than the steroid packs.  We discussed that we need to continue to try to limit the injections to just a couple each year  She has migraine headaches twice a week..  Imitrex has not worked in the past.  Fioricet has helped her some in the past but she has not taken for a couple years.  She has some depression but is doing better than earlier in the year.  Her mother died in 07-20-18.  She has breast cancer,  She is done with RadRx and still is on tamoxifen.    She sometimes notes chest pain of the breast       MRI Imaging: MRI cervical spine 03/06/2017:  Shallow broad-based central protrusion at C4-5 slightly deforms the  cord but the central canal and foramina are open.    Mild right foraminal narrowing at C5-6 due to uncovertebral  spurring. The central canal and left foramen are open  at this level.    Based on comparison with the report of the prior MRI, the appearance of the cervical spine is unchanged.   MRI brain 10/06/2019 [facility]):  This MRI of the brain with and without contrast with added attention to the internal auditory canals shows the following:   The 8th nerves and the internal auditory canals appear normal. Scattered T2/FLAIR hyperintense foci in the subcortical and deep white matter consistent with chronic microvascular ischemic change.  None of the foci appear to be acute.  There is a normal enhancement pattern and no acute findings.     REVIEW OF SYSTEMS:  Constitutional: No fevers, chills, sweats, or change in appetite Eyes: No visual changes, double vision, eye pain Ear, nose and throat: No hearing loss, ear pain, nasal congestion, sore throat Cardiovascular: No chest pain, palpitations Respiratory:  No shortness of breath at rest or with exertion.   No wheezes GastrointestinaI: No nausea, vomiting, diarrhea, abdominal pain, fecal incontinence Genitourinary:  No dysuria, urinary retention or frequency.  No nocturia. Musculoskeletal: as above Integumentary: No rash, pruritus, skin lesions Neurological: as above Psychiatric: She has som depression since breast cancer dx.  Endocrine: No palpitations, diaphoresis, change in appetite, change in weigh or increased thirst  ALLERGIES: Allergies  Allergen Reactions   Morphine And Related Nausea And Vomiting  and Swelling   Penicillins    Sulfa Antibiotics     HOME MEDICATIONS: Outpatient Medications Prior to Visit  Medication Sig Dispense Refill   amlodipine-atorvastatin (CADUET) 2.5-40 MG tablet Take 1 tablet by mouth daily.     butalbital-acetaminophen-caffeine (FIORICET) 50-325-40 MG tablet Take one po qd prn headache 15 tablet 3   diclofenac sodium (VOLTAREN) 1 % GEL Apply 2 g topically 4 (four) times daily as needed. 200 g 5   fluticasone (FLONASE) 50 MCG/ACT nasal spray Use 2 spray(s) in  each nostril once daily 16 g 0   levocetirizine (XYZAL) 5 MG tablet Take 1 tablet (5 mg total) by mouth every evening. 90 tablet 2   lidocaine (XYLOCAINE) 5 % ointment Apply 1 application topically as needed. 35.44 g 5   montelukast (SINGULAIR) 10 MG tablet Take 1 tablet (10 mg total) by mouth at bedtime. 30 tablet 3   Multiple Vitamins-Minerals (MULTIVITAMIN ADULT PO)      ondansetron (ZOFRAN ODT) 4 MG disintegrating tablet 4mg  ODT q4 hours prn nausea/vomit 10 tablet 0   promethazine (PHENERGAN) 25 MG tablet 25 mg as needed.      tamoxifen (NOLVADEX) 20 MG tablet Take 20 mg by mouth daily.     Vitamin D, Ergocalciferol, (DRISDOL) 1.25 MG (50000 UNIT) CAPS capsule Take 1 capsule (50,000 Units total) by mouth once a week. 4 capsule 3   hydrOXYzine (VISTARIL) 25 MG capsule Take 1 capsule (25 mg total) by mouth 3 (three) times daily as needed. 60 capsule 1   oxyCODONE-acetaminophen (PERCOCET) 10-325 MG tablet Take 1 tablet by mouth every 6 (six) hours as needed for pain. 120 tablet 0   fluconazole (DIFLUCAN) 150 MG tablet Take 1 tablet (150 mg total) by mouth daily. (Patient not taking: Reported on 12/04/2020) 15 tablet 0   sucralfate (CARAFATE) 1 GM/10ML suspension Take 10 mLs (1 g total) by mouth 4 (four) times daily -  with meals and at bedtime. (Patient not taking: Reported on 12/04/2020) 420 mL 1   No facility-administered medications prior to visit.    PAST MEDICAL HISTORY: Past Medical History:  Diagnosis Date   Anxiety    Cancer ([place])    breast   Chronic back pain    takes oxycodone   Complication of anesthesia    Eczema    Environmental allergies    Headache    Hypertension    PONV (postoperative nausea and vomiting)    Vertigo     PAST SURGICAL HISTORY: Past Surgical History:  Procedure Laterality Date   BREAST REDUCTION SURGERY Left 11/20/2017   Procedure: left breast reduction for symmetry and liposuction bilateral lateral breasts;  Surgeon: Wallace Going, DO;   Location: [facility];  Service: Plastics;  Laterality: Left;   BREAST SURGERY     rt breast lumpectomy   LIPOSUCTION     TUBAL LIGATION      FAMILY HISTORY: Family History  Problem Relation Age of Onset   Hypertension Mother     SOCIAL HISTORY:  Social History   Socioeconomic History   Marital status: Legally Separated    Spouse name: Not on file   Number of children: Not on file   Years of education: Not on file   Highest education level: Not on file  Occupational History   Not on file  Tobacco Use   Smoking status: Former    Types: Cigarettes    Quit date: 05/17/1974    Years since quitting: 46.7   Smokeless  tobacco: Never  Vaping Use   Vaping Use: Never used  Substance and Sexual Activity   Alcohol use: Yes    Alcohol/week: 7.0 standard drinks    Types: 7 Standard drinks or equivalent per week    Comment: one glass of wine with dinner/fim   Drug use: No    Types: Marijuana   Sexual activity: Not on file  Other Topics Concern   Not on file  Social History Narrative   Not on file   Social Determinants of Health   Financial Resource Strain: Not on file  Food Insecurity: Not on file  Transportation Needs: Not on file  Physical Activity: Not on file  Stress: Not on file  Social Connections: Not on file  Intimate Partner Violence: Not on file     PHYSICAL EXAM  Vitals:   01/24/21 1141  BP: (!) 155/92  Pulse: 82  Weight: 226 lb (102.5 kg)  Height: 5\' 5"  (1.651 m)    Body mass index is 37.61 kg/m.   General: The patient is well-developed and well-nourished and in no acute distress   Musculoskeletal:   She is tender over bilateral subacromial bursae,, right greater than left piriformis muscles and R > L and bilateral trochanteric bursae.  Neurologic Exam  Mental status: The patient is alert and oriented x 3 at the time of the examination. The patient has apparent normal recent and remote memory, with an apparently normal attention  span and concentration ability.    Cranial nerves:    Facial strength is normal.  Trapezius strength is normal.  No dysarthria is noted.    No obvious hearing deficits are noted.  Motor:  Muscle bulk and tone are normal. Strength is 5/5 in the arms or legs.   Gait and station: Station is normal.  She has an arthritic gait.  Her tandem gait is wide.  Romberg is negative. DTRs:   Reflexes are normal and symmetric in the arms and legs.     ASSESSMENT AND PLAN   1. Chronic pain of both shoulders   2. Back pain, lumbosacral   3. Trochanteric bursitis, left hip   4. Trochanteric bursitis, right hip   5. Bilateral sciatica   6. Migraine without aura and without status migrainosus, not intractable   7. Malignant neoplasm of female breast, unspecified estrogen receptor status, unspecified laterality, unspecified site of breast ([place])      1.   Injection of bilateral trochanteric bursae with 30 mg Depo-Medrol in 4 cc lidocaine using sterile technique..  She tolerated the injections well and pain was better afterwards. 2.   Injection of bilateral subacromial bursae with 30 mg Depo-Medrol in 4 cc lidocaine using sterile technique..  She tolerated the injections well and pain was better afterwards. 3.   Trigger point injection of bilateral piriformis muscles with 20 mg Depo-Medrol in 4 cc lidocaine continue  4.   Renew Percocet.  The [place] controlled substance database was reviewed.  The patient has had yeast infections after some fungal injections, I will send in a prescription for Diflucan. 5.   She has osteopenia.  We have been averaging 1 steroid injection every 4 months and she reports she may get steroid 1 or 2 other x20-year.  I have asked her to discuss her osteopenia with her primary care practice as she may benefit from treatment with a bisphosphonate or other medication. 4.   She will return to see me in 4 months, sooner if she  has new  or worsening neurologic symptoms.     Jalayiah Bibian A. Felecia Shelling, MD, PhD 06/08/3126, 11:88 PM Certified in Neurology, Clinical Neurophysiology, Sleep Medicine, Pain Medicine and Neuroimaging  [facility] [address], [place], [place] [address] [phone]

## 2021-02-01 ENCOUNTER — Ambulatory Visit (INDEPENDENT_AMBULATORY_CARE_PROVIDER_SITE_OTHER): Payer: Medicare HMO

## 2021-02-01 VITALS — Ht 65.0 in | Wt 226.0 lb

## 2021-02-01 DIAGNOSIS — Z0000 Encounter for general adult medical examination without abnormal findings: Secondary | ICD-10-CM

## 2021-02-01 NOTE — Patient Instructions (Signed)
[name] , Thank you for taking time to complete your Medicare Wellness Visit. I appreciate your ongoing commitment to your health goals. Please review the following plan we discussed and let me know if I can assist you in the future.   Screening recommendations/referrals: Colonoscopy: Completed 11/02/2018-Due 11/01/2028 Mammogram: Completed 09/2020-Due 09/2021 Bone Density: Completed 11/16/2019-Due 11/15/2021 Recommended yearly ophthalmology/optometry visit for glaucoma screening and checkup Recommended yearly dental visit for hygiene and checkup  Vaccinations: Influenza vaccine: Due- May obtain vaccine at our office or your local pharmacy. Pneumococcal vaccine: [place] for Prevnar. May obtain vaccine at our office or your local pharmacy. Tdap vaccine: Up to date-Due-07/24/2027 Shingles vaccine: Discuss with pharmacy.   Covid-19:Booster available at the pharmacy.  Advanced directives: Declined information today  Conditions/risks identified: See problem list  Next appointment: Follow up in one year for your annual wellness visit    Preventive Care 65 Years and Older, Female Preventive care refers to lifestyle choices and visits with your health care provider that can promote health and wellness. What does preventive care include? A yearly physical exam. This is also called an annual well check. Dental exams once or twice a year. Routine eye exams. Ask your health care provider how often you should have your eyes checked. Personal lifestyle choices, including: Daily care of your teeth and gums. Regular physical activity. Eating a healthy diet. Avoiding tobacco and drug use. Limiting alcohol use. Practicing safe sex. Taking low-dose aspirin every day. Taking vitamin and mineral supplements as recommended by your health care provider. What happens during an annual well check? The services and screenings done by your health care provider during your annual well check will depend on your  age, overall health, lifestyle risk factors, and family history of disease. Counseling  Your health care provider may ask you questions about your: Alcohol use. Tobacco use. Drug use. Emotional well-being. Home and relationship well-being. Sexual activity. Eating habits. History of falls. Memory and ability to understand (cognition). Work and work Statistician. Reproductive health. Screening  You may have the following tests or measurements: Height, weight, and BMI. Blood pressure. Lipid and cholesterol levels. These may be checked every 5 years, or more frequently if you are over 40 years old. Skin check. Lung cancer screening. You may have this screening every year starting at age 39 if you have a 30-pack-year history of smoking and currently smoke or have quit within the past 15 years. Fecal occult blood test (FOBT) of the stool. You may have this test every year starting at age 4. Flexible sigmoidoscopy or colonoscopy. You may have a sigmoidoscopy every 5 years or a colonoscopy every 10 years starting at age 45. Hepatitis C blood test. Hepatitis B blood test. Sexually transmitted disease (STD) testing. Diabetes screening. This is done by checking your blood sugar (glucose) after you have not eaten for a while (fasting). You may have this done every 1-3 years. Bone density scan. This is done to screen for osteoporosis. You may have this done starting at age 48. Mammogram. This may be done every 1-2 years. Talk to your health care provider about how often you should have regular mammograms. Talk with your health care provider about your test results, treatment options, and if necessary, the need for more tests. Vaccines  Your health care provider may recommend certain vaccines, such as: Influenza vaccine. This is recommended every year. Tetanus, diphtheria, and acellular pertussis (Tdap, Td) vaccine. You may need a Td booster every 10 years. Zoster vaccine. You may need  this after  age 55. Pneumococcal 13-valent conjugate (PCV13) vaccine. One dose is recommended after age 37. Pneumococcal polysaccharide (PPSV23) vaccine. One dose is recommended after age 59. Talk to your health care provider about which screenings and vaccines you need and how often you need them. This information is not intended to replace advice given to you by your health care provider. Make sure you discuss any questions you have with your health care provider. Document Released: 05/12/2015 Document Revised: 01/03/2016 Document Reviewed: 02/14/2015 Elsevier Interactive Patient Education  2017 [place] Prevention in the Home Falls can cause injuries. They can happen to people of all ages. There are many things you can do to make your home safe and to help prevent falls. What can I do on the outside of my home? Regularly fix the edges of walkways and driveways and fix any cracks. Remove anything that might make you trip as you walk through a door, such as a raised step or threshold. Trim any bushes or trees on the path to your home. Use bright outdoor lighting. Clear any walking paths of anything that might make someone trip, such as rocks or tools. Regularly check to see if handrails are loose or broken. Make sure that both sides of any steps have handrails. Any raised decks and porches should have guardrails on the edges. Have any leaves, snow, or ice cleared regularly. Use sand or salt on walking paths during winter. Clean up any spills in your garage right away. This includes oil or grease spills. What can I do in the bathroom? Use night lights. Install grab bars by the toilet and in the tub and shower. Do not use towel bars as grab bars. Use non-skid mats or decals in the tub or shower. If you need to sit down in the shower, use a plastic, non-slip stool. Keep the floor dry. Clean up any water that spills on the floor as soon as it happens. Remove soap buildup in the tub or shower  regularly. Attach bath mats securely with double-sided non-slip rug tape. Do not have throw rugs and other things on the floor that can make you trip. What can I do in the bedroom? Use night lights. Make sure that you have a light by your bed that is easy to reach. Do not use any sheets or blankets that are too big for your bed. They should not hang down onto the floor. Have a firm chair that has side arms. You can use this for support while you get dressed. Do not have throw rugs and other things on the floor that can make you trip. What can I do in the kitchen? Clean up any spills right away. Avoid walking on wet floors. Keep items that you use a lot in easy-to-reach places. If you need to reach something above you, use a strong step stool that has a grab bar. Keep electrical cords out of the way. Do not use floor polish or wax that makes floors slippery. If you must use wax, use non-skid floor wax. Do not have throw rugs and other things on the floor that can make you trip. What can I do with my stairs? Do not leave any items on the stairs. Make sure that there are handrails on both sides of the stairs and use them. Fix handrails that are broken or loose. Make sure that handrails are as long as the stairways. Check any carpeting to make sure that it is firmly attached to  the stairs. Fix any carpet that is loose or worn. Avoid having throw rugs at the top or bottom of the stairs. If you do have throw rugs, attach them to the floor with carpet tape. Make sure that you have a light switch at the top of the stairs and the bottom of the stairs. If you do not have them, ask someone to add them for you. What else can I do to help prevent falls? Wear shoes that: Do not have high heels. Have rubber bottoms. Are comfortable and fit you well. Are closed at the toe. Do not wear sandals. If you use a stepladder: Make sure that it is fully opened. Do not climb a closed stepladder. Make sure that  both sides of the stepladder are locked into place. Ask someone to hold it for you, if possible. Clearly mark and make sure that you can see: Any grab bars or handrails. First and last steps. Where the edge of each step is. Use tools that help you move around (mobility aids) if they are needed. These include: Canes. Walkers. Scooters. Crutches. Turn on the lights when you go into a dark area. Replace any light bulbs as soon as they burn out. Set up your furniture so you have a clear path. Avoid moving your furniture around. If any of your floors are uneven, fix them. If there are any pets around you, be aware of where they are. Review your medicines with your doctor. Some medicines can make you feel dizzy. This can increase your chance of falling. Ask your doctor what other things that you can do to help prevent falls. This information is not intended to replace advice given to you by your health care provider. Make sure you discuss any questions you have with your health care provider. Document Released: 02/09/2009 Document Revised: 09/21/2015 Document Reviewed: 05/20/2014 Elsevier Interactive Patient Education  2017 [employer].

## 2021-02-01 NOTE — Progress Notes (Signed)
Subjective:   Kimberly Mann is a 64 y.o. female who presents for an Initial Medicare Annual Wellness Visit.  I connected with Ariam today by telephone and verified that I am speaking with the correct person using two identifiers. Location patient: home Location provider: work Persons participating in the virtual visit: patient, Marine scientist.    I discussed the limitations, risks, security and privacy concerns of performing an evaluation and management service by telephone and the availability of in person appointments. I also discussed with the patient that there may be a patient responsible charge related to this service. The patient expressed understanding and verbally consented to this telephonic visit.    Interactive audio and video telecommunications were attempted between this provider and patient, however failed, due to patient having technical difficulties OR patient did not have access to video capability.  We continued and completed visit with audio only.  Some vital signs may be absent or patient reported.   Time Spent with patient on telephone encounter: 20 minutes   Review of Systems     Cardiac Risk Factors include: advanced age (>33men, >36 women);hypertension;sedentary lifestyle;obesity (BMI >30kg/m2)     Objective:    Today's Vitals   02/01/21 1144  Weight: 226 lb (102.5 kg)  Height: 5\' 5"  (1.651 m)   Body mass index is 37.61 kg/m.  Advanced Directives 02/01/2021 09/14/2020 11/26/2019 03/28/2019 03/22/2019 06/21/2018 11/20/2017  Does Patient Have a Medical Advance Directive? No No No No No No No  Would patient like information on creating a medical advance directive? No - Patient declined No - Patient declined - - - - No - Patient declined    Current Medications (verified) Outpatient Encounter Medications as of 02/01/2021  Medication Sig   amlodipine-atorvastatin (CADUET) 2.5-40 MG tablet Take 1 tablet by mouth daily.   butalbital-acetaminophen-caffeine  (FIORICET) 50-325-40 MG tablet Take one po qd prn headache   diclofenac sodium (VOLTAREN) 1 % GEL Apply 2 g topically 4 (four) times daily as needed.   fluconazole (DIFLUCAN) 150 MG tablet Take 1 tablet (150 mg total) by mouth daily.   fluticasone (FLONASE) 50 MCG/ACT nasal spray Use 2 spray(s) in each nostril once daily   hydrOXYzine (VISTARIL) 25 MG capsule Take 1 capsule (25 mg total) by mouth 3 (three) times daily as needed.   levocetirizine (XYZAL) 5 MG tablet Take 1 tablet (5 mg total) by mouth every evening.   lidocaine (XYLOCAINE) 5 % ointment Apply 1 application topically as needed.   montelukast (SINGULAIR) 10 MG tablet Take 1 tablet (10 mg total) by mouth at bedtime.   Multiple Vitamins-Minerals (MULTIVITAMIN ADULT PO)    ondansetron (ZOFRAN ODT) 4 MG disintegrating tablet 4mg  ODT q4 hours prn nausea/vomit   oxyCODONE-acetaminophen (PERCOCET) 10-325 MG tablet Take 1 tablet by mouth every 6 (six) hours as needed for pain.   promethazine (PHENERGAN) 25 MG tablet 25 mg as needed.    tamoxifen (NOLVADEX) 20 MG tablet Take 20 mg by mouth daily.   Vitamin D, Ergocalciferol, (DRISDOL) 1.25 MG (50000 UNIT) CAPS capsule Take 1 capsule (50,000 Units total) by mouth once a week.   No facility-administered encounter medications on file as of 02/01/2021.    Allergies (verified) Morphine and related, Penicillins, and Sulfa antibiotics   History: Past Medical History:  Diagnosis Date   Anxiety    Cancer ([place])    breast   Chronic back pain    takes oxycodone   Complication of anesthesia    Eczema    Environmental allergies  Headache    Hypertension    PONV (postoperative nausea and vomiting)    Vertigo    Past Surgical History:  Procedure Laterality Date   BREAST REDUCTION SURGERY Left 11/20/2017   Procedure: left breast reduction for symmetry and liposuction bilateral lateral breasts;  Surgeon: Wallace Going, DO;  Location: [facility];  Service: Plastics;   Laterality: Left;   BREAST SURGERY     rt breast lumpectomy   LIPOSUCTION     TUBAL LIGATION     Family History  Problem Relation Age of Onset   Hypertension Mother    Social History   Socioeconomic History   Marital status: Legally Separated    Spouse name: Not on file   Number of children: Not on file   Years of education: Not on file   Highest education level: Not on file  Occupational History   Not on file  Tobacco Use   Smoking status: Former    Types: Cigarettes    Quit date: 05/17/1974    Years since quitting: 46.7   Smokeless tobacco: Never  Vaping Use   Vaping Use: Never used  Substance and Sexual Activity   Alcohol use: Not Currently    Alcohol/week: 7.0 standard drinks    Types: 7 Standard drinks or equivalent per week   Drug use: No    Types: Marijuana   Sexual activity: Not on file  Other Topics Concern   Not on file  Social History Narrative   Not on file   Social Determinants of Health   Financial Resource Strain: Low Risk    Difficulty of Paying Living Expenses: Not hard at all  Food Insecurity: No Food Insecurity   Worried About Charity fundraiser in the Last Year: Never true   [place] in the Last Year: Never true  Transportation Needs: No Transportation Needs   Lack of Transportation (Medical): No   Lack of Transportation (Non-Medical): No  Physical Activity: Inactive   Days of Exercise per Week: 0 days   Minutes of Exercise per Session: 0 min  Stress: No Stress Concern Present   Feeling of Stress : Not at all  Social Connections: Moderately Isolated   Frequency of Communication with Friends and Family: More than three times a week   Frequency of Social Gatherings with Friends and Family: More than three times a week   Attends Religious Services: More than 4 times per year   Active Member of [employer] or Organizations: No   Attends Archivist Meetings: Never   Marital Status: Separated    Tobacco Counseling Counseling  given: Not Answered   Clinical Intake:  Pre-visit preparation completed: Yes  Pain : No/denies pain     BMI - recorded: 37.61 Nutritional Status: BMI > 30  Obese Nutritional Risks: None Diabetes: No  How often do you need to have someone help you when you read instructions, pamphlets, or other written materials from your doctor or pharmacy?: 1 - Never  Diabetic?No  Interpreter Needed?: No  Information entered by :: Lucia Estelle LPN   Activities of Daily Living In your present state of health, do you have any difficulty performing the following activities: 02/01/2021 12/01/2020  Hearing? N N  Vision? N N  Difficulty concentrating or making decisions? N N  Walking or climbing stairs? Y N  Comment occasionally when she has vertigo -  Dressing or bathing? N N  Doing errands, shopping? N N  Conservation officer, nature and  eating ? N -  Using the Toilet? N -  In the past six months, have you accidently leaked urine? N -  Do you have problems with loss of bowel control? N -  Managing your Medications? N -  Managing your Finances? N -  Housekeeping or managing your Housekeeping? N -  Some recent data might be hidden    Patient Care Team: Shelda Pal, DO as PCP - General (Family Medicine)  Indicate any recent Medical Services you may have received from other than [facility] providers in the past year (date may be approximate).     Assessment:   This is a routine wellness examination for Kimberly Mann.  Hearing/Vision screen Hearing Screening - Comments:: No issues Vision Screening - Comments:: Last eye exam-10/2019-Dr. Tamias-has upcoming appt 03/03/21  Dietary issues and exercise activities discussed: Current Exercise Habits: The patient does not participate in regular exercise at present, Exercise limited by: Other - see comments (vertigo)   Goals Addressed             This Visit's Progress    Patient Stated       Just focusing on getting well       Depression  Screen PHQ 2/9 Scores 02/01/2021 12/01/2020 11/02/2019  PHQ - 2 Score 0 0 0    Fall Risk Fall Risk  02/01/2021  Falls in the past year? 1  Number falls in past yr: 1  Injury with Fall? 0  Risk for fall due to : History of fall(s)  Follow up Falls prevention discussed    [place]:  Any stairs in or around the home? No  Home free of loose throw rugs in walkways, pet beds, electrical cords, etc? Yes  Adequate lighting in your home to reduce risk of falls? Yes   ASSISTIVE DEVICES UTILIZED TO PREVENT FALLS:  Life alert? No  Use of a cane, walker or w/c? Yes  Grab bars in the bathroom? Yes  Shower chair or bench in shower? Yes  Elevated toilet seat or a handicapped toilet? No   TIMED UP AND GO:  Was the test performed? No . Phone visit   Cognitive Function:Normal cognitive status assessed by  this Nurse Health Advisor. No abnormalities found.          Immunizations Immunization History  Administered Date(s) Administered   [employer] Vaccination 08/31/2019, 09/28/2019   Pneumococcal Polysaccharide-23 11/02/2019   Tdap 07/23/2017    TDAP status: Up to date  Flu Vaccine status: Due, Education has been provided regarding the importance of this vaccine. Advised may receive this vaccine at local pharmacy or Health Dept. Aware to provide a copy of the vaccination record if obtained from local pharmacy or Health Dept. Verbalized acceptance and understanding.  Pneumococcal vaccine status: Due, Education has been provided regarding the importance of this vaccine. Advised may receive this vaccine at local pharmacy or Health Dept. Aware to provide a copy of the vaccination record if obtained from local pharmacy or Health Dept. Verbalized acceptance and understanding.  Covid-19 vaccine status: Information provided on how to obtain vaccines. Booster due  Qualifies for Shingles Vaccine? Yes   Zostavax completed No   Shingrix Completed?: No.     Education has been provided regarding the importance of this vaccine. Patient has been advised to call insurance company to determine out of pocket expense if they have not yet received this vaccine. Advised may also receive vaccine at local pharmacy or Health Dept. Verbalized acceptance  and understanding.  Screening Tests Health Maintenance  Topic Date Due   URINE MICROALBUMIN  Never done   INFLUENZA VACCINE  Never done   Zoster Vaccines- Shingrix (1 of 2) 03/03/2021 (Originally 07/06/1974)   TETANUS/TDAP  07/24/2027   COLONOSCOPY (Pts 45-26yrs Insurance coverage will need to be confirmed)  11/01/2028   DEXA SCAN  Completed   Hepatitis C Screening  Completed   HPV VACCINES  Aged Out   MAMMOGRAM  Discontinued   COVID-19 Vaccine  Discontinued    Health Maintenance  Health Maintenance Due  Topic Date Due   URINE MICROALBUMIN  Never done   INFLUENZA VACCINE  Never done    Colorectal cancer screening: Type of screening: Colonoscopy. Completed 11/02/2018. Repeat every 10 years  Mammogram status: Completed bilateral 10/25/2020 at [facility]. Repeat every year  Bone Density status: Completed 11/16/2019. Results reflect: Bone density results: OSTEOPENIA. Repeat every 2 years.  Lung Cancer Screening: (Low Dose CT Chest recommended if Age 64-80 years, 30 pack-year currently smoking OR have quit w/in 15years.) does not qualify.    Additional Screening:  Hepatitis C Screening: Completed 04/01/2018  Vision Screening: Recommended annual ophthalmology exams for early detection of glaucoma and other disorders of the eye. Is the patient up to date with their annual eye exam?  Yes  Who is the provider or what is the name of the office in which the patient attends annual eye exams? Dr. Ella Jubilee   Dental Screening: Recommended annual dental exams for proper oral hygiene  Community Resource Referral / Chronic Care Management: CRR required this visit?  No   CCM required this visit?  No       Plan:     I have personally reviewed and noted the following in the patient's chart:   Medical and social history Use of alcohol, tobacco or illicit drugs  Current medications and supplements including opioid prescriptions. Patient is not currently taking opioid prescriptions. Functional ability and status Nutritional status Physical activity Advanced directives List of other physicians Hospitalizations, surgeries, and ER visits in previous 12 months Vitals Screenings to include cognitive, depression, and falls Referrals and appointments  In addition, I have reviewed and discussed with patient certain preventive protocols, quality metrics, and best practice recommendations. A written personalized care plan for preventive services as well as general preventive health recommendations were provided to patient.   Due to this being a telephonic visit, the after visit summary with patients personalized plan was offered to patient via mail or my-chart. Patient would like to access on my-chart.   Marta Antu, LPN   [id number]  Nurse Health Advisor  Nurse Notes: None

## 2021-02-09 ENCOUNTER — Other Ambulatory Visit: Payer: Self-pay | Admitting: Family Medicine

## 2021-02-14 DIAGNOSIS — N950 Postmenopausal bleeding: Secondary | ICD-10-CM | POA: Diagnosis not present

## 2021-02-14 DIAGNOSIS — Z5181 Encounter for therapeutic drug level monitoring: Secondary | ICD-10-CM | POA: Diagnosis not present

## 2021-02-14 DIAGNOSIS — Z79810 Long term (current) use of selective estrogen receptor modulators (SERMs): Secondary | ICD-10-CM | POA: Diagnosis not present

## 2021-02-14 DIAGNOSIS — C50511 Malignant neoplasm of lower-outer quadrant of right female breast: Secondary | ICD-10-CM | POA: Diagnosis not present

## 2021-02-14 DIAGNOSIS — Z170 Estrogen receptor positive status [ER+]: Secondary | ICD-10-CM | POA: Diagnosis not present

## 2021-02-21 DIAGNOSIS — H81399 Other peripheral vertigo, unspecified ear: Secondary | ICD-10-CM | POA: Diagnosis not present

## 2021-02-26 ENCOUNTER — Ambulatory Visit: Payer: Medicare HMO | Admitting: Family Medicine

## 2021-03-05 DIAGNOSIS — H25013 Cortical age-related cataract, bilateral: Secondary | ICD-10-CM | POA: Diagnosis not present

## 2021-03-05 DIAGNOSIS — H02834 Dermatochalasis of left upper eyelid: Secondary | ICD-10-CM | POA: Diagnosis not present

## 2021-03-05 DIAGNOSIS — H52203 Unspecified astigmatism, bilateral: Secondary | ICD-10-CM | POA: Diagnosis not present

## 2021-03-05 DIAGNOSIS — H43393 Other vitreous opacities, bilateral: Secondary | ICD-10-CM | POA: Diagnosis not present

## 2021-03-05 DIAGNOSIS — H5203 Hypermetropia, bilateral: Secondary | ICD-10-CM | POA: Diagnosis not present

## 2021-03-05 DIAGNOSIS — H2513 Age-related nuclear cataract, bilateral: Secondary | ICD-10-CM | POA: Diagnosis not present

## 2021-03-05 DIAGNOSIS — H40033 Anatomical narrow angle, bilateral: Secondary | ICD-10-CM | POA: Diagnosis not present

## 2021-03-05 DIAGNOSIS — H524 Presbyopia: Secondary | ICD-10-CM | POA: Diagnosis not present

## 2021-03-05 DIAGNOSIS — H02831 Dermatochalasis of right upper eyelid: Secondary | ICD-10-CM | POA: Diagnosis not present

## 2021-03-07 ENCOUNTER — Other Ambulatory Visit: Payer: Self-pay

## 2021-03-07 ENCOUNTER — Other Ambulatory Visit: Payer: Self-pay | Admitting: Family Medicine

## 2021-03-07 ENCOUNTER — Encounter: Payer: Self-pay | Admitting: Obstetrics & Gynecology

## 2021-03-07 ENCOUNTER — Ambulatory Visit (INDEPENDENT_AMBULATORY_CARE_PROVIDER_SITE_OTHER): Payer: Medicare HMO | Admitting: Obstetrics & Gynecology

## 2021-03-07 VITALS — BP 153/89 | HR 90 | Wt 228.0 lb

## 2021-03-07 DIAGNOSIS — Z9229 Personal history of other drug therapy: Secondary | ICD-10-CM

## 2021-03-07 DIAGNOSIS — N924 Excessive bleeding in the premenopausal period: Secondary | ICD-10-CM

## 2021-03-07 DIAGNOSIS — I10 Essential (primary) hypertension: Secondary | ICD-10-CM

## 2021-03-07 NOTE — Progress Notes (Signed)
History:  65 y.o. G1P1000 here today for PMPB. Pt reports that she has had the surgery on her vocal cords and she wants to know when/if she should proceed with the removal of the polyp. Pt reports continued spotting on occ. She is presently sexually active and does not want to interrupt this. Pt denies any new sx.    Pt does report a h/o Tamoxifen use.     The following portions of the patient's history were reviewed and updated as appropriate: allergies, current medications, past family history, past medical history, past social history, past surgical history and problem list.  Review of Systems:  Pertinent items are noted in HPI.    Objective:  Physical Exam Blood pressure (!) 153/89, pulse 90, weight 228 lb (103.4 kg).  CONSTITUTIONAL: Well-developed, well-nourished female in no acute distress.  HENT:  Normocephalic, atraumatic EYES: Conjunctivae and EOM are normal. No scleral icterus.  NECK: Normal range of motion SKIN: Skin is warm and dry. No rash noted. Not diaphoretic.No pallor. [place]: Alert and oriented to person, place, and time. Normal coordination.    Labs and Imaging CLINICAL DATA:  Initial evaluation for postmenopausal bleeding, on tamoxifen.   EXAM: ULTRASOUND PELVIS TRANSVAGINAL   TECHNIQUE: Transvaginal ultrasound examination of the pelvis was performed including evaluation of the uterus, ovaries, adnexal regions, and pelvic cul-de-sac.   COMPARISON:  Prior CT from 07/21/2009.   FINDINGS: Uterus   Measurements: 7.1 x 6.9 x 9.4 cm = volume: 238.3 mL. Large intramural fibroid positioned at the mid uterine body measures 8.5 x 6.5 x 6.4 cm.   Endometrium   Thickness: 16.3 mm. Endometrial complex is mildly heterogeneous without discrete focal abnormality. No appreciable associated vascularity.   Right ovary   Not visualized.  No adnexal mass.   Left ovary   Not visualized.  No adnexal mass.   Other findings:  No abnormal free fluid    IMPRESSION: 1. Thickened endometrial complex measuring up to 16.3 mm. In the setting of post-menopausal bleeding, endometrial sampling is indicated to exclude carcinoma. If results are benign, sonohysterogram should be considered for focal lesion work-up. (Ref: Radiological Reasoning: Algorithmic Workup of Abnormal Vaginal Bleeding with Endovaginal Sonography and Sonohysterography. AJR 2008; 381:O17-51). 2. Large 8.5 cm intramural fibroid at the mid uterine body. 3. Nonvisualization of either ovary.  No adnexal mass or free fluid.    12/04/2020 FINAL MICROSCOPIC DIAGNOSIS:   A. ENDOMETRIUM, BIOPSY:  - Benign endometrial type polyp.   Assessment & Plan:  PMPB  Prev scheduled for hysteroscopy with resection of polyp. Cancelled. She wants to rescheduled but, after Dec and the holidays.   Patient desires surgical management with hysteroscopy with polypectomy.  The risks of surgery were discussed in detail with the patient including but not limited to: bleeding which may require transfusion or reoperation; infection which may require prolonged hospitalization or re-hospitalization and antibiotic therapy; injury to bowel, bladder, ureters and major vessels or other surrounding organs; need for additional procedures including laparotomy; thromboembolic phenomenon, incisional problems and other postoperative or anesthesia complications.  Patient was told that the likelihood that her condition and symptoms will be treated effectively with this surgical management was very high; the postoperative expectations were also discussed in detail. The patient also understands the alternative treatment options which were discussed in full. All questions were answered.  She was told that she will be contacted by our surgical scheduler regarding the time and date of her surgery; routine preoperative instructions of having nothing to eat or drink after  midnight on the day prior to surgery and also coming to the  hospital 1 1/2 hours prior to her time of surgery were also emphasized.  She was told she may be called for a preoperative appointment about a week prior to surgery and will be given further preoperative instructions at that visit. Printed patient education handouts about the procedure were given to the patient to review at home.   Total face-to-face time with patient, review of chart, discussion with consultant and coordination of care was 44min.    Jadiel Schmieder L. Harraway-Smith, M.D., Cherlynn June

## 2021-03-08 ENCOUNTER — Other Ambulatory Visit: Payer: Self-pay | Admitting: Neurology

## 2021-03-08 DIAGNOSIS — M545 Low back pain, unspecified: Secondary | ICD-10-CM

## 2021-03-08 MED ORDER — OXYCODONE-ACETAMINOPHEN 10-325 MG PO TABS: 1.0000 | ORAL_TABLET | Freq: Four times a day (QID) | ORAL | 0 refills | Status: DC | PRN

## 2021-03-08 NOTE — Telephone Encounter (Signed)
Pt is requesting a refill for oxyCODONE-acetaminophen (PERCOCET) 10-325 MG tablet.  Pharmacy: oxyCODONE-acetaminophen (PERCOCET) 10-325 MG tablet

## 2021-03-08 NOTE — Telephone Encounter (Signed)
This medication was discontinue, but I did not see a reason why.  Did you mean to discontinue this medication?

## 2021-03-08 NOTE — Telephone Encounter (Signed)
Received refill request for Oxycodone.  Last OV was on 01/24/21.  Next OV is scheduled for 05/03/21 .  Last RX was written on 02/09/21 for 120 tabs.   [place] Drug Database has been reviewed.

## 2021-03-14 ENCOUNTER — Encounter: Payer: Self-pay | Admitting: Obstetrics & Gynecology

## 2021-03-14 DIAGNOSIS — H81399 Other peripheral vertigo, unspecified ear: Secondary | ICD-10-CM | POA: Diagnosis not present

## 2021-03-26 ENCOUNTER — Other Ambulatory Visit: Payer: Self-pay | Admitting: Family Medicine

## 2021-03-26 DIAGNOSIS — N390 Urinary tract infection, site not specified: Secondary | ICD-10-CM | POA: Diagnosis not present

## 2021-03-26 DIAGNOSIS — Z7251 High risk heterosexual behavior: Secondary | ICD-10-CM | POA: Diagnosis not present

## 2021-03-26 DIAGNOSIS — Z6837 Body mass index (BMI) 37.0-37.9, adult: Secondary | ICD-10-CM | POA: Diagnosis not present

## 2021-03-27 DIAGNOSIS — Z7251 High risk heterosexual behavior: Secondary | ICD-10-CM | POA: Diagnosis not present

## 2021-03-27 DIAGNOSIS — H81399 Other peripheral vertigo, unspecified ear: Secondary | ICD-10-CM | POA: Diagnosis not present

## 2021-04-02 ENCOUNTER — Telehealth: Payer: Self-pay | Admitting: General Practice

## 2021-04-02 NOTE — Telephone Encounter (Signed)
Late Entry:Called patient on Thursday, 03/29/2021 to inform patient that she would need surgery clearance from her ENT before her surgery on 06/05/2021 with Dr. Ihor Dow.  Patient wasn't feeling well and asked me to call her back the next day to remind her to call ENT office.  Called patient on Friday, 03/30/2021 to remind her to call ENT;no answer.  Will call patient at a later time.

## 2021-04-02 NOTE — Telephone Encounter (Signed)
-----   Message from Francia Greaves sent at 03/28/2021  2:10 PM EST ----- Regarding: Upcoming Surgery Can you contact/work with this patient regarding surgery clearance for her surgery on 2/7. I found an old surgery request that Gay Filler printed off from [facility] for this patient back in August, where Dr. Rip Harbour wanted this patient to have clearance from ENT (ear nose and throat) because she just has surgery with them and she may need to be intubated with this upcoming surgery.

## 2021-04-03 DIAGNOSIS — M549 Dorsalgia, unspecified: Secondary | ICD-10-CM | POA: Diagnosis not present

## 2021-04-03 DIAGNOSIS — E119 Type 2 diabetes mellitus without complications: Secondary | ICD-10-CM | POA: Diagnosis not present

## 2021-04-03 DIAGNOSIS — Z6837 Body mass index (BMI) 37.0-37.9, adult: Secondary | ICD-10-CM | POA: Diagnosis not present

## 2021-04-03 DIAGNOSIS — B3781 Candidal esophagitis: Secondary | ICD-10-CM | POA: Diagnosis not present

## 2021-04-03 DIAGNOSIS — N390 Urinary tract infection, site not specified: Secondary | ICD-10-CM | POA: Diagnosis not present

## 2021-04-10 ENCOUNTER — Other Ambulatory Visit: Payer: Self-pay | Admitting: Neurology

## 2021-04-10 DIAGNOSIS — M545 Low back pain, unspecified: Secondary | ICD-10-CM

## 2021-04-10 MED ORDER — OXYCODONE-ACETAMINOPHEN 10-325 MG PO TABS: 1.0000 | ORAL_TABLET | Freq: Four times a day (QID) | ORAL | 0 refills | Status: DC | PRN

## 2021-04-10 NOTE — Telephone Encounter (Signed)
Pt's son, Vevelyn Pat (on [place]) request refill for oxyCODONE-acetaminophen (PERCOCET) 10-325 MG tablet at [place]

## 2021-04-19 DIAGNOSIS — H52209 Unspecified astigmatism, unspecified eye: Secondary | ICD-10-CM | POA: Diagnosis not present

## 2021-04-19 DIAGNOSIS — H5203 Hypermetropia, bilateral: Secondary | ICD-10-CM | POA: Diagnosis not present

## 2021-05-03 ENCOUNTER — Encounter: Payer: Self-pay | Admitting: Neurology

## 2021-05-03 ENCOUNTER — Ambulatory Visit (INDEPENDENT_AMBULATORY_CARE_PROVIDER_SITE_OTHER): Payer: Medicare HMO | Admitting: Neurology

## 2021-05-03 ENCOUNTER — Other Ambulatory Visit: Payer: Self-pay

## 2021-05-03 VITALS — BP 158/86 | HR 83 | Ht 65.0 in | Wt 226.0 lb

## 2021-05-03 DIAGNOSIS — M542 Cervicalgia: Secondary | ICD-10-CM

## 2021-05-03 DIAGNOSIS — M7552 Bursitis of left shoulder: Secondary | ICD-10-CM | POA: Diagnosis not present

## 2021-05-03 DIAGNOSIS — M7062 Trochanteric bursitis, left hip: Secondary | ICD-10-CM | POA: Diagnosis not present

## 2021-05-03 DIAGNOSIS — M7551 Bursitis of right shoulder: Secondary | ICD-10-CM | POA: Diagnosis not present

## 2021-05-03 DIAGNOSIS — M5432 Sciatica, left side: Secondary | ICD-10-CM

## 2021-05-03 DIAGNOSIS — M5431 Sciatica, right side: Secondary | ICD-10-CM | POA: Diagnosis not present

## 2021-05-03 DIAGNOSIS — M545 Low back pain, unspecified: Secondary | ICD-10-CM | POA: Diagnosis not present

## 2021-05-03 DIAGNOSIS — H52209 Unspecified astigmatism, unspecified eye: Secondary | ICD-10-CM | POA: Diagnosis not present

## 2021-05-03 DIAGNOSIS — H5203 Hypermetropia, bilateral: Secondary | ICD-10-CM | POA: Diagnosis not present

## 2021-05-03 DIAGNOSIS — M7061 Trochanteric bursitis, right hip: Secondary | ICD-10-CM

## 2021-05-03 MED ORDER — OXYCODONE-ACETAMINOPHEN 10-325 MG PO TABS: 1.0000 | ORAL_TABLET | Freq: Four times a day (QID) | ORAL | 0 refills | Status: DC | PRN

## 2021-05-03 MED ORDER — FLUCONAZOLE 150 MG PO TABS: 150.0000 mg | ORAL_TABLET | Freq: Every day | ORAL | 0 refills | Status: DC

## 2021-05-03 MED ORDER — HYDROXYZINE PAMOATE 25 MG PO CAPS: 25.0000 mg | ORAL_CAPSULE | Freq: Three times a day (TID) | ORAL | 2 refills | Status: DC | PRN

## 2021-05-03 NOTE — Progress Notes (Signed)
t  [facility]  PATIENT: Kimberly Mann DOB: 05-13-1955  REFERRING CLINICIAN: [name]  HISTORY FROM: Patient  REASON FOR VISIT: Left shoulder and neck pain; back pain   HISTORICAL  CHIEF COMPLAINT:  Chief Complaint  Patient presents with   Follow-up    Pt alone, rm 1. Here for follow up. Needing refills on medication.    HISTORY OF PRESENT ILLNESS:  Kimberly Mann is a 66 y.o. woman with neck, back, hip and shoulder pain.  Update 05/03/2021 She reports back pain and hip pain.   Also has some right > left shoulder.  The worst pain is in the hips, a little more on the right than the left.  P pain worsens with standing or walking.   We have done trochanteric bursae injections several times at with benefit.  Other times piriformis muscle injections have been helpful.  She also reports bilateral shoulder pain with some neck pain.  In the past, shoulder pain has improved after subacromial bursa injections.  She had a bone density test showing osteopenia.   She does get steroid injection from [place] 3 times a year (usually 80 mg Depo-Medrol) and also has had some steroid packs    the shots help her more than the steroid packs.  We discussed that we need to continue to try to limit the injections to just a couple each year.  She has migraine headaches twice a week..  Imitrex has not worked in the past.  Fioricet has helped her some in the past but she has not taken for 3-4/year.  She has some depression but is doing better than earlier in the year.  Her mother died in 25-Jul-2018.  She has breast cancer,  She is done with [facility]Rx and still is on tamoxifen.        MRI Imaging: MRI cervical spine 03/06/2017:  Shallow broad-based central protrusion at C4-5 slightly deforms the  cord but the central canal and foramina are open.    Mild right foraminal narrowing at C5-6 due to uncovertebral  spurring. The central canal and left foramen are open at this level.    Based on comparison  with the report of the prior MRI, the appearance of the cervical spine is unchanged.   MRI brain 10/06/2019 [facility]):  This MRI of the brain with and without contrast with added attention to the internal auditory canals shows the following:   The 8th nerves and the internal auditory canals appear normal. Scattered T2/FLAIR hyperintense foci in the subcortical and deep white matter consistent with chronic microvascular ischemic change.  None of the foci appear to be acute.  There is a normal enhancement pattern and no acute findings.     REVIEW OF SYSTEMS:  Constitutional: No fevers, chills, sweats, or change in appetite Eyes: No visual changes, double vision, eye pain Ear, nose and throat: No hearing loss, ear pain, nasal congestion, sore throat Cardiovascular: No chest pain, palpitations Respiratory:  No shortness of breath at rest or with exertion.   No wheezes GastrointestinaI: No nausea, vomiting, diarrhea, abdominal pain, fecal incontinence Genitourinary:  No dysuria, urinary retention or frequency.  No nocturia. Musculoskeletal: as above Integumentary: No rash, pruritus, skin lesions Neurological: as above Psychiatric: She has som depression since breast cancer dx.  Endocrine: No palpitations, diaphoresis, change in appetite, change in weigh or increased thirst  ALLERGIES: Allergies  Allergen Reactions   Morphine And Related Nausea And Vomiting and Swelling   Penicillins    Sulfa Antibiotics  HOME MEDICATIONS: Outpatient Medications Prior to Visit  Medication Sig Dispense Refill   amlodipine-atorvastatin (CADUET) 2.5-40 MG tablet Take 1 tablet by mouth daily.     butalbital-acetaminophen-caffeine (FIORICET) 50-325-40 MG tablet Take one po qd prn headache 15 tablet 3   diclofenac sodium (VOLTAREN) 1 % GEL Apply 2 g topically 4 (four) times daily as needed. 200 g 5   fluticasone (FLONASE) 50 MCG/ACT nasal spray Use 2 spray(s) in each nostril once daily 16 g 3    levocetirizine (XYZAL) 5 MG tablet Take 1 tablet (5 mg total) by mouth every evening. 90 tablet 2   lidocaine (XYLOCAINE) 5 % ointment Apply 1 application topically as needed. 35.44 g 5   montelukast (SINGULAIR) 10 MG tablet Take 1 tablet (10 mg total) by mouth at bedtime. 30 tablet 3   Multiple Vitamins-Minerals (MULTIVITAMIN ADULT PO)      ondansetron (ZOFRAN ODT) 4 MG disintegrating tablet 4mg  ODT q4 hours prn nausea/vomit 10 tablet 0   promethazine (PHENERGAN) 25 MG tablet 25 mg as needed.     tamoxifen (NOLVADEX) 20 MG tablet Take 20 mg by mouth daily.     triamterene-hydrochlorothiazide (MAXZIDE-25) 37.5-25 MG tablet Take 1/2 (one-half) tablet by mouth once daily 90 tablet 0   Vitamin D, Ergocalciferol, (DRISDOL) 1.25 MG (50000 UNIT) CAPS capsule Take 1 capsule by mouth once a week 4 capsule 0   fluconazole (DIFLUCAN) 150 MG tablet Take 1 tablet (150 mg total) by mouth daily. 15 tablet 0   hydrOXYzine (VISTARIL) 25 MG capsule Take 1 capsule (25 mg total) by mouth 3 (three) times daily as needed. 60 capsule 2   oxyCODONE-acetaminophen (PERCOCET) 10-325 MG tablet Take 1 tablet by mouth every 6 (six) hours as needed for pain. 120 tablet 0   No facility-administered medications prior to visit.    PAST MEDICAL HISTORY: Past Medical History:  Diagnosis Date   Anxiety    Cancer ([place])    breast   Chronic back pain    takes oxycodone   Complication of anesthesia    Eczema    Environmental allergies    Headache    Hypertension    PONV (postoperative nausea and vomiting)    Vertigo     PAST SURGICAL HISTORY: Past Surgical History:  Procedure Laterality Date   BREAST REDUCTION SURGERY Left 11/20/2017   Procedure: left breast reduction for symmetry and liposuction bilateral lateral breasts;  Surgeon: Wallace Going, DO;  Location: [facility];  Service: Plastics;  Laterality: Left;   BREAST SURGERY     rt breast lumpectomy   LIPOSUCTION     TUBAL LIGATION       FAMILY HISTORY: Family History  Problem Relation Age of Onset   Hypertension Mother     SOCIAL HISTORY:  Social History   Socioeconomic History   Marital status: Legally Separated    Spouse name: Not on file   Number of children: Not on file   Years of education: Not on file   Highest education level: Not on file  Occupational History   Not on file  Tobacco Use   Smoking status: Former    Types: Cigarettes    Quit date: 05/17/1974    Years since quitting: 46.9   Smokeless tobacco: Never  Vaping Use   Vaping Use: Never used  Substance and Sexual Activity   Alcohol use: Not Currently    Alcohol/week: 7.0 standard drinks    Types: 7 Standard drinks or equivalent per week  Drug use: No    Types: Marijuana   Sexual activity: Not on file  Other Topics Concern   Not on file  Social History Narrative   Not on file   Social Determinants of Health   Financial Resource Strain: Low Risk    Difficulty of Paying Living Expenses: Not hard at all  Food Insecurity: No Food Insecurity   Worried About Running Out of Food in the Last Year: Never true   [place] in the Last Year: Never true  Transportation Needs: No Transportation Needs   Lack of Transportation (Medical): No   Lack of Transportation (Non-Medical): No  Physical Activity: Inactive   Days of Exercise per Week: 0 days   Minutes of Exercise per Session: 0 min  Stress: No Stress Concern Present   Feeling of Stress : Not at all  Social Connections: Moderately Isolated   Frequency of Communication with Friends and Family: More than three times a week   Frequency of Social Gatherings with Friends and Family: More than three times a week   Attends Religious Services: More than 4 times per year   Active Member of Clubs or Organizations: No   Attends Archivist Meetings: Never   Marital Status: Separated  Intimate Partner Violence: Not At Risk   Fear of Current or Ex-Partner: No   Emotionally  Abused: No   Physically Abused: No   Sexually Abused: No     PHYSICAL EXAM  Vitals:   05/03/21 1118  BP: (!) 158/86  Pulse: 83  Weight: 226 lb (102.5 kg)  Height: 5\' 5"  (1.651 m)    Body mass index is 37.61 kg/m.   General: The patient is well-developed and well-nourished and in no acute distress   Musculoskeletal:   She is tender over bilateral subacromial bursae, right much more than left.  She also has tenderness over the lower lumbar paraspinal muscles more than the piriformis muscles and R > L and bilateral trochanteric bursae.  Neurologic Exam  Mental status: The patient is alert and oriented x 3 at the time of the examination. The patient has apparent normal recent and remote memory, with an apparently normal attention span and concentration ability.    Cranial nerves:    Facial strength is normal.  Trapezius strength is normal.  No dysarthria is noted.    No obvious hearing deficits are noted.  Motor:  Muscle bulk and tone are normal. Strength is 5/5 in the arms or legs.   Gait and station: Station is normal.  She has an arthritic gait.  Her tandem gait is wide.  Romberg is negative.  DTRs:   Reflexes are normal and symmetric in the arms and legs.     ASSESSMENT AND PLAN   1. Trochanteric bursitis, left hip   2. Back pain, lumbosacral   3. Bilateral sciatica   4. Trochanteric bursitis, right hip   5. Subacromial bursitis of both shoulders   6. Neck pain       1.   Injection of bilateral trochanteric bursae with 30 mg Depo-Medrol in 5 cc lidocaine using sterile technique..  She tolerated the injections well and pain was better afterwards. 2.   Injection of right  subacromial bursae with 20 mg Depo-Medrol in 4cc lidocaine using sterile technique..  She tolerated the injections well and pain was better afterwards. 3.   Trigger point injection of bilateral L4 and L5 muscles with 30 mg Depo-Medrol in 4 cc lidocaine continue  4.  Renew Percocet.  The [place] controlled substance database was reviewed.  She has been compliant.  A postdated prescription has been sent to the pharmacy.  She has had yeast infections after some of the steroid injections and I will send in a prescription for Diflucan. 5.   She has osteopenia.  We have been averaging 1 steroid injection every 4 months and she reports she may get steroid 1 or 2 other times a year.  I have asked her to discuss her osteopenia with her primary care practice as she may benefit from treatment with a bisphosphonate or other medication. 4.   She will return to see me in 4 months, sooner if she  has new or worsening neurologic symptoms.    Hajira Verhagen A. Felecia Shelling, MD, PhD 6/0/6004, 59:97 AM Certified in Neurology, Clinical Neurophysiology, Sleep Medicine, Pain Medicine and Neuroimaging  [facility] [address], [place], [place] [address] [phone]

## 2021-05-15 ENCOUNTER — Other Ambulatory Visit: Payer: Self-pay | Admitting: Family Medicine

## 2021-05-29 ENCOUNTER — Encounter (HOSPITAL_BASED_OUTPATIENT_CLINIC_OR_DEPARTMENT_OTHER): Payer: Self-pay | Admitting: Obstetrics & Gynecology

## 2021-05-29 ENCOUNTER — Other Ambulatory Visit: Payer: Self-pay

## 2021-05-29 NOTE — Progress Notes (Signed)
Spoke w/ via phone for pre-op interview--- Kimberly Mann needs dos----   Istat            Mann results------Current EKG dated 11/2020 in Epic. COVID test -----patient states asymptomatic no test needed Arrive at -------1000 NPO after MN NO Solid Food.   Med rec completed Medications to take morning of surgery ----- Caduet Diabetic medication ----- Patient instructed no nail polish to be worn day of surgery Patient instructed to bring photo id and insurance card day of surgery Patient aware to have Driver (ride ) / caregiver  Son Kimberly Mann   for 24 hours after surgery  Patient Special Instructions ----- Pre-Op special Istructions ----- Patient verbalized understanding of instructions that were given at this phone interview. Patient denies shortness of breath, chest pain, fever, cough at this phone interview.

## 2021-06-05 ENCOUNTER — Other Ambulatory Visit: Payer: Self-pay

## 2021-06-05 ENCOUNTER — Ambulatory Visit (HOSPITAL_BASED_OUTPATIENT_CLINIC_OR_DEPARTMENT_OTHER): Payer: Medicare HMO | Admitting: Anesthesiology

## 2021-06-05 ENCOUNTER — Ambulatory Visit (HOSPITAL_BASED_OUTPATIENT_CLINIC_OR_DEPARTMENT_OTHER)
Admission: RE | Admit: 2021-06-05 | Discharge: 2021-06-05 | Disposition: A | Discharge status: Home / Self Care | Payer: Medicare HMO | Attending: Obstetrics & Gynecology | Admitting: Obstetrics & Gynecology

## 2021-06-05 ENCOUNTER — Encounter (HOSPITAL_BASED_OUTPATIENT_CLINIC_OR_DEPARTMENT_OTHER): Payer: Self-pay | Admitting: Obstetrics & Gynecology

## 2021-06-05 ENCOUNTER — Telehealth: Payer: Self-pay | Admitting: General Practice

## 2021-06-05 ENCOUNTER — Encounter (HOSPITAL_BASED_OUTPATIENT_CLINIC_OR_DEPARTMENT_OTHER)
Admission: RE | Disposition: A | Discharge status: Home / Self Care | Payer: Self-pay | Source: Home / Self Care | Attending: Obstetrics & Gynecology

## 2021-06-05 DIAGNOSIS — R9389 Abnormal findings on diagnostic imaging of other specified body structures: Secondary | ICD-10-CM | POA: Diagnosis not present

## 2021-06-05 DIAGNOSIS — F419 Anxiety disorder, unspecified: Secondary | ICD-10-CM | POA: Insufficient documentation

## 2021-06-05 DIAGNOSIS — Z79899 Other long term (current) drug therapy: Secondary | ICD-10-CM | POA: Insufficient documentation

## 2021-06-05 DIAGNOSIS — Z853 Personal history of malignant neoplasm of breast: Secondary | ICD-10-CM | POA: Diagnosis not present

## 2021-06-05 DIAGNOSIS — N924 Excessive bleeding in the premenopausal period: Secondary | ICD-10-CM | POA: Diagnosis not present

## 2021-06-05 DIAGNOSIS — D259 Leiomyoma of uterus, unspecified: Secondary | ICD-10-CM | POA: Diagnosis not present

## 2021-06-05 DIAGNOSIS — Z87891 Personal history of nicotine dependence: Secondary | ICD-10-CM | POA: Diagnosis not present

## 2021-06-05 DIAGNOSIS — I10 Essential (primary) hypertension: Secondary | ICD-10-CM | POA: Insufficient documentation

## 2021-06-05 DIAGNOSIS — N950 Postmenopausal bleeding: Secondary | ICD-10-CM | POA: Diagnosis not present

## 2021-06-05 DIAGNOSIS — N840 Polyp of corpus uteri: Secondary | ICD-10-CM | POA: Insufficient documentation

## 2021-06-05 HISTORY — PX: HYSTEROSCOPY WITH D & C: SHX1775

## 2021-06-05 LAB — POCT I-STAT, CHEM 8
BUN: 15 mg/dL (ref 8–23)
Calcium, Ion: 1.2 mmol/L (ref 1.15–1.40)
Chloride: 106 mmol/L (ref 98–111)
Creatinine, Ser: 0.8 mg/dL (ref 0.44–1.00)
Glucose, Bld: 94 mg/dL (ref 70–99)
HCT: 46 % (ref 36.0–46.0)
Hemoglobin: 15.6 g/dL — ABNORMAL HIGH (ref 12.0–15.0)
Potassium: 3.6 mmol/L (ref 3.5–5.1)
Sodium: 144 mmol/L (ref 135–145)
TCO2: 26 mmol/L (ref 22–32)

## 2021-06-05 SURGERY — DILATATION AND CURETTAGE /HYSTEROSCOPY
Anesthesia: General | Site: Uterus

## 2021-06-05 MED ORDER — FENTANYL CITRATE (PF) 100 MCG/2ML IJ SOLN
INTRAMUSCULAR | Status: DC | PRN
  Administered 2021-06-05: 50 ug via INTRAVENOUS
  Administered 2021-06-05: 100 ug via INTRAVENOUS
  Administered 2021-06-05: 50 ug via INTRAVENOUS

## 2021-06-05 MED ORDER — ACETAMINOPHEN 160 MG/5ML PO SOLN: 325.0000 mg | ORAL | Status: DC | PRN

## 2021-06-05 MED ORDER — LACTATED RINGERS IV SOLN: INTRAVENOUS | Status: DC

## 2021-06-05 MED ORDER — MEPERIDINE HCL 25 MG/ML IJ SOLN: 6.2500 mg | INTRAMUSCULAR | Status: DC | PRN

## 2021-06-05 MED ORDER — ACETAMINOPHEN 325 MG PO TABS: 325.0000 mg | ORAL_TABLET | ORAL | Status: DC | PRN

## 2021-06-05 MED ORDER — AMISULPRIDE (ANTIEMETIC) 5 MG/2ML IV SOLN
INTRAVENOUS | Status: AC
  Filled 2021-06-05: qty 4

## 2021-06-05 MED ORDER — DEXAMETHASONE SODIUM PHOSPHATE 10 MG/ML IJ SOLN
INTRAMUSCULAR | Status: DC | PRN
  Administered 2021-06-05: 10 mg via INTRAVENOUS

## 2021-06-05 MED ORDER — LIDOCAINE HCL (PF) 2 % IJ SOLN
INTRAMUSCULAR | Status: AC
  Filled 2021-06-05: qty 5

## 2021-06-05 MED ORDER — BUPIVACAINE HCL (PF) 0.5 % IJ SOLN
INTRAMUSCULAR | Status: DC | PRN
  Administered 2021-06-05: 20 mL

## 2021-06-05 MED ORDER — FENTANYL CITRATE (PF) 100 MCG/2ML IJ SOLN
INTRAMUSCULAR | Status: AC
  Filled 2021-06-05: qty 2

## 2021-06-05 MED ORDER — IBUPROFEN 200 MG PO TABS
ORAL_TABLET | ORAL | Status: AC
  Filled 2021-06-05: qty 3

## 2021-06-05 MED ORDER — ONDANSETRON HCL 4 MG/2ML IJ SOLN: 4.0000 mg | Freq: Once | INTRAMUSCULAR | Status: DC | PRN

## 2021-06-05 MED ORDER — MIDAZOLAM HCL 2 MG/2ML IJ SOLN
INTRAMUSCULAR | Status: AC
  Filled 2021-06-05: qty 2

## 2021-06-05 MED ORDER — AMISULPRIDE (ANTIEMETIC) 5 MG/2ML IV SOLN
10.0000 mg | Freq: Once | INTRAVENOUS | Status: AC
  Administered 2021-06-05: 10 mg via INTRAVENOUS

## 2021-06-05 MED ORDER — SCOPOLAMINE 1 MG/3DAYS TD PT72
MEDICATED_PATCH | TRANSDERMAL | Status: AC
  Filled 2021-06-05: qty 1

## 2021-06-05 MED ORDER — LACTATED RINGERS IV SOLN: INTRAVENOUS | Status: DC | PRN

## 2021-06-05 MED ORDER — HYDRALAZINE HCL 20 MG/ML IJ SOLN
INTRAMUSCULAR | Status: DC | PRN
  Administered 2021-06-05 (×2): 5 mg via INTRAVENOUS

## 2021-06-05 MED ORDER — SODIUM CHLORIDE 0.9 % IR SOLN
Status: DC | PRN
  Administered 2021-06-05: 1100 mL

## 2021-06-05 MED ORDER — PROPOFOL 500 MG/50ML IV EMUL
INTRAVENOUS | Status: DC | PRN
  Administered 2021-06-05: 200 ug/kg/min via INTRAVENOUS

## 2021-06-05 MED ORDER — LACTATED RINGERS IV SOLN
INTRAVENOUS | Status: DC
Start: 2021-06-05 — End: 2021-06-05

## 2021-06-05 MED ORDER — ESMOLOL HCL 100 MG/10ML IV SOLN
INTRAVENOUS | Status: DC | PRN
  Administered 2021-06-05 (×2): 10 mg via INTRAVENOUS

## 2021-06-05 MED ORDER — LIDOCAINE HCL (CARDIAC) PF 100 MG/5ML IV SOSY
PREFILLED_SYRINGE | INTRAVENOUS | Status: DC | PRN
  Administered 2021-06-05: 80 mg via INTRAVENOUS

## 2021-06-05 MED ORDER — IBUPROFEN 200 MG PO TABS
600.0000 mg | ORAL_TABLET | Freq: Once | ORAL | Status: AC
  Administered 2021-06-05: 600 mg via ORAL

## 2021-06-05 MED ORDER — HYDRALAZINE HCL 20 MG/ML IJ SOLN
INTRAMUSCULAR | Status: AC
  Filled 2021-06-05: qty 1

## 2021-06-05 MED ORDER — SCOPOLAMINE 1 MG/3DAYS TD PT72
1.0000 | MEDICATED_PATCH | TRANSDERMAL | Status: DC
  Administered 2021-06-05: 1.5 mg via TRANSDERMAL

## 2021-06-05 MED ORDER — ACETAMINOPHEN 500 MG PO TABS
ORAL_TABLET | ORAL | Status: AC
  Filled 2021-06-05: qty 2

## 2021-06-05 MED ORDER — OXYCODONE HCL 5 MG PO TABS
5.0000 mg | ORAL_TABLET | Freq: Once | ORAL | Status: DC | PRN
Start: 2021-06-05 — End: 2021-06-05

## 2021-06-05 MED ORDER — PROPOFOL 10 MG/ML IV BOLUS
INTRAVENOUS | Status: AC
  Filled 2021-06-05: qty 20

## 2021-06-05 MED ORDER — FENTANYL CITRATE (PF) 100 MCG/2ML IJ SOLN: 25.0000 ug | INTRAMUSCULAR | Status: DC | PRN

## 2021-06-05 MED ORDER — ONDANSETRON HCL 4 MG/2ML IJ SOLN
INTRAMUSCULAR | Status: DC | PRN
Start: 2021-06-05 — End: 2021-06-05
  Administered 2021-06-05: 4 mg via INTRAVENOUS

## 2021-06-05 MED ORDER — DEXAMETHASONE SODIUM PHOSPHATE 10 MG/ML IJ SOLN
INTRAMUSCULAR | Status: AC
  Filled 2021-06-05: qty 1

## 2021-06-05 MED ORDER — SOD CITRATE-CITRIC ACID 500-334 MG/5ML PO SOLN: 30.0000 mL | ORAL | Status: DC

## 2021-06-05 MED ORDER — MIDAZOLAM HCL 2 MG/2ML IJ SOLN
INTRAMUSCULAR | Status: DC | PRN
  Administered 2021-06-05: 2 mg via INTRAVENOUS

## 2021-06-05 MED ORDER — ACETAMINOPHEN 500 MG PO TABS
1000.0000 mg | ORAL_TABLET | ORAL | Status: AC
  Administered 2021-06-05: 1000 mg via ORAL

## 2021-06-05 MED ORDER — OXYCODONE HCL 5 MG/5ML PO SOLN: 5.0000 mg | Freq: Once | ORAL | Status: DC | PRN

## 2021-06-05 MED ORDER — ONDANSETRON 4 MG PO TBDP: 4.0000 mg | ORAL_TABLET | Freq: Four times a day (QID) | ORAL | 0 refills | Status: DC | PRN

## 2021-06-05 MED ORDER — ONDANSETRON HCL 4 MG/2ML IJ SOLN
INTRAMUSCULAR | Status: AC
  Filled 2021-06-05: qty 2

## 2021-06-05 MED ORDER — PROPOFOL 10 MG/ML IV BOLUS
INTRAVENOUS | Status: DC | PRN
Start: 2021-06-05 — End: 2021-06-05
  Administered 2021-06-05: 200 mg via INTRAVENOUS

## 2021-06-05 SURGICAL SUPPLY — 17 items
CATH ROBINSON RED A/P 16FR (CATHETERS) ×2 IMPLANT
DEVICE MYOSURE LITE (MISCELLANEOUS) IMPLANT
DEVICE MYOSURE REACH (MISCELLANEOUS) IMPLANT
DRSG TELFA 3X8 NADH (GAUZE/BANDAGES/DRESSINGS) ×2 IMPLANT
GAUZE 4X4 16PLY ~~LOC~~+RFID DBL (SPONGE) ×2 IMPLANT
GLOVE SURG ENC MOIS LTX SZ7 (GLOVE) ×2 IMPLANT
GLOVE SURG UNDER POLY LF SZ7 (GLOVE) ×2 IMPLANT
GOWN STRL REUS W/TWL LRG LVL3 (GOWN DISPOSABLE) ×2 IMPLANT
KIT PROCEDURE FLUENT (KITS) ×2 IMPLANT
KIT TURNOVER CYSTO (KITS) ×2 IMPLANT
MYOSURE XL FIBROID (MISCELLANEOUS) ×2
PACK VAGINAL MINOR WOMEN LF (CUSTOM PROCEDURE TRAY) ×2 IMPLANT
PAD DRESSING TELFA 3X8 NADH (GAUZE/BANDAGES/DRESSINGS) ×1 IMPLANT
PAD OB MATERNITY 4.3X12.25 (PERSONAL CARE ITEMS) ×2 IMPLANT
SEAL CERVICAL OMNI LOK (ABLATOR) IMPLANT
SEAL ROD LENS SCOPE MYOSURE (ABLATOR) ×2 IMPLANT
SYSTEM TISS REMOVAL MYOSURE XL (MISCELLANEOUS) IMPLANT

## 2021-06-05 NOTE — Anesthesia Procedure Notes (Signed)
Procedure Name: LMA Insertion Date/Time: 06/05/2021 12:18 PM Performed by: Verita Lamb, CRNA Pre-anesthesia Checklist: Patient identified, Emergency Drugs available, Suction available and Patient being monitored Patient Re-evaluated:Patient Re-evaluated prior to induction Oxygen Delivery Method: Circle system utilized Preoxygenation: Pre-oxygenation with 100% oxygen Induction Type: IV induction Ventilation: Mask ventilation without difficulty LMA: LMA inserted LMA Size: 4.0 Number of attempts: 1 Airway Equipment and Method: Bite block Placement Confirmation: positive ETCO2, CO2 detector and breath sounds checked- equal and bilateral Tube secured with: Tape Dental Injury: Teeth and Oropharynx as per pre-operative assessment

## 2021-06-05 NOTE — H&P (Signed)
Preoperative History and Physical  Kimberly Mann is a 66 y.o. G1P1000 here for surgical management of post menopausal bleeding and thickened endometrium. Pt is s/p tamoxifen.    Proposed surgery: hysteroscopy with dilatation and curettage and possible polypectomy   Past Medical History:  Diagnosis Date   Anxiety    Cancer ([place])    breast   Chronic back pain    takes oxycodone   Complication of anesthesia    Eczema    Environmental allergies    Headache    Hypertension    PONV (postoperative nausea and vomiting)    Vertigo    Past Surgical History:  Procedure Laterality Date   BREAST REDUCTION SURGERY Left 11/20/2017   Procedure: left breast reduction for symmetry and liposuction bilateral lateral breasts;  Surgeon: Wallace Going, DO;  Location: [facility];  Service: Plastics;  Laterality: Left;   BREAST SURGERY     rt breast lumpectomy   LIPOSUCTION     TUBAL LIGATION     OB History     Gravida  1   Para  1   Term  1   Preterm      AB      Living         SAB      IAB      Ectopic      Multiple      Live Births  1          Patient denies any cervical dysplasia or STIs. Medications Prior to Admission  Medication Sig Dispense Refill Last Dose   amlodipine-atorvastatin (CADUET) 2.5-40 MG tablet Take 1 tablet by mouth daily.      butalbital-acetaminophen-caffeine (FIORICET) 50-325-40 MG tablet Take one po qd prn headache 15 tablet 3    diclofenac sodium (VOLTAREN) 1 % GEL Apply 2 g topically 4 (four) times daily as needed. 200 g 5    fluconazole (DIFLUCAN) 150 MG tablet Take 1 tablet (150 mg total) by mouth daily. 15 tablet 0    fluticasone (FLONASE) 50 MCG/ACT nasal spray Use 2 spray(s) in each nostril once daily 16 g 3    hydrOXYzine (VISTARIL) 25 MG capsule Take 1 capsule (25 mg total) by mouth 3 (three) times daily as needed. 60 capsule 2    levocetirizine (XYZAL) 5 MG tablet Take 1 tablet (5 mg total) by mouth every  evening. 90 tablet 2    lidocaine (XYLOCAINE) 5 % ointment Apply 1 application topically as needed. 35.44 g 5    montelukast (SINGULAIR) 10 MG tablet Take 1 tablet (10 mg total) by mouth at bedtime. 30 tablet 3    Multiple Vitamins-Minerals (MULTIVITAMIN ADULT PO)       ondansetron (ZOFRAN ODT) 4 MG disintegrating tablet 4mg  ODT q4 hours prn nausea/vomit 10 tablet 0    oxyCODONE-acetaminophen (PERCOCET) 10-325 MG tablet Take 1 tablet by mouth every 6 (six) hours as needed for pain. 120 tablet 0    promethazine (PHENERGAN) 25 MG tablet 25 mg as needed.      tamoxifen (NOLVADEX) 20 MG tablet Take 20 mg by mouth daily.      triamterene-hydrochlorothiazide (MAXZIDE-25) 37.5-25 MG tablet Take 1/2 (one-half) tablet by mouth once daily 90 tablet 0    Vitamin D, Ergocalciferol, (DRISDOL) 1.25 MG (50000 UNIT) CAPS capsule Take 1 capsule by mouth once a week 12 capsule 0     Allergies  Allergen Reactions   Morphine And Related Nausea And Vomiting and Swelling   Penicillins  Sulfa Antibiotics    Social History:   reports that she quit smoking about 47 years ago. Her smoking use included cigarettes. She has never used smokeless tobacco. She reports that she does not currently use alcohol after a past usage of about 7.0 standard drinks per week. She reports that she does not use drugs. Family History  Problem Relation Age of Onset   Hypertension Mother     Review of Systems: Noncontributory  PHYSICAL EXAM: Height 5\' 5"  (1.651 m). General appearance - alert, well appearing, and in no distress Chest - clear to auscultation, no wheezes, rales or rhonchi, symmetric air entry Heart - normal rate and regular rhythm Abdomen - soft, nontender, nondistended, no masses or organomegaly Pelvic - examination not indicated Extremities - peripheral pulses normal, no pedal edema, no clubbing or cyanosis  Labs: 12/04/2020 Clinical History: PMB, history of right breast cancer, H/O tamoxifen  therapy (cm)    FINAL MICROSCOPIC DIAGNOSIS:   A. ENDOMETRIUM, BIOPSY:  - Benign endometrial type polyp.   Imaging Studies: 12/04/2020 CLINICAL DATA:  Initial evaluation for postmenopausal bleeding, on tamoxifen.   EXAM: ULTRASOUND PELVIS TRANSVAGINAL   TECHNIQUE: Transvaginal ultrasound examination of the pelvis was performed including evaluation of the uterus, ovaries, adnexal regions, and pelvic cul-de-sac.   COMPARISON:  Prior CT from 07/21/2009.   FINDINGS: Uterus   Measurements: 7.1 x 6.9 x 9.4 cm = volume: 238.3 mL. Large intramural fibroid positioned at the mid uterine body measures 8.5 x 6.5 x 6.4 cm.   Endometrium   Thickness: 16.3 mm. Endometrial complex is mildly heterogeneous without discrete focal abnormality. No appreciable associated vascularity.   Right ovary   Not visualized.  No adnexal mass.   Left ovary   Not visualized.  No adnexal mass.   Other findings:  No abnormal free fluid   IMPRESSION: 1. Thickened endometrial complex measuring up to 16.3 mm. In the setting of post-menopausal bleeding, endometrial sampling is indicated to exclude carcinoma. If results are benign, sonohysterogram should be considered for focal lesion work-up. (Ref: Radiological Reasoning: Algorithmic Workup of Abnormal Vaginal Bleeding with Endovaginal Sonography and Sonohysterography. AJR 2008; 638:V56-43). 2. Large 8.5 cm intramural fibroid at the mid uterine body. 3. Nonvisualization of either ovary.  No adnexal mass or free fluid.  Assessment: Patient Active Problem List   Diagnosis Date Noted   Subacromial bursitis of both shoulders 09/07/2020   Migraine without aura and without status migrainosus, not intractable 12/22/2019   Non-intractable vomiting 08/25/2019   Chronic rhinitis 03/20/2018   Adverse food reaction 03/20/2018   Drug reaction 03/20/2018   Seasonal allergies 01/22/2018   Breast cancer ([place]) 12/11/2017   Vertigo 06/16/2017   Essential hypertension  06/16/2017   Trochanteric bursitis, left hip 01/20/2017   Trochanteric bursitis, right hip 09/18/2016   Ear pain, bilateral 09/18/2016   Back pain, lumbosacral 05/20/2016   Chronic pain of both shoulders 09/13/2015   Trochanteric bursitis of left hip 05/17/2015   Bilateral sciatica 05/17/2015   Subacromial bursitis 12/13/2014   Osteoarthritis of left shoulder 05/17/2014   Neck pain 05/17/2014   Chronic midline low back pain without sciatica 05/17/2014   Migraine 05/17/2014    Plan: Patient will undergo surgical management with hysteroscopy with dilatation and curettage and possible polypectomy.  The risks of surgery were discussed in detail with the patient including but not limited to: bleeding which may require transfusion or reoperation; infection which may require antibiotics; injury to surrounding organs which may involve bowel, bladder, ureters ; need for  additional procedures including laparoscopy or laparotomy; thromboembolic phenomenon, surgical site problems and other postoperative/anesthesia complications. Likelihood of success in alleviating the patient's condition was discussed. Routine postoperative instructions will be reviewed with the patient and her family in detail after surgery.  The patient concurred with the proposed plan, giving informed written consent for the surgery.  Patient has been NPO since last night she will remain NPO for procedure.  Anesthesia and OR aware.  Preoperative prophylactic antibiotics and SCDs ordered on call to the OR.  To OR when ready.  Fronia Depass L. Ihor Dow, M.D., [facility] 06/05/2021 9:57 AM

## 2021-06-05 NOTE — Brief Op Note (Signed)
06/05/2021  12:13 PM  PATIENT:  Aldona Bar  66 y.o. female  PRE-OPERATIVE DIAGNOSIS:  Polyp PMB  POST-OPERATIVE DIAGNOSIS:  Polyp PMB  PROCEDURE:  Procedure(s): DILATATION AND CURETTAGE /HYSTEROSCOPY/MYOSURE RESECTION OF UTERINE FIBROIDS (N/A)  SURGEON:  Surgeon(s) and Role:    * Lavonia Drafts, MD - Primary  ANESTHESIA:   general and paracervical block  EBL:  30 mL   BLOOD ADMINISTERED:none  DRAINS: none   LOCAL MEDICATIONS USED:  MARCAINE     SPECIMEN:  Source of Specimen:  endometrial curetting and polyp   DISPOSITION OF SPECIMEN:  PATHOLOGY  COUNTS:  YES  TOURNIQUET:  * No tourniquets in log *  DICTATION: .Note written in EPIC  PLAN OF CARE: Discharge to home after PACU  PATIENT DISPOSITION:  PACU - hemodynamically stable.   Delay start of Pharmacological VTE agent (>24hrs) due to surgical blood loss or risk of bleeding: not applicable  Complications: none immediate   Malakhai Beitler L. Harraway-Smith, M.D., Cherlynn June

## 2021-06-05 NOTE — Op Note (Signed)
06/05/2021  12:13 PM  PATIENT:  Kimberly Mann  66 y.o. female  PRE-OPERATIVE DIAGNOSIS:  Polyp PMB  POST-OPERATIVE DIAGNOSIS:  Polyp PMB  PROCEDURE:  Procedure(s): DILATATION AND CURETTAGE /HYSTEROSCOPY/MYOSURE RESECTION OF UTERINE FIBROIDS (N/A)  SURGEON:  Surgeon(s) and Role:    * Lavonia Drafts, MD - Primary  ANESTHESIA:   general and paracervical block  EBL:  30 mL   BLOOD ADMINISTERED:none  DRAINS: none   LOCAL MEDICATIONS USED:  MARCAINE     SPECIMEN:  Source of Specimen:  endometrial curetting and polyp   DISPOSITION OF SPECIMEN:  PATHOLOGY  COUNTS:  YES  TOURNIQUET:  * No tourniquets in log *  DICTATION: .Note written in EPIC  PLAN OF CARE: Discharge to home after PACU  PATIENT DISPOSITION:  PACU - hemodynamically stable.   Delay start of Pharmacological VTE agent (>24hrs) due to surgical blood loss or risk of bleeding: not applicable  Complications: none immediate   Findings: 2 large polyps (or soft fibroids) in the post and right walls of the uterus.       PROCEDURE DETAILS:  The patient was taken to the operating room where general anesthesia was administered and was found to be adequate.  After an adequate timeout was performed, she was placed in the dorsal lithotomy position and examined; then prepped and draped in the sterile manner.   Her bladder was catheterized for an unmeasured amount of clear, yellow urine. A speculum was then placed in the patient's vagina and a single tooth tenaculum was applied to the anterior lip of the cervix.   A paracervical block using 20 ml of 0.5% Marcaine was administered.  The cervix was sounded to 8 cm and dilated manually with metal dilators to accommodate the Myosure operative hysteroscope.  Once the cervix was dilated, the hysteroscope was inserted under direct visualization using LR as a suspension medium.  The uterine cavity was carefully examined, both ostia were recognized, and diffusely proliferative  endometrium with the polyps above were noted.   This was resected using the Myosure device.  After further careful visualization of the uterine cavity, the hysteroscope was removed under direct visualization.  A sharp curettage was then performed to obtain a moderate amount of endometrial curettings.  The tenaculum was removed from the anterior lip of the cervix and the vaginal speculum was removed after noting good hemostasis.  The patient tolerated the procedure well and was taken to the recovery area awake, extubated and in stable condition. 175 cc fluid deficit.   Katerin Negrete L. Harraway-Smith, M.D., [facility]

## 2021-06-05 NOTE — Transfer of Care (Signed)
Immediate Anesthesia Transfer of Care Note  Patient: Kimberly Mann  Procedure(s) Performed: DILATATION AND CURETTAGE /HYSTEROSCOPY/MYOSURE RESECTION OF UTERINE FIBROIDS (Uterus)  Patient Location: PACU  Anesthesia Type:General  Level of Consciousness: awake, alert  and drowsy  Airway & Oxygen Therapy: Patient Spontanous Breathing and Patient connected to face mask oxygen  Post-op Assessment: Report given to RN and Post -op Vital signs reviewed and stable  Post vital signs: Reviewed and stable  Last Vitals:  Vitals Value Taken Time  BP 178/104 06/05/21 1215  Temp    Pulse    Resp 17 06/05/21 1215  SpO2    Vitals shown include unvalidated device data.  Last Pain:  Vitals:   06/05/21 1028  TempSrc: Oral  PainSc: 0-No pain      Patients Stated Pain Goal: 4 (16/75/61 2548)  Complications: No notable events documented.

## 2021-06-05 NOTE — Anesthesia Postprocedure Evaluation (Signed)
Anesthesia Post Note  Patient: Kimberly Mann  Procedure(s) Performed: DILATATION AND CURETTAGE /HYSTEROSCOPY/MYOSURE RESECTION OF UTERINE FIBROIDS (Uterus)     Patient location during evaluation: PACU Anesthesia Type: General Level of consciousness: awake and alert Pain management: pain level controlled Vital Signs Assessment: post-procedure vital signs reviewed and stable Respiratory status: spontaneous breathing, nonlabored ventilation, respiratory function stable and patient connected to nasal cannula oxygen Cardiovascular status: blood pressure returned to baseline and stable Postop Assessment: no apparent nausea or vomiting Anesthetic complications: no   No notable events documented.  Last Vitals:  Vitals:   06/05/21 1308 06/05/21 1347  BP: (!) 160/83 (!) 141/89  Pulse: 78 77  Resp: (!) 21 14  Temp:  (!) 36.4 C  SpO2: 98% 100%    Last Pain:  Vitals:   06/05/21 1341  TempSrc:   PainSc: 6                  Muhammad Vacca

## 2021-06-05 NOTE — Telephone Encounter (Signed)
-----   Message from Lavonia Drafts, MD sent at 06/05/2021 12:10 PM EST ----- Please make 4 weeks f/u for pt   Thx,  Clh-S

## 2021-06-05 NOTE — Anesthesia Preprocedure Evaluation (Addendum)
Anesthesia Evaluation  Patient identified by MRN, date of birth, ID band Patient awake    Reviewed: Allergy & Precautions, NPO status , Patient's Chart, lab work & pertinent test results  History of Anesthesia Complications (+) PONV and history of anesthetic complications  Airway Mallampati: I  TM Distance: >3 FB Neck ROM: Full    Dental  (+) Edentulous Lower, Edentulous Upper   Pulmonary former smoker,    Pulmonary exam normal breath sounds clear to auscultation       Cardiovascular hypertension, Pt. on medications Normal cardiovascular exam Rhythm:Regular Rate:Normal     Neuro/Psych  Headaches, PSYCHIATRIC DISORDERS Anxiety    GI/Hepatic negative GI ROS, Neg liver ROS,   Endo/Other  Obesity   Renal/GU negative Renal ROS     Musculoskeletal  (+) Arthritis , Osteoarthritis,    Abdominal   Peds  Hematology negative hematology ROS (+)   Anesthesia Other Findings   Reproductive/Obstetrics                            Anesthesia Physical  Anesthesia Plan  ASA: 2  Anesthesia Plan: General   Post-op Pain Management:    Induction: Intravenous  PONV Risk Score and Plan: 4 or greater and Midazolam, Dexamethasone, Ondansetron, Scopolamine patch - Pre-op and TIVA  Airway Management Planned: LMA  Additional Equipment:   Intra-op Plan:   Post-operative Plan: Extubation in OR  Informed Consent: I have reviewed the patients History and Physical, chart, labs and discussed the procedure including the risks, benefits and alternatives for the proposed anesthesia with the patient or authorized representative who has indicated his/her understanding and acceptance.     Dental advisory given  Plan Discussed with: CRNA and Anesthesiologist  Anesthesia Plan Comments:        Anesthesia Quick Evaluation

## 2021-06-05 NOTE — Telephone Encounter (Signed)
Left message on VM informing patient of Postop appointment on 07/04/2021 at 3:100pm.  Advised patient to contact our office with any questions or concerns.

## 2021-06-05 NOTE — Discharge Instructions (Addendum)
°  Post Anesthesia Home Care Instructions  Activity: Get plenty of rest for the remainder of the day. A responsible individual must stay with you for 24 hours following the procedure.  For the next 24 hours, DO NOT: -Drive a car -Paediatric nurse -Drink alcoholic beverages -Take any medication unless instructed by your physician -Make any legal decisions or sign important papers.  Meals: Start with liquid foods such as gelatin or soup. Progress to regular foods as tolerated. Avoid greasy, spicy, heavy foods. If nausea and/or vomiting occur, drink only clear liquids until the nausea and/or vomiting subsides. Call your physician if vomiting continues.  Special Instructions/Symptoms: Your throat may feel dry or sore from the anesthesia or the breathing tube placed in your throat during surgery. If this causes discomfort, gargle with warm salt water. The discomfort should disappear within 24 hours.  If you had a scopolamine patch placed behind your ear for the management of post- operative nausea and/or vomiting:  1. The medication in the patch is effective for 72 hours, after which it should be removed.  Wrap patch in a tissue and discard in the trash. Wash hands thoroughly with soap and water. 2. You may remove the patch earlier than 72 hours if you experience unpleasant side effects which may include dry mouth, dizziness or visual disturbances. 3. Avoid touching the patch. Wash your hands with soap and water after contact with the patch.     No ibuprofen, Advil, Aleve, Motrin, ketorolac, meloxicam, naproxen, or other NSAIDS until after 7:45pm today if needed for pain. No acetaminophen/Tylenol until after 4:30pm today if needed for pain.  D & C Home care Instructions:   Personal hygiene:  Used sanitary napkins for vaginal drainage not tampons. Shower or tub bathe the day after your procedure. No douching until bleeding stops. Always wipe from front to back after  Elimination.  Activity:  Do not drive or operate any equipment today. The effects of the anesthesia are still present and drowsiness may result. Rest today, not necessarily flat bed rest, just take it easy. You may resume your normal activity in one to 2 days.  Sexual activity: No intercourse for one week or as indicated by your physician  Diet: Eat a light diet as desired this evening. You may resume a regular diet tomorrow.  Return to work: One to 2 days.  General Expectations of your surgery: Vaginal bleeding should be no heavier than a normal period. Spotting may continue up to 10 days. Mild cramps may continue for a couple of days. You may have a regular period in 2-6 weeks.  Unexpected observations call your doctor if these occur: persistent or heavy bleeding. Severe abdominal cramping or pain. Elevation of temperature greater than 100F.  Call for an appointment in one week.

## 2021-06-06 LAB — SURGICAL PATHOLOGY

## 2021-06-07 ENCOUNTER — Encounter (HOSPITAL_BASED_OUTPATIENT_CLINIC_OR_DEPARTMENT_OTHER): Payer: Self-pay | Admitting: Obstetrics & Gynecology

## 2021-06-07 ENCOUNTER — Other Ambulatory Visit: Payer: Self-pay | Admitting: Neurology

## 2021-06-07 DIAGNOSIS — M545 Low back pain, unspecified: Secondary | ICD-10-CM

## 2021-06-07 MED ORDER — OXYCODONE-ACETAMINOPHEN 10-325 MG PO TABS: 1.0000 | ORAL_TABLET | Freq: Four times a day (QID) | ORAL | 0 refills | Status: DC | PRN

## 2021-06-07 NOTE — Telephone Encounter (Signed)
Pt request refill for oxyCODONE-acetaminophen (PERCOCET) 10-325 MG tablet at [id number] 

## 2021-06-11 ENCOUNTER — Other Ambulatory Visit: Payer: Self-pay | Admitting: Family Medicine

## 2021-06-12 ENCOUNTER — Ambulatory Visit (INDEPENDENT_AMBULATORY_CARE_PROVIDER_SITE_OTHER): Payer: Medicare HMO | Admitting: Family Medicine

## 2021-06-12 ENCOUNTER — Encounter: Payer: Self-pay | Admitting: Family Medicine

## 2021-06-12 VITALS — BP 132/80 | HR 88 | Temp 98.2°F | Ht 64.5 in | Wt 223.5 lb

## 2021-06-12 DIAGNOSIS — J302 Other seasonal allergic rhinitis: Secondary | ICD-10-CM | POA: Diagnosis not present

## 2021-06-12 DIAGNOSIS — E559 Vitamin D deficiency, unspecified: Secondary | ICD-10-CM | POA: Diagnosis not present

## 2021-06-12 DIAGNOSIS — I10 Essential (primary) hypertension: Secondary | ICD-10-CM

## 2021-06-12 DIAGNOSIS — R7303 Prediabetes: Secondary | ICD-10-CM | POA: Diagnosis not present

## 2021-06-12 LAB — TSH: TSH: 1.25 u[IU]/mL (ref 0.35–5.50)

## 2021-06-12 LAB — CBC
HCT: 42.1 % (ref 36.0–46.0)
Hemoglobin: 13.7 g/dL (ref 12.0–15.0)
MCHC: 32.5 g/dL (ref 30.0–36.0)
MCV: 91 fl (ref 78.0–100.0)
Platelets: 224 10*3/uL (ref 150.0–400.0)
RBC: 4.63 Mil/uL (ref 3.87–5.11)
RDW: 14.4 % (ref 11.5–15.5)
WBC: 6.1 10*3/uL (ref 4.0–10.5)

## 2021-06-12 LAB — VITAMIN D 25 HYDROXY (VIT D DEFICIENCY, FRACTURES): VITD: 55.42 ng/mL (ref 30.00–100.00)

## 2021-06-12 LAB — HEMOGLOBIN A1C: Hgb A1c MFr Bld: 5.6 % (ref 4.6–6.5)

## 2021-06-12 MED ORDER — LEVOCETIRIZINE DIHYDROCHLORIDE 5 MG PO TABS: 5.0000 mg | ORAL_TABLET | Freq: Every evening | ORAL | 2 refills | Status: DC

## 2021-06-12 MED ORDER — AZITHROMYCIN 250 MG PO TABS: ORAL_TABLET | ORAL | 0 refills | Status: DC

## 2021-06-12 MED ORDER — MONTELUKAST SODIUM 10 MG PO TABS: 10.0000 mg | ORAL_TABLET | Freq: Every day | ORAL | 2 refills | Status: DC

## 2021-06-12 MED ORDER — PANTOPRAZOLE SODIUM 40 MG PO TBEC: 40.0000 mg | DELAYED_RELEASE_TABLET | Freq: Every day | ORAL | 3 refills | Status: AC

## 2021-06-12 NOTE — Progress Notes (Signed)
Chief Complaint  Patient presents with   Follow-up    6 month    Subjective Kimberly Mann is a 66 y.o. female who presents for hypertension follow up. She does monitor home blood pressures. Blood pressures ranging from 120-130's/80's on average. She is compliant with medications- triamterene HCTZ 37.5-25 mg/d. Patient has these side effects of medication: none She is sometimes adhering to a healthy diet overall. Current exercise: walking No Cp or SOB.  Allergies: seasonal allergie taking Xyzal 5 mg/d, Singulair 10 mg/d, Flonase daily. No AE's, reports compliance. Ss's controlled on this combo. No fevers, coughing, wheezing.    Past Medical History:  Diagnosis Date   Anxiety    Cancer ([place])    breast   Chronic back pain    takes oxycodone   Complication of anesthesia    Eczema    Environmental allergies    Headache    Hypertension    PONV (postoperative nausea and vomiting)    Vertigo     Exam BP 132/80    Pulse 88    Temp 98.2 F (36.8 C) (Oral)    Ht 5' 4.5" (1.638 m)    Wt 223 lb 8 oz (101.4 kg)    SpO2 99%    BMI 37.77 kg/m  General:  well developed, well nourished, in no apparent distress Heart: RRR, no bruits, no LE edema Lungs: clear to auscultation, no accessory muscle use Psych: well oriented with normal range of affect and appropriate judgment/insight  Essential hypertension - Plan: TSH, Comprehensive metabolic panel  Seasonal allergies - Plan: levocetirizine (XYZAL) 5 MG tablet, montelukast (SINGULAIR) 10 MG tablet  Vitamin D deficiency - Plan: VITAMIN D 25 Hydroxy (Vit-D Deficiency, Fractures)  Prediabetes - Plan: Lipid panel, CBC, Hemoglobin A1c  Chronic, stable. Cont Maxzide HCT 37.5-25 mg/d. Counseled on diet and exercise. Chronic, stable. Cont Xyzal 5 mg/d, INCS, Singulair 10 mg/d.  F/u in 6 mo for CPE or prn. The patient voiced understanding and agreement to the plan.  Anderson, DO 06/12/21  11:55 AM

## 2021-06-12 NOTE — Patient Instructions (Addendum)
Keep the diet clean and stay active.  Aim to do some physical exertion for 150 minutes per week. This is typically divided into 5 days per week, 30 minutes per day. The activity should be enough to get your heart rate up. Anything is better than nothing if you have time constraints.  Give [place] 2-3 business days to get the results of your labs back.  Let us know if you need anything.

## 2021-06-13 ENCOUNTER — Encounter: Payer: Medicare HMO | Admitting: Obstetrics & Gynecology

## 2021-06-13 ENCOUNTER — Other Ambulatory Visit: Payer: Self-pay | Admitting: Family Medicine

## 2021-06-13 DIAGNOSIS — E1169 Type 2 diabetes mellitus with other specified complication: Secondary | ICD-10-CM

## 2021-06-13 LAB — COMPREHENSIVE METABOLIC PANEL
ALT: 13 U/L (ref 0–35)
AST: 15 U/L (ref 0–37)
Albumin: 4.3 g/dL (ref 3.5–5.2)
Alkaline Phosphatase: 59 U/L (ref 39–117)
BUN: 25 mg/dL — ABNORMAL HIGH (ref 6–23)
CO2: 26 mEq/L (ref 19–32)
Calcium: 9.2 mg/dL (ref 8.4–10.5)
Chloride: 104 mEq/L (ref 96–112)
Creatinine, Ser: 0.87 mg/dL (ref 0.40–1.20)
GFR: 69.18 mL/min (ref >60.00)
Glucose, Bld: 98 mg/dL (ref 70–99)
Potassium: 3.9 mEq/L (ref 3.5–5.1)
Sodium: 141 mEq/L (ref 135–145)
Total Bilirubin: 0.3 mg/dL (ref 0.2–1.2)
Total Protein: 7.8 g/dL (ref 6.0–8.3)

## 2021-06-13 LAB — LIPID PANEL
Cholesterol: 234 mg/dL — ABNORMAL HIGH (ref 0–200)
HDL: 46.7 mg/dL (ref >39.00)
LDL Cholesterol: 155 mg/dL — ABNORMAL HIGH (ref 0–99)
NonHDL: 187.14
Total CHOL/HDL Ratio: 5
Triglycerides: 159 mg/dL — ABNORMAL HIGH (ref 0.0–149.0)
VLDL: 31.8 mg/dL (ref 0.0–40.0)

## 2021-07-04 ENCOUNTER — Other Ambulatory Visit: Payer: Self-pay

## 2021-07-04 ENCOUNTER — Ambulatory Visit (INDEPENDENT_AMBULATORY_CARE_PROVIDER_SITE_OTHER): Payer: Medicare Other | Admitting: Obstetrics & Gynecology

## 2021-07-04 ENCOUNTER — Encounter: Payer: Self-pay | Admitting: Obstetrics & Gynecology

## 2021-07-04 VITALS — BP 141/100 | HR 100 | Wt 226.0 lb

## 2021-07-04 DIAGNOSIS — N950 Postmenopausal bleeding: Secondary | ICD-10-CM

## 2021-07-04 NOTE — Progress Notes (Signed)
Patient presents for post op D&C/hysteroscopy/myosure. ?Patient states she is currently dealing with vertigo. Kathrene Alu RN  ?

## 2021-07-04 NOTE — Progress Notes (Signed)
History:  ?66 y.o.LMP here today for 2 week post op check.Pt is s/p hysteroscopy with endometrial ablation using Novasure on 06/05/2021.  Pt reports that she is doing well. She has had no bleeding since the procedure.    ? ?The following portions of the patient's history were reviewed and updated as appropriate: allergies, current medications, past family history, past medical history, past social history, past surgical history and problem list. ? ?Review of Systems:  ?Pertinent items are noted in HPI. ?   ?Objective:  ?Physical Exam ?BP (!) 141/100   Pulse 100   Wt 226 lb (102.5 kg)   BMI 38.19 kg/m?  ? ?CONSTITUTIONAL: Well-developed, well-nourished female in no acute distress.  ?HENT:  Normocephalic, atraumatic ?EYES: Conjunctivae and EOM are normal. No scleral icterus.  ?NECK: Normal range of motion ?SKIN: Skin is warm and dry. No rash noted. Not diaphoretic.No pallor. ?[place]: Alert and oriented to person, place, and time. Normal coordination.  ?Abd: Soft, nontender and nondistended; her port sites are healing well. Marland Kitchen  ?Pelvic: deferred ? ?Labs and Imaging ?Surg path 06/05/2021 ?FINAL MICROSCOPIC DIAGNOSIS:  ? ?A. ENDOMETRIAL CURETTINGS:  ?- Fragments of benign endometrial polyp.  ?- Inactive endometrium.  ?- Benign myometrium.  ?- No hyperplasia or malignancy.   ? ?Assessment & Plan:  ?4 week post op check following hysteroscopy with endometrial ablation using Novasure ? Doing well ? Reviewed her surg path.  ? Reviewed post op instructions and activities ? Gradual increase in activities ? F/u in 1 year or sooner prn ? All questions answered.  ? ?Amous Crewe L. Harraway-Smith, M.D., [place]  ?

## 2021-07-09 ENCOUNTER — Other Ambulatory Visit: Payer: Self-pay | Admitting: Neurology

## 2021-07-09 DIAGNOSIS — M545 Low back pain, unspecified: Secondary | ICD-10-CM

## 2021-07-09 MED ORDER — OXYCODONE-ACETAMINOPHEN 10-325 MG PO TABS: 1.0000 | ORAL_TABLET | Freq: Four times a day (QID) | ORAL | 0 refills | Status: DC | PRN

## 2021-07-09 NOTE — Telephone Encounter (Signed)
Pt is requesting a refill for oxyCODONE-acetaminophen (PERCOCET) 10-325 MG tablet .  Pharmacy: [id number]   

## 2021-07-12 ENCOUNTER — Encounter: Payer: Self-pay | Admitting: Neurology

## 2021-07-12 ENCOUNTER — Telehealth: Payer: Self-pay | Admitting: Neurology

## 2021-07-12 NOTE — Telephone Encounter (Signed)
Pt at [facility]. Need PA for the 120 count for Oxycodone. Would like a call from the nurse ?

## 2021-07-12 NOTE — Telephone Encounter (Signed)
Called the pharmacy and waited on hold for 12 min for them to state that I needed to call the insurance and complete a 2nd PA which made no sense. I called the insurance and spoke with Shanon Brow and he states the patient has to pick up a 7 day trial to "try" the medication first.  ?Advised this patient has been on this medication for years and if they can override this. He states that once she fills the 7 day supply she can get the 30 day supply after that no problem.  ?It is because it is new insurance ? ?The insurance rep reached out to the pharmacist team and they are having the authorization reviewed to see if they can update this and will call us back. I have requested the contact the pharmacy as our office is closed and will be closed tomorrow as well. I will send a update to the patient.  ?

## 2021-07-12 NOTE — Telephone Encounter (Signed)
Pt confirmed new insurance is [employer]. Nurse working on authorization for extra coverage  allows 1 tab every 8 hrs. ? ? ?

## 2021-07-12 NOTE — Telephone Encounter (Addendum)
[facility] called stating that two PA are needed for the pt's medication. The PA for the qt was approved but not for the drug. Pt is waiting in the pharmacy now.  Phone number that was given to obtain second PA is [phone] ?

## 2021-07-12 NOTE — Telephone Encounter (Signed)
Received a notification from the pharmacy that the pt insurance will only cover every 6 hours vs the every 8 hours she has been on for years.  ?Called the pt to make her aware of this. There was no answer. Left the pt a message advising of this. ? ?Attempted PA on CMM and it was unable to find pt. Called the optumrx-  [phone]. ?Spoke with Stanton Kidney on the phone to complete the PA for the 120 tab/30 days. It was approved for the pt until 08/11/2021 ?[id number]. ? ?Called the pt and made her aware of this information. She is going to attempt to switch back to [facility] insurance to avoid this but advised if that change was not made prior to when she is due again, she may want to call us a week before so we can renew the [place].  ?Pt verbalized understanding. ? ?

## 2021-07-12 NOTE — Telephone Encounter (Signed)
Pt called stating that when she went to pick up her oxyCODONE-acetaminophen (PERCOCET) 10-325 MG tablet they only gave her 7 days worth. Pt states she normally gets it for 30 days and she is thinking that its because she changed insurances. Pt states that pharmacist informed her that the provider has to rewrite the prescription in order for her to receive the 30 day qt. Pt would like RN to call and discuss how and when this can be fixed.  ?

## 2021-07-27 ENCOUNTER — Other Ambulatory Visit (INDEPENDENT_AMBULATORY_CARE_PROVIDER_SITE_OTHER): Payer: Medicare Other

## 2021-07-27 ENCOUNTER — Other Ambulatory Visit: Payer: Self-pay | Admitting: Family Medicine

## 2021-07-27 DIAGNOSIS — E1169 Type 2 diabetes mellitus with other specified complication: Secondary | ICD-10-CM | POA: Diagnosis not present

## 2021-07-27 DIAGNOSIS — E785 Hyperlipidemia, unspecified: Secondary | ICD-10-CM | POA: Diagnosis not present

## 2021-07-27 LAB — LIPID PANEL
Cholesterol: 207 mg/dL — ABNORMAL HIGH (ref 0–200)
HDL: 46.7 mg/dL (ref >39.00)
LDL Cholesterol: 137 mg/dL — ABNORMAL HIGH (ref 0–99)
NonHDL: 160.78
Total CHOL/HDL Ratio: 4
Triglycerides: 121 mg/dL (ref 0.0–149.0)
VLDL: 24.2 mg/dL (ref 0.0–40.0)

## 2021-08-06 ENCOUNTER — Other Ambulatory Visit: Payer: Self-pay | Admitting: Neurology

## 2021-08-06 DIAGNOSIS — M545 Low back pain, unspecified: Secondary | ICD-10-CM

## 2021-08-06 MED ORDER — OXYCODONE-ACETAMINOPHEN 10-325 MG PO TABS: 1.0000 | ORAL_TABLET | Freq: Four times a day (QID) | ORAL | 0 refills | Status: DC | PRN

## 2021-08-06 NOTE — Telephone Encounter (Signed)
Pt requesting refill for oxyCODONE-acetaminophen (PERCOCET) 10-325 MG tablet at [place] [facility]  ?

## 2021-08-27 ENCOUNTER — Other Ambulatory Visit: Payer: Self-pay | Admitting: Family Medicine

## 2021-08-28 ENCOUNTER — Ambulatory Visit (INDEPENDENT_AMBULATORY_CARE_PROVIDER_SITE_OTHER): Payer: Medicare HMO | Admitting: Neurology

## 2021-08-28 ENCOUNTER — Encounter: Payer: Self-pay | Admitting: Neurology

## 2021-08-28 VITALS — BP 180/109 | HR 87 | Ht 64.5 in | Wt 226.0 lb

## 2021-08-28 DIAGNOSIS — M5431 Sciatica, right side: Secondary | ICD-10-CM | POA: Diagnosis not present

## 2021-08-28 DIAGNOSIS — M7552 Bursitis of left shoulder: Secondary | ICD-10-CM

## 2021-08-28 DIAGNOSIS — M545 Low back pain, unspecified: Secondary | ICD-10-CM | POA: Diagnosis not present

## 2021-08-28 DIAGNOSIS — M5432 Sciatica, left side: Secondary | ICD-10-CM | POA: Diagnosis not present

## 2021-08-28 DIAGNOSIS — M7062 Trochanteric bursitis, left hip: Secondary | ICD-10-CM

## 2021-08-28 DIAGNOSIS — M542 Cervicalgia: Secondary | ICD-10-CM | POA: Diagnosis not present

## 2021-08-28 DIAGNOSIS — M7551 Bursitis of right shoulder: Secondary | ICD-10-CM

## 2021-08-28 DIAGNOSIS — C50919 Malignant neoplasm of unspecified site of unspecified female breast: Secondary | ICD-10-CM | POA: Diagnosis not present

## 2021-08-28 MED ORDER — HYDROXYZINE PAMOATE 25 MG PO CAPS: 25.0000 mg | ORAL_CAPSULE | Freq: Three times a day (TID) | ORAL | 5 refills | Status: DC | PRN

## 2021-08-28 MED ORDER — LIDOCAINE 5 % EX OINT: 1.0000 "application " | TOPICAL_OINTMENT | CUTANEOUS | 5 refills | Status: DC | PRN

## 2021-08-28 MED ORDER — OXYCODONE-ACETAMINOPHEN 10-325 MG PO TABS: 1.0000 | ORAL_TABLET | Freq: Four times a day (QID) | ORAL | 0 refills | Status: DC | PRN

## 2021-08-28 MED ORDER — FLUCONAZOLE 150 MG PO TABS: 150.0000 mg | ORAL_TABLET | Freq: Every day | ORAL | 0 refills | Status: DC

## 2021-08-28 NOTE — Progress Notes (Signed)
t ? ?[facility] ? ?PATIENT: Kimberly Mann ?DOB: 06/19/1955 ? ?REFERRING CLINICIAN: [name]  ?HISTORY FROM: Patient  ?REASON FOR VISIT: Left shoulder and neck pain; back pain ? ? ?HISTORICAL ? ?CHIEF COMPLAINT:  ?Chief Complaint  ?Patient presents with  ? Follow-up  ?  Rm 16, alone. Here for 4 month f/u. Pt c/o of worsening back and hip pn. Pt reports it may due to shots wearing off. Needing diflucan ordered.   ? ? ?HISTORY OF PRESENT ILLNESS:  ?Kimberly Mann is a 66 y.o. woman with neck, back, hip and shoulder pain. ? ?Update 08/28/2021 ?She did well after her injections 4 months ago but pain is worsening again in back, hips and shoulders ? ?Also has some right > left shoulder.  In the past, shoulder pain has improved after subacromial bursa injections. ? ?Currently, she has back pain and hip pain.   The worst pain is in the hips, right > left  P pain worsens with standing or walking.   We have done trochanteric bursae injections several times at with benefit.  Other times piriformis muscle injections have been helpful.  She also reports bilateral shoulder pain with some neck pain.   ? ?She had a bone density test showing osteopenia.   She does get steroid injection from [place] 3 times a year (usually 80 mg Depo-Medrol) and also has had some steroid packs    the shots help her more than the steroid packs.  We discussed that we need to continue to try to limit the injections to just a couple each year. ? ?She has migraine headaches twice a week. Imitrex has not worked in the past.  Fioricet has helped her some in the past but she has not taken for 3-4/year. ? ?She has some depression but is doing better than earlier in the year.  Her mother died in 08-10-2018.  She has breast cancer,  She is done with [facility]Rx and still is on tamoxifen.     ? ?MRI Imaging: ?MRI cervical spine 03/06/2017:  Shallow broad-based central protrusion at C4-5 slightly deforms the  ?cord but the central canal and foramina are open.     Mild right foraminal narrowing at C5-6 due to uncovertebral  ?spurring. The central canal and left foramen are open at this level.    Based on comparison with the report of the prior MRI, the appearance of the cervical spine is unchanged.  ? ?MRI brain 10/06/2019 [facility]):  This MRI of the brain with and without contrast with added attention to the internal auditory canals shows the following:   The 8th nerves and the internal auditory canals appear normal. Scattered T2/FLAIR hyperintense foci in the subcortical and deep white matter consistent with chronic microvascular ischemic change.  None of the foci appear to be acute.  There is a normal enhancement pattern and no acute findings. ? ?  ?REVIEW OF SYSTEMS:  ?Constitutional: No fevers, chills, sweats, or change in appetite ?Eyes: No visual changes, double vision, eye pain ?Ear, nose and throat: No hearing loss, ear pain, nasal congestion, sore throat ?Cardiovascular: No chest pain, palpitations ?Respiratory:  No shortness of breath at rest or with exertion.   No wheezes ?GastrointestinaI: No nausea, vomiting, diarrhea, abdominal pain, fecal incontinence ?Genitourinary:  No dysuria, urinary retention or frequency.  No nocturia. ?Musculoskeletal: as above ?Integumentary: No rash, pruritus, skin lesions ?Neurological: as above ?Psychiatric: She has som depression since breast cancer dx.  ?Endocrine: No palpitations, diaphoresis, change in appetite,  change in weigh or increased thirst ? ?ALLERGIES: ?Allergies  ?Allergen Reactions  ? Morphine And Related Nausea And Vomiting and Swelling  ? Penicillins   ? Sulfa Antibiotics   ? ? ?HOME MEDICATIONS: ?Outpatient Medications Prior to Visit  ?Medication Sig Dispense Refill  ? amlodipine-atorvastatin (CADUET) 2.5-40 MG tablet Take 1 tablet by mouth daily.    ? butalbital-acetaminophen-caffeine (FIORICET) 50-325-40 MG tablet Take one po qd prn headache 15 tablet 3  ? diclofenac sodium (VOLTAREN) 1 % GEL Apply 2  g topically 4 (four) times daily as needed. 200 g 5  ? fluticasone (FLONASE) 50 MCG/ACT nasal spray Use 2 spray(s) in each nostril once daily 16 g 3  ? levocetirizine (XYZAL) 5 MG tablet Take 1 tablet (5 mg total) by mouth every evening. 90 tablet 2  ? montelukast (SINGULAIR) 10 MG tablet Take 1 tablet (10 mg total) by mouth at bedtime. 90 tablet 2  ? Multiple Vitamins-Minerals (MULTIVITAMIN ADULT PO)     ? ondansetron (ZOFRAN-ODT) 4 MG disintegrating tablet Take 1 tablet (4 mg total) by mouth every 6 (six) hours as needed for nausea. 10 tablet 0  ? pantoprazole (PROTONIX) 40 MG tablet Take 1 tablet (40 mg total) by mouth daily. 30 tablet 3  ? tamoxifen (NOLVADEX) 20 MG tablet Take 20 mg by mouth daily.    ? triamterene-hydrochlorothiazide (MAXZIDE-25) 37.5-25 MG tablet Take 1/2 (one-half) tablet by mouth once daily 90 tablet 0  ? Vitamin D, Ergocalciferol, (DRISDOL) 1.25 MG (50000 UNIT) CAPS capsule Take 1 capsule by mouth once a week 12 capsule 0  ? hydrOXYzine (VISTARIL) 25 MG capsule Take 1 capsule (25 mg total) by mouth 3 (three) times daily as needed. 60 capsule 2  ? lidocaine (XYLOCAINE) 5 % ointment Apply 1 application topically as needed. 35.44 g 5  ? oxyCODONE-acetaminophen (PERCOCET) 10-325 MG tablet Take 1 tablet by mouth every 6 (six) hours as needed for pain. 120 tablet 0  ? ?No facility-administered medications prior to visit.  ? ? ?PAST MEDICAL HISTORY: ?Past Medical History:  ?Diagnosis Date  ? Anxiety   ? Cancer [facility])   ? breast  ? Chronic back pain   ? takes oxycodone  ? Complication of anesthesia   ? Eczema   ? Environmental allergies   ? Headache   ? Hypertension   ? PONV (postoperative nausea and vomiting)   ? Vertigo   ? ? ?PAST SURGICAL HISTORY: ?Past Surgical History:  ?Procedure Laterality Date  ? BREAST REDUCTION SURGERY Left 11/20/2017  ? Procedure: left breast reduction for symmetry and liposuction bilateral lateral breasts;  Surgeon: Wallace Going, DO;  Location: [place];  Service: Plastics;  Laterality: Left;  ? BREAST SURGERY    ? rt breast lumpectomy  ? HYSTEROSCOPY WITH D & C N/A 06/05/2021  ? Procedure: DILATATION AND CURETTAGE /HYSTEROSCOPY/MYOSURE RESECTION OF UTERINE FIBROIDS;  Surgeon: Lavonia Drafts, MD;  Location: [place];  Service: Gynecology;  Laterality: N/A;  ? LIPOSUCTION    ? TUBAL LIGATION    ? ? ?FAMILY HISTORY: ?Family History  ?Problem Relation Age of Onset  ? Hypertension Mother   ? ? ?SOCIAL HISTORY: ? ?Social History  ? ?Socioeconomic History  ? Marital status: Legally Separated  ?  Spouse name: Not on file  ? Number of children: Not on file  ? Years of education: Not on file  ? Highest education level: Not on file  ?Occupational History  ? Not on file  ?Tobacco Use  ?  Smoking status: Former  ?  Types: Cigarettes  ?  Quit date: 05/17/1974  ?  Years since quitting: 47.3  ? Smokeless tobacco: Never  ?Vaping Use  ? Vaping Use: Never used  ?Substance and Sexual Activity  ? Alcohol use: Not Currently  ?  Alcohol/week: 7.0 standard drinks  ?  Types: 7 Standard drinks or equivalent per week  ? Drug use: No  ?  Types: Marijuana  ?  Comment: Last use 06/04/21  ? Sexual activity: Not on file  ?Other Topics Concern  ? Not on file  ?Social History Narrative  ? Not on file  ? ?Social Determinants of Health  ? ?Financial Resource Strain: Low Risk   ? Difficulty of Paying Living Expenses: Not hard at all  ?Food Insecurity: No Food Insecurity  ? Worried About Charity fundraiser in the Last Year: Never true  ? Ran Out of Food in the Last Year: Never true  ?Transportation Needs: No Transportation Needs  ? Lack of Transportation (Medical): No  ? Lack of Transportation (Non-Medical): No  ?Physical Activity: Inactive  ? Days of Exercise per Week: 0 days  ? Minutes of Exercise per Session: 0 min  ?Stress: No Stress Concern Present  ? Feeling of Stress : Not at all  ?Social Connections: Moderately Isolated  ? Frequency of Communication with  Friends and Family: More than three times a week  ? Frequency of Social Gatherings with Friends and Family: More than three times a week  ? Attends Religious Services: More than 4 times per year  ? Active Me

## 2021-08-29 ENCOUNTER — Ambulatory Visit: Payer: Medicare Other | Admitting: Obstetrics & Gynecology

## 2021-09-04 DIAGNOSIS — H40033 Anatomical narrow angle, bilateral: Secondary | ICD-10-CM | POA: Diagnosis not present

## 2021-09-20 DIAGNOSIS — Z5181 Encounter for therapeutic drug level monitoring: Secondary | ICD-10-CM | POA: Diagnosis not present

## 2021-09-20 DIAGNOSIS — Z853 Personal history of malignant neoplasm of breast: Secondary | ICD-10-CM | POA: Diagnosis not present

## 2021-09-20 DIAGNOSIS — Z170 Estrogen receptor positive status [ER+]: Secondary | ICD-10-CM | POA: Diagnosis not present

## 2021-09-20 DIAGNOSIS — Z79810 Long term (current) use of selective estrogen receptor modulators (SERMs): Secondary | ICD-10-CM | POA: Diagnosis not present

## 2021-09-20 DIAGNOSIS — Z923 Personal history of irradiation: Secondary | ICD-10-CM | POA: Diagnosis not present

## 2021-09-20 DIAGNOSIS — C50511 Malignant neoplasm of lower-outer quadrant of right female breast: Secondary | ICD-10-CM | POA: Diagnosis not present

## 2021-10-08 ENCOUNTER — Other Ambulatory Visit: Payer: Self-pay | Admitting: Neurology

## 2021-10-08 DIAGNOSIS — M545 Low back pain, unspecified: Secondary | ICD-10-CM

## 2021-10-08 MED ORDER — OXYCODONE-ACETAMINOPHEN 10-325 MG PO TABS: 1.0000 | ORAL_TABLET | Freq: Four times a day (QID) | ORAL | 0 refills | Status: DC | PRN

## 2021-10-08 NOTE — Telephone Encounter (Signed)
Pt is requesting a refill for oxyCODONE-acetaminophen (PERCOCET) 10-325 MG tablet .  Pharmacy: [id number]   

## 2021-10-17 DIAGNOSIS — R300 Dysuria: Secondary | ICD-10-CM | POA: Diagnosis not present

## 2021-10-17 DIAGNOSIS — N390 Urinary tract infection, site not specified: Secondary | ICD-10-CM | POA: Diagnosis not present

## 2021-10-17 DIAGNOSIS — Z6838 Body mass index (BMI) 38.0-38.9, adult: Secondary | ICD-10-CM | POA: Diagnosis not present

## 2021-10-22 DIAGNOSIS — Z1231 Encounter for screening mammogram for malignant neoplasm of breast: Secondary | ICD-10-CM | POA: Diagnosis not present

## 2021-10-24 DIAGNOSIS — C50511 Malignant neoplasm of lower-outer quadrant of right female breast: Secondary | ICD-10-CM | POA: Diagnosis not present

## 2021-10-24 DIAGNOSIS — Z170 Estrogen receptor positive status [ER+]: Secondary | ICD-10-CM | POA: Diagnosis not present

## 2021-11-05 ENCOUNTER — Other Ambulatory Visit: Payer: Self-pay | Admitting: Neurology

## 2021-11-05 DIAGNOSIS — M545 Low back pain, unspecified: Secondary | ICD-10-CM

## 2021-11-05 MED ORDER — OXYCODONE-ACETAMINOPHEN 10-325 MG PO TABS: 1.0000 | ORAL_TABLET | Freq: Four times a day (QID) | ORAL | 0 refills | Status: DC | PRN

## 2021-11-05 NOTE — Telephone Encounter (Signed)
Pt is needing a refill on her oxyCODONE-acetaminophen (PERCOCET) 10-325 MG tablet sent to the [facility] on [address] 

## 2021-11-11 ENCOUNTER — Other Ambulatory Visit: Payer: Self-pay | Admitting: Family Medicine

## 2021-11-29 ENCOUNTER — Encounter: Payer: Self-pay | Admitting: General Practice

## 2021-12-04 ENCOUNTER — Other Ambulatory Visit: Payer: Self-pay | Admitting: Neurology

## 2021-12-04 DIAGNOSIS — M545 Low back pain, unspecified: Secondary | ICD-10-CM

## 2021-12-04 MED ORDER — OXYCODONE-ACETAMINOPHEN 10-325 MG PO TABS: 1.0000 | ORAL_TABLET | Freq: Four times a day (QID) | ORAL | 0 refills | Status: DC | PRN

## 2021-12-04 NOTE — Telephone Encounter (Signed)
Pt request refill foroxyCODONE-acetaminophen (PERCOCET) 10-325 MG tablet at [place] [phone]

## 2021-12-14 ENCOUNTER — Encounter: Payer: Self-pay | Admitting: Family Medicine

## 2021-12-14 ENCOUNTER — Ambulatory Visit (INDEPENDENT_AMBULATORY_CARE_PROVIDER_SITE_OTHER): Payer: Medicare HMO | Admitting: Family Medicine

## 2021-12-14 VITALS — BP 132/84 | HR 100 | Resp 18 | Ht 64.5 in | Wt 223.0 lb

## 2021-12-14 DIAGNOSIS — R7303 Prediabetes: Secondary | ICD-10-CM

## 2021-12-14 DIAGNOSIS — J302 Other seasonal allergic rhinitis: Secondary | ICD-10-CM

## 2021-12-14 DIAGNOSIS — H9209 Otalgia, unspecified ear: Secondary | ICD-10-CM

## 2021-12-14 DIAGNOSIS — E559 Vitamin D deficiency, unspecified: Secondary | ICD-10-CM | POA: Diagnosis not present

## 2021-12-14 DIAGNOSIS — E1169 Type 2 diabetes mellitus with other specified complication: Secondary | ICD-10-CM | POA: Diagnosis not present

## 2021-12-14 DIAGNOSIS — E785 Hyperlipidemia, unspecified: Secondary | ICD-10-CM

## 2021-12-14 DIAGNOSIS — Z0000 Encounter for general adult medical examination without abnormal findings: Secondary | ICD-10-CM

## 2021-12-14 LAB — CBC
HCT: 44.6 % (ref 36.0–46.0)
Hemoglobin: 14.6 g/dL (ref 12.0–15.0)
MCHC: 32.8 g/dL (ref 30.0–36.0)
MCV: 90.7 fl (ref 78.0–100.0)
Platelets: 213 10*3/uL (ref 150.0–400.0)
RBC: 4.92 Mil/uL (ref 3.87–5.11)
RDW: 14.4 % (ref 11.5–15.5)
WBC: 4.5 10*3/uL (ref 4.0–10.5)

## 2021-12-14 LAB — LIPID PANEL
Cholesterol: 233 mg/dL — ABNORMAL HIGH (ref 0–200)
HDL: 51.4 mg/dL (ref >39.00)
LDL Cholesterol: 146 mg/dL — ABNORMAL HIGH (ref 0–99)
NonHDL: 181.34
Total CHOL/HDL Ratio: 5
Triglycerides: 178 mg/dL — ABNORMAL HIGH (ref 0.0–149.0)
VLDL: 35.6 mg/dL (ref 0.0–40.0)

## 2021-12-14 LAB — COMPREHENSIVE METABOLIC PANEL
ALT: 14 U/L (ref 0–35)
AST: 16 U/L (ref 0–37)
Albumin: 4.3 g/dL (ref 3.5–5.2)
Alkaline Phosphatase: 64 U/L (ref 39–117)
BUN: 19 mg/dL (ref 6–23)
CO2: 27 mEq/L (ref 19–32)
Calcium: 9.1 mg/dL (ref 8.4–10.5)
Chloride: 101 mEq/L (ref 96–112)
Creatinine, Ser: 0.87 mg/dL (ref 0.40–1.20)
GFR: 68.93 mL/min (ref >60.00)
Glucose, Bld: 95 mg/dL (ref 70–99)
Potassium: 3.6 mEq/L (ref 3.5–5.1)
Sodium: 141 mEq/L (ref 135–145)
Total Bilirubin: 0.3 mg/dL (ref 0.2–1.2)
Total Protein: 7.2 g/dL (ref 6.0–8.3)

## 2021-12-14 LAB — VITAMIN D 25 HYDROXY (VIT D DEFICIENCY, FRACTURES): VITD: 53.31 ng/mL (ref 30.00–100.00)

## 2021-12-14 LAB — HEMOGLOBIN A1C: Hgb A1c MFr Bld: 5.8 % (ref 4.6–6.5)

## 2021-12-14 MED ORDER — FLUTICASONE PROPIONATE 50 MCG/ACT NA SUSP: 2.0000 | Freq: Every day | NASAL | 3 refills | Status: DC

## 2021-12-14 MED ORDER — METHYLPREDNISOLONE ACETATE 80 MG/ML IJ SUSP
80.0000 mg | Freq: Once | INTRAMUSCULAR | Status: AC
  Administered 2021-12-14: 80 mg via INTRAMUSCULAR

## 2021-12-14 NOTE — Patient Instructions (Signed)
Give [place] 2-3 business days to get the results of your labs back.   Keep the diet clean and stay active.  The Shingrix vaccine (for shingles) is a 2 shot series spaced 2-6 months apart. It can make people feel low energy, achy and almost like they have the flu for 48 hours after injection. 1/5 people can have nausea and/or vomiting. Please plan accordingly when deciding on when to get this shot. Call our office for a nurse visit appointment to get this. The second shot of the series is less severe regarding the side effects, but it still lasts 48 hours.   I recommend getting the flu shot in mid October. This suggestion would change if the CDC comes out with a different recommendation.   Let us know if you need anything.

## 2021-12-14 NOTE — Progress Notes (Signed)
Chief Complaint  Patient presents with   Annual Exam     Well Woman Kimberly Mann is here for a complete physical.   Her last physical was >1 year ago.  Current diet: in general, a "healthy" diet. Current exercise: walking, lifting wts Weight is stable and she denies daytime fatigue. Seatbelt? Yes Advanced directive? No  Health Maintenance Colonoscopy- Yes Shingrix- No DEXA- Yes Mammogram- Yes Tetanus- Yes Pneumonia- Due Hep C screen- Yes  Seasonal allergies Hx of seasonal allergies on Xyzal 5 mg/d and Singulair 10 mg/d. Reports compliance, no AE's. Still having sneezing, runny nose, congestion, and dizziness from ear issues. She has not been using her Flonase. She saw an allergist in the past and was allergic to cat/dog dander, various environmental allergies.   Past Medical History:  Diagnosis Date   Anxiety    Cancer ([place])    breast   Chronic back pain    takes oxycodone   Complication of anesthesia    Eczema    Environmental allergies    Headache    Hypertension    PONV (postoperative nausea and vomiting)    Vertigo      Past Surgical History:  Procedure Laterality Date   BREAST REDUCTION SURGERY Left 11/20/2017   Procedure: left breast reduction for symmetry and liposuction bilateral lateral breasts;  Surgeon: Wallace Going, DO;  Location: [place];  Service: Plastics;  Laterality: Left;   BREAST SURGERY     rt breast lumpectomy   HYSTEROSCOPY WITH D & C N/A 06/05/2021   Procedure: DILATATION AND CURETTAGE /HYSTEROSCOPY/MYOSURE RESECTION OF UTERINE FIBROIDS;  Surgeon: Lavonia Drafts, MD;  Location: [place];  Service: Gynecology;  Laterality: N/A;   LIPOSUCTION     TUBAL LIGATION      Medications  Current Outpatient Medications on File Prior to Visit  Medication Sig Dispense Refill   amlodipine-atorvastatin (CADUET) 2.5-40 MG tablet Take 1 tablet by mouth daily.     butalbital-acetaminophen-caffeine  (FIORICET) 50-325-40 MG tablet Take one po qd prn headache 15 tablet 3   diclofenac sodium (VOLTAREN) 1 % GEL Apply 2 g topically 4 (four) times daily as needed. 200 g 5   fluconazole (DIFLUCAN) 150 MG tablet Take 1 tablet (150 mg total) by mouth daily. 15 tablet 0   hydrOXYzine (VISTARIL) 25 MG capsule Take 1 capsule (25 mg total) by mouth 3 (three) times daily as needed. 60 capsule 5   levocetirizine (XYZAL) 5 MG tablet Take 1 tablet (5 mg total) by mouth every evening. 90 tablet 2   lidocaine (XYLOCAINE) 5 % ointment Apply 1 application. topically as needed. 35.44 g 5   montelukast (SINGULAIR) 10 MG tablet Take 1 tablet (10 mg total) by mouth at bedtime. 90 tablet 2   Multiple Vitamins-Minerals (MULTIVITAMIN ADULT PO)      ondansetron (ZOFRAN-ODT) 4 MG disintegrating tablet Take 1 tablet (4 mg total) by mouth every 6 (six) hours as needed for nausea. 10 tablet 0   oxyCODONE-acetaminophen (PERCOCET) 10-325 MG tablet Take 1 tablet by mouth every 6 (six) hours as needed for pain. 120 tablet 0   pantoprazole (PROTONIX) 40 MG tablet Take 1 tablet (40 mg total) by mouth daily. 30 tablet 3   tamoxifen (NOLVADEX) 20 MG tablet Take 20 mg by mouth daily.     triamterene-hydrochlorothiazide (MAXZIDE-25) 37.5-25 MG tablet Take 1/2 (one-half) tablet by mouth once daily 90 tablet 0   Vitamin D, Ergocalciferol, (DRISDOL) 1.25 MG (50000 UNIT) CAPS capsule Take 1  capsule by mouth once a week 12 capsule 0   Allergies Allergies  Allergen Reactions   Morphine And Related Nausea And Vomiting and Swelling   Penicillins    Sulfa Antibiotics     Review of Systems: Constitutional:  no fevers Eye:  no recent significant change in vision Ears:  No changes in hearing Nose/Mouth/Throat:  no complaints of nasal congestion, no sore throat Cardiovascular: no chest pain Respiratory:  No shortness of breath Gastrointestinal:  No change in bowel habits GU:  Female: negative for dysuria Integumentary:  no abnormal  skin lesions reported Neurologic:  no headaches Endocrine:  denies unexplained weight changes  Exam BP 132/84 (BP Location: Left Arm, Cuff Size: Large)   Pulse 100   Resp 18   Ht 5' 4.5" (1.638 m)   Wt 223 lb (101.2 kg)   SpO2 100%   BMI 37.69 kg/m  General:  well developed, well nourished, in no apparent distress Skin:  no significant moles, warts, or growths Head:  no masses, lesions, or tenderness Eyes:  pupils equal and round, sclera anicteric without injection Ears:  canals without lesions, TMs shiny without retraction, no obvious effusion, no erythema Nose:  nares patent, septum midline, mucosa normal, and no drainage or sinus tenderness Throat/Pharynx:  lips and gingiva without lesion; tongue and uvula midline; non-inflamed pharynx; no exudates or postnasal drainage Neck: neck supple without adenopathy, thyromegaly, or masses Lungs:  clear to auscultation, breath sounds equal bilaterally, no respiratory distress Cardio:  regular rate and rhythm, no bruits or LE edema Abdomen:  abdomen soft, nontender; bowel sounds normal; no masses or organomegaly Genital: Deferred Neuro:  gait normal; deep tendon reflexes normal and symmetric Psych: well oriented with normal range of affect and appropriate judgment/insight  Assessment and Plan  Well adult exam  Seasonal allergies - Plan: fluticasone (FLONASE) 50 MCG/ACT nasal spray, methylPREDNISolone acetate (DEPO-MEDROL) injection 80 mg  Hyperlipidemia associated with type 2 diabetes mellitus (HCC)  Prediabetes - Plan: Hemoglobin A1c  Vitamin D insufficiency - Plan: VITAMIN D 25 Hydroxy (Vit-D Deficiency, Fractures)  Hyperlipidemia, unspecified hyperlipidemia type - Plan: CBC, Comprehensive metabolic panel, Lipid panel   Well 66 y.o. female. Counseled on diet and exercise. Other orders as above. Advanced directive form provided today.  Shingrix rec'd. Hold on PCV20 as we are giving her a steroid IM today.  Allergies: Chronic  issue on chronic meds, exacerbation. Depo injection today, cont Xyzal 5 mg/d, Singulair 10 mg/d. Add INCS. F/u prn. Could consider referral back to allergy vs adding INAH.  Follow up in 6 mo or prn. The patient voiced understanding and agreement to the plan.  [place], DO 12/14/21 12:32 PM

## 2021-12-17 ENCOUNTER — Other Ambulatory Visit: Payer: Self-pay | Admitting: Family Medicine

## 2021-12-17 MED ORDER — AMLODIPINE-ATORVASTATIN 2.5-40 MG PO TABS: 1.0000 | ORAL_TABLET | Freq: Every day | ORAL | 2 refills | Status: DC

## 2021-12-19 ENCOUNTER — Other Ambulatory Visit: Payer: Self-pay | Admitting: Family Medicine

## 2021-12-20 MED ORDER — PREDNISONE 20 MG PO TABS: 40.0000 mg | ORAL_TABLET | Freq: Every day | ORAL | 0 refills | Status: AC

## 2021-12-20 NOTE — Telephone Encounter (Signed)
Please see the MyChart message reply(ies) for my assessment and plan.  The patient gave consent for this Medical Advice Message and is aware that it may result in a bill to their insurance company as well as the possibility that this may result in a co-payment or deductible. They are an established patient, but are not seeking medical advice exclusively about a problem treated during an in person or video visit in the last 7 days. I did not recommend an in person or video visit within 7 days of my reply.  I spent a total of 6 minutes cumulative time within 7 days through MyChart messaging Amilyah Nack Paul Shene [place], DO  

## 2022-01-02 ENCOUNTER — Ambulatory Visit (INDEPENDENT_AMBULATORY_CARE_PROVIDER_SITE_OTHER): Payer: Medicare HMO | Admitting: Neurology

## 2022-01-02 ENCOUNTER — Other Ambulatory Visit: Payer: Self-pay | Admitting: Family Medicine

## 2022-01-02 ENCOUNTER — Encounter: Payer: Self-pay | Admitting: Neurology

## 2022-01-02 VITALS — BP 178/100 | HR 74 | Ht 64.5 in | Wt 230.5 lb

## 2022-01-02 DIAGNOSIS — M7062 Trochanteric bursitis, left hip: Secondary | ICD-10-CM

## 2022-01-02 DIAGNOSIS — Z79891 Long term (current) use of opiate analgesic: Secondary | ICD-10-CM | POA: Diagnosis not present

## 2022-01-02 DIAGNOSIS — M545 Low back pain, unspecified: Secondary | ICD-10-CM | POA: Diagnosis not present

## 2022-01-02 DIAGNOSIS — M5432 Sciatica, left side: Secondary | ICD-10-CM | POA: Diagnosis not present

## 2022-01-02 DIAGNOSIS — M5431 Sciatica, right side: Secondary | ICD-10-CM

## 2022-01-02 DIAGNOSIS — C50919 Malignant neoplasm of unspecified site of unspecified female breast: Secondary | ICD-10-CM

## 2022-01-02 DIAGNOSIS — Z13820 Encounter for screening for osteoporosis: Secondary | ICD-10-CM

## 2022-01-02 MED ORDER — PREDNISONE 10 MG (21) PO TBPK: ORAL_TABLET | ORAL | 0 refills | Status: DC

## 2022-01-02 MED ORDER — FLUCONAZOLE 150 MG PO TABS: 150.0000 mg | ORAL_TABLET | Freq: Every day | ORAL | 0 refills | Status: DC

## 2022-01-02 MED ORDER — OXYCODONE-ACETAMINOPHEN 10-325 MG PO TABS: 1.0000 | ORAL_TABLET | Freq: Four times a day (QID) | ORAL | 0 refills | Status: DC | PRN

## 2022-01-02 NOTE — Progress Notes (Signed)
t  [facility]  PATIENT: Kimberly Mann DOB: 1955-09-21  REFERRING CLINICIAN: [name]  HISTORY FROM: Patient   REASON FOR VISIT: Left shoulder and neck pain; back pain   HISTORICAL  CHIEF COMPLAINT:  Chief Complaint  Patient presents with   Follow-up    RM 2, alone. Needs refill on diflucan. Requesting rx for prednisone. Having vertigo episodes d/t allergies.     HISTORY OF PRESENT ILLNESS:  Kimberly Mann is a 66 y.o. woman with neck, back, hip and shoulder pain.  Update 01/02/2022 She did well after her injections 4 months ago for about 3 months but pain is worsening again in back, hips .  The worst pain is in the hips, right > left  P pain worsens with standing or walking.   We have done trochanteric bursae injections several times at with benefit.  We also sometimes done trigger point in the piriformis muscles or the lower lumbar spinal muscles, usually with benefit for several months.  She also repots some right > left shoulder but pain is much better sill since subacromial bursa injections at the last visit.   She had a bone density test in 08-03-19 showing osteopenia.   She does get steroid injection from [place] 3 times a year (usually 80 mg Depo-Medrol) and also has had some steroid packs    the shots help her more than the steroid packs.  We discussed that we need to continue to try to limit the injections to just a couple each year.  She has migraine headaches twice a week. Imitrex has not worked in the past.  Fioricet has helped her some in the past but she has not taken for 3-4/year.  She has some depression but is doing better than earlier in the year.  Her mother died in 2018-08-03.  She has breast cancer,  She is done with RadRx and still is on tamoxifen.      MRI Imaging: MRI cervical spine 03/06/2017:  Shallow broad-based central protrusion at C4-5 slightly deforms the  cord but the central canal and foramina are open.    Mild right foraminal narrowing at  C5-6 due to uncovertebral  spurring. The central canal and left foramen are open at this level.    Based on comparison with the report of the prior MRI, the appearance of the cervical spine is unchanged.   MRI brain 10/06/2019 [facility]):  This MRI of the brain with and without contrast with added attention to the internal auditory canals shows the following:   The 8th nerves and the internal auditory canals appear normal. Scattered T2/FLAIR hyperintense foci in the subcortical and deep white matter consistent with chronic microvascular ischemic change.  None of the foci appear to be acute.  There is a normal enhancement pattern and no acute findings.    REVIEW OF SYSTEMS:  Constitutional: No fevers, chills, sweats, or change in appetite Eyes: No visual changes, double vision, eye pain Ear, nose and throat: No hearing loss, ear pain, nasal congestion, sore throat Cardiovascular: No chest pain, palpitations Respiratory:  No shortness of breath at rest or with exertion.   No wheezes GastrointestinaI: No nausea, vomiting, diarrhea, abdominal pain, fecal incontinence Genitourinary:  No dysuria, urinary retention or frequency.  No nocturia. Musculoskeletal: as above Integumentary: No rash, pruritus, skin lesions Neurological: as above Psychiatric: She has som depression since breast cancer dx.  Endocrine: No palpitations, diaphoresis, change in appetite, change in weigh or increased thirst  ALLERGIES: Allergies  Allergen  Reactions   Morphine And Related Nausea And Vomiting and Swelling   Penicillins    Sulfa Antibiotics     HOME MEDICATIONS: Outpatient Medications Prior to Visit  Medication Sig Dispense Refill   amlodipine-atorvastatin (CADUET) 2.5-40 MG tablet Take 1 tablet by mouth daily. 90 tablet 2   butalbital-acetaminophen-caffeine (FIORICET) 50-325-40 MG tablet Take one po qd prn headache 15 tablet 3   diclofenac sodium (VOLTAREN) 1 % GEL Apply 2 g topically 4 (four) times  daily as needed. 200 g 5   fluticasone (FLONASE) 50 MCG/ACT nasal spray Place 2 sprays into both nostrils daily. 16 g 3   hydrOXYzine (VISTARIL) 25 MG capsule Take 1 capsule (25 mg total) by mouth 3 (three) times daily as needed. 60 capsule 5   levocetirizine (XYZAL) 5 MG tablet Take 1 tablet (5 mg total) by mouth every evening. 90 tablet 2   lidocaine (XYLOCAINE) 5 % ointment Apply 1 application. topically as needed. 35.44 g 5   montelukast (SINGULAIR) 10 MG tablet Take 1 tablet (10 mg total) by mouth at bedtime. 90 tablet 2   Multiple Vitamins-Minerals (MULTIVITAMIN ADULT PO)      ondansetron (ZOFRAN-ODT) 4 MG disintegrating tablet Take 1 tablet (4 mg total) by mouth every 6 (six) hours as needed for nausea. 10 tablet 0   pantoprazole (PROTONIX) 40 MG tablet Take 1 tablet (40 mg total) by mouth daily. 30 tablet 3   tamoxifen (NOLVADEX) 20 MG tablet Take 20 mg by mouth daily.     triamterene-hydrochlorothiazide (MAXZIDE-25) 37.5-25 MG tablet Take 1/2 (one-half) tablet by mouth once daily 90 tablet 0   Vitamin D, Ergocalciferol, (DRISDOL) 1.25 MG (50000 UNIT) CAPS capsule Take 1 capsule by mouth once a week 12 capsule 0   fluconazole (DIFLUCAN) 150 MG tablet Take 1 tablet (150 mg total) by mouth daily. 15 tablet 0   oxyCODONE-acetaminophen (PERCOCET) 10-325 MG tablet Take 1 tablet by mouth every 6 (six) hours as needed for pain. 120 tablet 0   No facility-administered medications prior to visit.    PAST MEDICAL HISTORY: Past Medical History:  Diagnosis Date   Anxiety    Cancer ([place])    breast   Chronic back pain    takes oxycodone   Complication of anesthesia    Eczema    Environmental allergies    Headache    Hypertension    PONV (postoperative nausea and vomiting)    Vertigo     PAST SURGICAL HISTORY: Past Surgical History:  Procedure Laterality Date   BREAST REDUCTION SURGERY Left 11/20/2017   Procedure: left breast reduction for symmetry and liposuction bilateral lateral  breasts;  Surgeon: Wallace Going, DO;  Location: [place];  Service: Plastics;  Laterality: Left;   BREAST SURGERY     rt breast lumpectomy   HYSTEROSCOPY WITH D & C N/A 06/05/2021   Procedure: DILATATION AND CURETTAGE /HYSTEROSCOPY/MYOSURE RESECTION OF UTERINE FIBROIDS;  Surgeon: Lavonia Drafts, MD;  Location: [place];  Service: Gynecology;  Laterality: N/A;   LIPOSUCTION     TUBAL LIGATION      FAMILY HISTORY: Family History  Problem Relation Age of Onset   Hypertension Mother     SOCIAL HISTORY:  Social History   Socioeconomic History   Marital status: Legally Separated    Spouse name: Not on file   Number of children: Not on file   Years of education: Not on file   Highest education level: Not on file  Occupational History  Not on file  Tobacco Use   Smoking status: Former    Types: Cigarettes    Quit date: 05/17/1974    Years since quitting: 47.6   Smokeless tobacco: Never  Vaping Use   Vaping Use: Never used  Substance and Sexual Activity   Alcohol use: Not Currently    Alcohol/week: 7.0 standard drinks of alcohol    Types: 7 Standard drinks or equivalent per week   Drug use: No    Types: Marijuana    Comment: Last use 06/04/21   Sexual activity: Not on file  Other Topics Concern   Not on file  Social History Narrative   Not on file   Social Determinants of Health   Financial Resource Strain: Low Risk  (02/01/2021)   Overall Financial Resource Strain (CARDIA)    Difficulty of Paying Living Expenses: Not hard at all  Food Insecurity: No Food Insecurity (02/01/2021)   Hunger Vital Sign    Worried About Running Out of Food in the Last Year: Never true    Ran Out of Food in the Last Year: Never true  Transportation Needs: No Transportation Needs (02/01/2021)   PRAPARE - Hydrologist (Medical): No    Lack of Transportation (Non-Medical): No  Physical Activity: Inactive (02/01/2021)    Exercise Vital Sign    Days of Exercise per Week: 0 days    Minutes of Exercise per Session: 0 min  Stress: No Stress Concern Present (02/01/2021)   [place]    Feeling of Stress : Not at all  Social Connections: Moderately Isolated (02/01/2021)   Social Connection and Isolation Panel [NHANES]    Frequency of Communication with Friends and Family: More than three times a week    Frequency of Social Gatherings with Friends and Family: More than three times a week    Attends Religious Services: More than 4 times per year    Active Member of [employer] or Organizations: No    Attends Archivist Meetings: Never    Marital Status: Separated  Intimate Partner Violence: Not At Risk (02/01/2021)   Humiliation, Afraid, Rape, and Kick questionnaire    Fear of Current or Ex-Partner: No    Emotionally Abused: No    Physically Abused: No    Sexually Abused: No     PHYSICAL EXAM  Vitals:   01/02/22 1109  BP: (!) 178/100  Pulse: 74  Weight: 230 lb 8 oz (104.6 kg)  Height: 5' 4.5" (1.638 m)    Body mass index is 38.95 kg/m.   General: The patient is well-developed and well-nourished and in no acute distress   Musculoskeletal:   She has very mild tenderness over the subacromial bursae bilaterally with slightly reduced range of motion in the shoulders.  lower lumbar paraspinal muscles more than the piriformis muscles and moderate tenderness over the bilateral trochanteric bursae.  Neurologic Exam  Mental status: The patient is alert and oriented x 3 at the time of the examination. The patient has apparent normal recent and remote memory, with an apparently normal attention span and concentration ability.    Cranial nerves:    Facial strength is normal.  Trapezius strength is normal.  No dysarthria is noted.    No obvious hearing deficits are noted.  Motor:  Muscle bulk and tone are normal. Strength is 5/5 in the arms  or legs.   Gait and station: Station is normal.  She has  an arthritic gait.  Tandem gait is mildly wide..  Romberg is negative.  DTRs:   Reflexes are normal and symmetric in the arms and legs.     ASSESSMENT AND PLAN   1. Chronic prescription opiate use   2. Back pain, lumbosacral   3. Trochanteric bursitis, left hip   4. Malignant neoplasm of female breast, unspecified estrogen receptor status, unspecified laterality, unspecified site of breast ([place])   5. Bilateral sciatica       1.   Injection of bilaterals trochanteric bursae with 40 mg Depo-Medrol in 4cc lidocaine using sterile technique..  She tolerated the injections well and pain was better afterwards. 2.   Trigger point injection of bilateral L4 and L5 muscles with 40 mg Depo-Medrol in 4 cc lidocaine continue  3.   Steroid pack for vertigo 4.   Continue Percocet will be renewed.  [place] controlled substance database was reviewed and she is compliant..  A postdated prescription has been sent to the pharmacy.  We will check a urine drug screen today. 5.     She has osteopenia on 2021 DEXA - may need f/u.  We have been averaging 1 steroid injection every 4 months and she reports she may get steroid 1 or 2 other times a year.  I discussed that she should talk to her primary care about her osteopenia to see if she might benefit from treatment. 6.   Stay active and exercise as tolerated. 7.   She will return to see me in 4 months, sooner if she  has new or worsening neurologic symptoms.    Deklyn Trachtenberg A. Felecia Shelling, MD, PhD 08/03/6544, 5:03 PM Certified in Neurology, Clinical Neurophysiology, Sleep Medicine, Pain Medicine and Neuroimaging  [facility] [address] [place], [place] [address] [phone]

## 2022-01-09 ENCOUNTER — Telehealth (HOSPITAL_BASED_OUTPATIENT_CLINIC_OR_DEPARTMENT_OTHER): Payer: Self-pay

## 2022-01-16 ENCOUNTER — Other Ambulatory Visit: Payer: Self-pay | Admitting: Family Medicine

## 2022-01-16 MED ORDER — AMLODIPINE BESYLATE 2.5 MG PO TABS: 2.5000 mg | ORAL_TABLET | Freq: Every day | ORAL | 2 refills | Status: DC

## 2022-01-16 MED ORDER — ROSUVASTATIN CALCIUM 5 MG PO TABS: 5.0000 mg | ORAL_TABLET | Freq: Every day | ORAL | 3 refills | Status: DC

## 2022-01-17 ENCOUNTER — Other Ambulatory Visit: Payer: Self-pay | Admitting: Family Medicine

## 2022-01-31 ENCOUNTER — Other Ambulatory Visit (HOSPITAL_BASED_OUTPATIENT_CLINIC_OR_DEPARTMENT_OTHER): Payer: Medicare HMO

## 2022-02-03 DIAGNOSIS — Z6837 Body mass index (BMI) 37.0-37.9, adult: Secondary | ICD-10-CM | POA: Diagnosis not present

## 2022-02-03 DIAGNOSIS — N390 Urinary tract infection, site not specified: Secondary | ICD-10-CM | POA: Diagnosis not present

## 2022-02-03 DIAGNOSIS — R350 Frequency of micturition: Secondary | ICD-10-CM | POA: Diagnosis not present

## 2022-02-03 DIAGNOSIS — R42 Dizziness and giddiness: Secondary | ICD-10-CM | POA: Diagnosis not present

## 2022-02-03 DIAGNOSIS — I10 Essential (primary) hypertension: Secondary | ICD-10-CM | POA: Diagnosis not present

## 2022-02-07 ENCOUNTER — Ambulatory Visit (HOSPITAL_BASED_OUTPATIENT_CLINIC_OR_DEPARTMENT_OTHER)
Admission: RE | Admit: 2022-02-07 | Discharge: 2022-02-07 | Disposition: A | Discharge status: Home / Self Care | Payer: Medicare HMO | Source: Ambulatory Visit | Attending: Family Medicine | Admitting: Family Medicine

## 2022-02-07 ENCOUNTER — Other Ambulatory Visit: Payer: Self-pay | Admitting: Neurology

## 2022-02-07 DIAGNOSIS — M545 Low back pain, unspecified: Secondary | ICD-10-CM

## 2022-02-07 DIAGNOSIS — Z13820 Encounter for screening for osteoporosis: Secondary | ICD-10-CM | POA: Insufficient documentation

## 2022-02-07 DIAGNOSIS — M85851 Other specified disorders of bone density and structure, right thigh: Secondary | ICD-10-CM | POA: Insufficient documentation

## 2022-02-07 DIAGNOSIS — Z780 Asymptomatic menopausal state: Secondary | ICD-10-CM | POA: Insufficient documentation

## 2022-02-07 MED ORDER — OXYCODONE-ACETAMINOPHEN 10-325 MG PO TABS: 1.0000 | ORAL_TABLET | Freq: Four times a day (QID) | ORAL | 0 refills | Status: DC | PRN

## 2022-02-07 NOTE — Telephone Encounter (Signed)
Pt last appt was on 9/6 and has an up coming appt on 05/23/22. Pt last refill on the registry was on 9/12.

## 2022-02-07 NOTE — Telephone Encounter (Signed)
Pt request refill for oxyCODONE-acetaminophen (PERCOCET) 10-325 MG tablet at [facility]

## 2022-02-25 ENCOUNTER — Telehealth: Payer: Self-pay | Admitting: Family Medicine

## 2022-02-25 NOTE — Telephone Encounter (Signed)
Copied from [place] [id number]. Topic: Appointment Scheduling - Scheduling Inquiry for Clinic >> Feb 25, 2022 10:10 AM Gillis Santa wrote: Reason for CRM: LVM FOR PATIENT TO CALL [phone] TO SCHEDULE AWVS WITH HEALTH COACH

## 2022-03-04 ENCOUNTER — Telehealth: Payer: Self-pay

## 2022-03-04 ENCOUNTER — Telehealth: Payer: Self-pay | Admitting: Neurology

## 2022-03-04 ENCOUNTER — Other Ambulatory Visit: Payer: Self-pay | Admitting: Neurology

## 2022-03-04 DIAGNOSIS — M545 Low back pain, unspecified: Secondary | ICD-10-CM

## 2022-03-04 MED ORDER — OXYCODONE-ACETAMINOPHEN 10-325 MG PO TABS: 1.0000 | ORAL_TABLET | Freq: Four times a day (QID) | ORAL | 0 refills | Status: DC | PRN

## 2022-03-04 NOTE — Telephone Encounter (Signed)
NA

## 2022-03-04 NOTE — Telephone Encounter (Signed)
Pt is requesting a refill for oxyCODONE-acetaminophen (PERCOCET) 10-325 MG tablet .  Pharmacy: [place] [phone]

## 2022-03-11 ENCOUNTER — Telehealth: Payer: Self-pay | Admitting: Family Medicine

## 2022-03-11 NOTE — Telephone Encounter (Signed)
Copied from [place] [id number]. Topic: Medicare AWV >> Mar 11, 2022  8:52 AM Gillis Santa wrote: Reason for CRM: LVM FOR PATIENT TO CALL [phone] TO SCHEDULE AWVS [place]

## 2022-04-03 ENCOUNTER — Other Ambulatory Visit: Payer: Self-pay | Admitting: Neurology

## 2022-04-03 DIAGNOSIS — Z5181 Encounter for therapeutic drug level monitoring: Secondary | ICD-10-CM | POA: Diagnosis not present

## 2022-04-03 DIAGNOSIS — Z170 Estrogen receptor positive status [ER+]: Secondary | ICD-10-CM | POA: Diagnosis not present

## 2022-04-03 DIAGNOSIS — Z79810 Long term (current) use of selective estrogen receptor modulators (SERMs): Secondary | ICD-10-CM | POA: Diagnosis not present

## 2022-04-03 DIAGNOSIS — M545 Low back pain, unspecified: Secondary | ICD-10-CM

## 2022-04-03 DIAGNOSIS — Z853 Personal history of malignant neoplasm of breast: Secondary | ICD-10-CM | POA: Diagnosis not present

## 2022-04-03 DIAGNOSIS — C50511 Malignant neoplasm of lower-outer quadrant of right female breast: Secondary | ICD-10-CM | POA: Diagnosis not present

## 2022-04-03 DIAGNOSIS — D0511 Intraductal carcinoma in situ of right breast: Secondary | ICD-10-CM | POA: Diagnosis not present

## 2022-04-03 MED ORDER — OXYCODONE-ACETAMINOPHEN 10-325 MG PO TABS: 1.0000 | ORAL_TABLET | Freq: Four times a day (QID) | ORAL | 0 refills | Status: DC | PRN

## 2022-04-03 NOTE — Telephone Encounter (Signed)
Pt is calling. Requesting a refill on medication   oxyCODONE-acetaminophen (PERCOCET) 10-325 MG tablet . Refill should be sent to [facility]

## 2022-04-03 NOTE — Telephone Encounter (Signed)
Last seen 01/02/22 and next f/u 05/23/22. Per drug registry, last refilled 03/09/22 #120.

## 2022-04-08 ENCOUNTER — Emergency Department (HOSPITAL_BASED_OUTPATIENT_CLINIC_OR_DEPARTMENT_OTHER)
Admission: EM | Admit: 2022-04-08 | Discharge: 2022-04-08 | Discharge status: Against Medical Advice | Payer: Medicare HMO | Attending: Emergency Medicine | Admitting: Emergency Medicine

## 2022-04-08 ENCOUNTER — Other Ambulatory Visit: Payer: Self-pay

## 2022-04-08 ENCOUNTER — Encounter (HOSPITAL_BASED_OUTPATIENT_CLINIC_OR_DEPARTMENT_OTHER): Payer: Self-pay | Admitting: Pediatrics

## 2022-04-08 DIAGNOSIS — Z5321 Procedure and treatment not carried out due to patient leaving prior to being seen by health care provider: Secondary | ICD-10-CM | POA: Diagnosis not present

## 2022-04-08 DIAGNOSIS — T7840XA Allergy, unspecified, initial encounter: Secondary | ICD-10-CM | POA: Insufficient documentation

## 2022-04-08 NOTE — ED Notes (Signed)
Called for patient in lobby, no answer

## 2022-04-08 NOTE — ED Triage Notes (Addendum)
Reported consumed a variety of seafood yesterday and concern for new allergic reaction due to itching on left side of face along with swelling and redness, concern for  rashes on knuckles and upper neck / chest area. Reported taken some vistaril.

## 2022-04-08 NOTE — ED Notes (Signed)
Pt not in room, pt called in lobby x2. No answer.

## 2022-04-29 ENCOUNTER — Other Ambulatory Visit: Payer: Self-pay | Admitting: Family Medicine

## 2022-05-08 ENCOUNTER — Other Ambulatory Visit: Payer: Self-pay | Admitting: Neurology

## 2022-05-08 DIAGNOSIS — M545 Low back pain, unspecified: Secondary | ICD-10-CM

## 2022-05-08 MED ORDER — OXYCODONE-ACETAMINOPHEN 10-325 MG PO TABS: 1.0000 | ORAL_TABLET | Freq: Four times a day (QID) | ORAL | 0 refills | Status: DC | PRN

## 2022-05-08 NOTE — Telephone Encounter (Signed)
I called to speak with patient about the refill request. Patient reports she needs refill on oxycodone-acetaminophen 10-325 mg tablet, not the below. Rx was last filled on 04/03/22 with note attached to pharmacy to refill on or after 04/06/22.

## 2022-05-08 NOTE — Telephone Encounter (Signed)
Pt is calling. Requesting a refill on medication triamterene-hydrochlorothiazide (MAXZIDE-25) 37.5-25 MG tablet. Should be sent to  [facility]

## 2022-05-23 ENCOUNTER — Other Ambulatory Visit: Payer: Self-pay | Admitting: Neurology

## 2022-05-23 ENCOUNTER — Ambulatory Visit (INDEPENDENT_AMBULATORY_CARE_PROVIDER_SITE_OTHER): Payer: Medicare HMO | Admitting: Neurology

## 2022-05-23 ENCOUNTER — Encounter: Payer: Self-pay | Admitting: Neurology

## 2022-05-23 VITALS — BP 171/112 | HR 86 | Ht 65.0 in | Wt 225.0 lb

## 2022-05-23 DIAGNOSIS — M7552 Bursitis of left shoulder: Secondary | ICD-10-CM

## 2022-05-23 DIAGNOSIS — M545 Low back pain, unspecified: Secondary | ICD-10-CM

## 2022-05-23 DIAGNOSIS — Z79891 Long term (current) use of opiate analgesic: Secondary | ICD-10-CM | POA: Diagnosis not present

## 2022-05-23 DIAGNOSIS — M7551 Bursitis of right shoulder: Secondary | ICD-10-CM

## 2022-05-23 DIAGNOSIS — M7062 Trochanteric bursitis, left hip: Secondary | ICD-10-CM | POA: Diagnosis not present

## 2022-05-23 MED ORDER — FLUCONAZOLE 150 MG PO TABS: 150.0000 mg | ORAL_TABLET | Freq: Every day | ORAL | 0 refills | Status: DC

## 2022-05-23 MED ORDER — PREDNISONE 10 MG (21) PO TBPK: ORAL_TABLET | ORAL | 0 refills | Status: DC

## 2022-05-23 NOTE — Progress Notes (Signed)
t  [facility]  PATIENT: Kimberly Mann DOB: February 05, 1956  REFERRING CLINICIAN: [name]  HISTORY FROM: Patient   REASON FOR VISIT: Left shoulder and neck pain; back pain   HISTORICAL  CHIEF COMPLAINT:  Chief Complaint  Patient presents with   Follow-up    RM 11, alone. Last seen 01/02/22. Requesting rx called in for diflucan d/t shot she will get today.     HISTORY OF PRESENT ILLNESS:  Kimberly Mann is a 67 y.o. woman with neck, back, hip and shoulder pain.  Update 05/23/2022 She continues to experience a lot of shoulder pain and, neck pain.   Her hip pain and LBP have dne better since trochanteric bursa injection and TPI last visit.    Shoulders were injected about 8 months ago and she had some improvement > 6 months  She did well after her injections 4 months ago for about 3 months but pain is worsening again in back, hips .  The worst pain is in the hips, right > left  P pain worsens with standing or walking.   We have done trochanteric bursae injections several times at with benefit.  We also sometimes done trigger point in the piriformis muscles or the lower lumbar spinal muscles, usually with benefit for several months.  She also repots some right > left shoulder but pain is much better sill since subacromial bursa injections at the last visit.   She had a bone density test in 08/04/19 showing osteopenia.   She does get steroid injection from [place] 3 times a year (usually 80 mg Depo-Medrol, sometimes just 40 mg) and also has had some steroid packs    the shots help her more than the steroid packs.  We discussed that we need to continue to try to limit the injections to just a few each year.  She has migraine headaches twice a week. Imitrex has not worked in the past.  Fioricet has helped her some in the past but she has not taken for 3-4/year.  She has some depression but is doing better than earlier in the year.  Her mother died in 04-Aug-2018.  She has breast  cancer,  She is done with [facility]Rx and still is on tamoxifen.      MRI Imaging: MRI cervical spine 03/06/2017:  Shallow broad-based central protrusion at C4-5 slightly deforms the  cord but the central canal and foramina are open.    Mild right foraminal narrowing at C5-6 due to uncovertebral  spurring. The central canal and left foramen are open at this level.    Based on comparison with the report of the prior MRI, the appearance of the cervical spine is unchanged.   MRI brain 10/06/2019 [facility]):  This MRI of the brain with and without contrast with added attention to the internal auditory canals shows the following:   The 8th nerves and the internal auditory canals appear normal. Scattered T2/FLAIR hyperintense foci in the subcortical and deep white matter consistent with chronic microvascular ischemic change.  None of the foci appear to be acute.  There is a normal enhancement pattern and no acute findings.    REVIEW OF SYSTEMS:  Constitutional: No fevers, chills, sweats, or change in appetite Eyes: No visual changes, double vision, eye pain Ear, nose and throat: No hearing loss, ear pain, nasal congestion, sore throat Cardiovascular: No chest pain, palpitations Respiratory:  No shortness of breath at rest or with exertion.   No wheezes GastrointestinaI: No nausea, vomiting, diarrhea,  abdominal pain, fecal incontinence Genitourinary:  No dysuria, urinary retention or frequency.  No nocturia. Musculoskeletal: as above Integumentary: No rash, pruritus, skin lesions Neurological: as above Psychiatric: She has som depression since breast cancer dx.  Endocrine: No palpitations, diaphoresis, change in appetite, change in weigh or increased thirst  ALLERGIES: Allergies  Allergen Reactions   Morphine And Related Nausea And Vomiting and Swelling   Penicillins    Sulfa Antibiotics     HOME MEDICATIONS: Outpatient Medications Prior to Visit  Medication Sig Dispense Refill    amLODipine (NORVASC) 2.5 MG tablet Take 1 tablet (2.5 mg total) by mouth daily. 90 tablet 2   butalbital-acetaminophen-caffeine (FIORICET) 50-325-40 MG tablet Take one po qd prn headache 15 tablet 3   diclofenac sodium (VOLTAREN) 1 % GEL Apply 2 g topically 4 (four) times daily as needed. 200 g 5   fluconazole (DIFLUCAN) 150 MG tablet Take 1 tablet (150 mg total) by mouth daily. 15 tablet 0   fluticasone (FLONASE) 50 MCG/ACT nasal spray Place 2 sprays into both nostrils daily. 16 g 3   hydrOXYzine (VISTARIL) 25 MG capsule Take 1 capsule (25 mg total) by mouth 3 (three) times daily as needed. 60 capsule 5   levocetirizine (XYZAL) 5 MG tablet Take 1 tablet (5 mg total) by mouth every evening. 90 tablet 2   lidocaine (XYLOCAINE) 5 % ointment Apply 1 application. topically as needed. 35.44 g 5   montelukast (SINGULAIR) 10 MG tablet Take 1 tablet (10 mg total) by mouth at bedtime. 90 tablet 2   Multiple Vitamins-Minerals (MULTIVITAMIN ADULT PO)      ondansetron (ZOFRAN-ODT) 4 MG disintegrating tablet Take 1 tablet (4 mg total) by mouth every 6 (six) hours as needed for nausea. 10 tablet 0   oxyCODONE-acetaminophen (PERCOCET) 10-325 MG tablet Take 1 tablet by mouth every 6 (six) hours as needed for pain. 120 tablet 0   pantoprazole (PROTONIX) 40 MG tablet Take 1 tablet (40 mg total) by mouth daily. 30 tablet 3   predniSONE (STERAPRED UNI-PAK 21 TAB) 10 MG (21) TBPK tablet 21 pill pack.  Taper from 6 pills to one pill daila as directed 1 each 0   rosuvastatin (CRESTOR) 5 MG tablet Take 1 tablet (5 mg total) by mouth daily. 30 tablet 3   tamoxifen (NOLVADEX) 20 MG tablet Take 20 mg by mouth daily.     triamterene-hydrochlorothiazide (MAXZIDE-25) 37.5-25 MG tablet Take 1/2 (one-half) tablet by mouth once daily 90 tablet 0   Vitamin D, Ergocalciferol, (DRISDOL) 1.25 MG (50000 UNIT) CAPS capsule Take 1 capsule by mouth once a week 12 capsule 0   No facility-administered medications prior to visit.    PAST  MEDICAL HISTORY: Past Medical History:  Diagnosis Date   Anxiety    Cancer ([place])    breast   Chronic back pain    takes oxycodone   Complication of anesthesia    Eczema    Environmental allergies    Headache    Hypertension    PONV (postoperative nausea and vomiting)    Vertigo     PAST SURGICAL HISTORY: Past Surgical History:  Procedure Laterality Date   BREAST REDUCTION SURGERY Left 11/20/2017   Procedure: left breast reduction for symmetry and liposuction bilateral lateral breasts;  Surgeon: Wallace Going, DO;  Location: [place];  Service: Plastics;  Laterality: Left;   BREAST SURGERY     rt breast lumpectomy   HYSTEROSCOPY WITH D & C N/A 06/05/2021   Procedure: DILATATION  AND CURETTAGE /HYSTEROSCOPY/MYOSURE RESECTION OF UTERINE FIBROIDS;  Surgeon: Lavonia Drafts, MD;  Location: [place];  Service: Gynecology;  Laterality: N/A;   LIPOSUCTION     TUBAL LIGATION      FAMILY HISTORY: Family History  Problem Relation Age of Onset   Hypertension Mother     SOCIAL HISTORY:  Social History   Socioeconomic History   Marital status: Legally Separated    Spouse name: Not on file   Number of children: Not on file   Years of education: Not on file   Highest education level: Not on file  Occupational History   Not on file  Tobacco Use   Smoking status: Former    Types: Cigarettes    Quit date: 05/17/1974    Years since quitting: 48.0   Smokeless tobacco: Never  Vaping Use   Vaping Use: Never used  Substance and Sexual Activity   Alcohol use: Not Currently    Alcohol/week: 7.0 standard drinks of alcohol    Types: 7 Standard drinks or equivalent per week   Drug use: No    Types: Marijuana    Comment: Last use 06/04/21   Sexual activity: Not on file  Other Topics Concern   Not on file  Social History Narrative   Not on file   Social Determinants of Health   Financial Resource Strain: Low Risk  (02/01/2021)    Overall Financial Resource Strain (CARDIA)    Difficulty of Paying Living Expenses: Not hard at all  Food Insecurity: No Food Insecurity (02/01/2021)   Hunger Vital Sign    Worried About Running Out of Food in the Last Year: Never true    Ran Out of Food in the Last Year: Never true  Transportation Needs: No Transportation Needs (02/01/2021)   PRAPARE - Hydrologist (Medical): No    Lack of Transportation (Non-Medical): No  Physical Activity: Inactive (02/01/2021)   Exercise Vital Sign    Days of Exercise per Week: 0 days    Minutes of Exercise per Session: 0 min  Stress: No Stress Concern Present (02/01/2021)   [place]    Feeling of Stress : Not at all  Social Connections: Moderately Isolated (02/01/2021)   Social Connection and Isolation Panel [NHANES]    Frequency of Communication with Friends and Family: More than three times a week    Frequency of Social Gatherings with Friends and Family: More than three times a week    Attends Religious Services: More than 4 times per year    Active Member of [employer] or Organizations: No    Attends Archivist Meetings: Never    Marital Status: Separated  Intimate Partner Violence: Not At Risk (02/01/2021)   Humiliation, Afraid, Rape, and Kick questionnaire    Fear of Current or Ex-Partner: No    Emotionally Abused: No    Physically Abused: No    Sexually Abused: No     PHYSICAL EXAM  Vitals:   05/23/22 1100 05/23/22 1104  BP: (!) 175/121 (!) 171/112  Pulse: 92 86  Weight: 225 lb (102.1 kg)   Height: '5\' 5"'$  (1.651 m)     Body mass index is 37.44 kg/m.   General: The patient is well-developed and well-nourished and in no acute distress   Musculoskeletal:   She has very mild tenderness over the subacromial bursae R >>L  with reduced range of motion in the right shoulder.  Also has milder lower lumbar paraspinal muscles tenderness and  over the bilateral trochanteric bursae.  Neurologic Exam  Mental status: The patient is alert and oriented x 3 at the time of the examination. The patient has apparent normal recent and remote memory, with an apparently normal attention span and concentration ability.    Cranial nerves:    Facial strength is normal.  Trapezius strength is normal.  No dysarthria is noted.    No obvious hearing deficits are noted.  Motor:  Muscle bulk and tone are normal. Strength is 5/5 in the arms or legs.   Gait and station: Station is normal.  She has an arthritic gait.  Tandem gait is mildly wide..  Romberg is negative.  DTRs:   Reflexes are normal and symmetric in the arms and legs.     ASSESSMENT AND PLAN   1. Subacromial bursitis of both shoulders   2. Chronic prescription opiate use   3. Trochanteric bursitis, left hip   4. Back pain, lumbosacral     1.   Injection of right subacromial bursa with 40 mg Depo-Medrol in 3 cc lidocaine using sterile technique..  She tolerated the injection well and pain was better afterwards. 2.     Continue Percocet will be renewed.  [place] controlled substance database was reviewed and she is compliant..  A postdated prescription has been sent to the pharmacy.  We will check a urine drug screen today. 3.       She has osteopenia on 2021 DEXA - may need f/u.  We have been averaging 1 steroid injection every 4 months and she reports she may get steroid 1 or 2 other times a year.  I discussed that she should talk to her primary care about her osteopenia to see if she might benefit from treatment. 4.   Stay active and exercise as tolerated. 5.   She will return to see me in 4 months, sooner if she  has new or worsening neurologic symptoms.    Rennae Ferraiolo A. Felecia Shelling, MD, PhD 2/87/6811, 57:26 AM Certified in Neurology, Clinical Neurophysiology, Sleep Medicine, Pain Medicine and Neuroimaging  [facility] [address] [place], [place]  [address] [phone]

## 2022-05-25 LAB — DRUG SCREEN, URINE
Amphetamines, Urine: NEGATIVE ng/mL
Barbiturate screen, urine: NEGATIVE ng/mL
Benzodiazepine Quant, Ur: NEGATIVE ng/mL
Cannabinoid Quant, Ur: NEGATIVE ng/mL
Cocaine (Metab.): NEGATIVE ng/mL
Opiate Quant, Ur: NEGATIVE ng/mL
PCP Quant, Ur: NEGATIVE ng/mL

## 2022-06-04 DIAGNOSIS — H40033 Anatomical narrow angle, bilateral: Secondary | ICD-10-CM | POA: Diagnosis not present

## 2022-06-06 ENCOUNTER — Other Ambulatory Visit: Payer: Self-pay | Admitting: Family Medicine

## 2022-06-06 DIAGNOSIS — J302 Other seasonal allergic rhinitis: Secondary | ICD-10-CM

## 2022-06-07 DIAGNOSIS — H52203 Unspecified astigmatism, bilateral: Secondary | ICD-10-CM | POA: Diagnosis not present

## 2022-06-07 DIAGNOSIS — H5203 Hypermetropia, bilateral: Secondary | ICD-10-CM | POA: Diagnosis not present

## 2022-06-07 DIAGNOSIS — H524 Presbyopia: Secondary | ICD-10-CM | POA: Diagnosis not present

## 2022-06-11 ENCOUNTER — Other Ambulatory Visit: Payer: Self-pay | Admitting: Neurology

## 2022-06-11 DIAGNOSIS — M545 Low back pain, unspecified: Secondary | ICD-10-CM

## 2022-06-11 MED ORDER — OXYCODONE-ACETAMINOPHEN 10-325 MG PO TABS: 1.0000 | ORAL_TABLET | Freq: Four times a day (QID) | ORAL | 0 refills | Status: DC | PRN

## 2022-06-11 NOTE — Telephone Encounter (Signed)
Pt is needing a refill on her oxyCODONE-acetaminophen (PERCOCET) 10-325 MG tablet sent to the [facility] on [address]

## 2022-06-11 NOTE — Telephone Encounter (Signed)
Pt last seen 05/23/22 and next f/u 09/19/22. Per drug registry, last refilled 05/08/22 #[id number].

## 2022-06-18 ENCOUNTER — Ambulatory Visit (INDEPENDENT_AMBULATORY_CARE_PROVIDER_SITE_OTHER): Payer: Medicare HMO | Admitting: Family Medicine

## 2022-06-18 ENCOUNTER — Encounter: Payer: Self-pay | Admitting: Family Medicine

## 2022-06-18 VITALS — BP 134/92 | HR 94 | Temp 98.2°F | Ht 65.0 in | Wt 226.1 lb

## 2022-06-18 DIAGNOSIS — R7303 Prediabetes: Secondary | ICD-10-CM | POA: Diagnosis not present

## 2022-06-18 DIAGNOSIS — M7501 Adhesive capsulitis of right shoulder: Secondary | ICD-10-CM | POA: Diagnosis not present

## 2022-06-18 DIAGNOSIS — E559 Vitamin D deficiency, unspecified: Secondary | ICD-10-CM | POA: Diagnosis not present

## 2022-06-18 DIAGNOSIS — E785 Hyperlipidemia, unspecified: Secondary | ICD-10-CM

## 2022-06-18 DIAGNOSIS — I10 Essential (primary) hypertension: Secondary | ICD-10-CM | POA: Diagnosis not present

## 2022-06-18 DIAGNOSIS — Z9109 Other allergy status, other than to drugs and biological substances: Secondary | ICD-10-CM

## 2022-06-18 DIAGNOSIS — M19019 Primary osteoarthritis, unspecified shoulder: Secondary | ICD-10-CM | POA: Insufficient documentation

## 2022-06-18 LAB — VITAMIN D 25 HYDROXY (VIT D DEFICIENCY, FRACTURES): VITD: 40.88 ng/mL (ref 30.00–100.00)

## 2022-06-18 LAB — COMPREHENSIVE METABOLIC PANEL
ALT: 17 U/L (ref 0–35)
AST: 15 U/L (ref 0–37)
Albumin: 4.1 g/dL (ref 3.5–5.2)
Alkaline Phosphatase: 69 U/L (ref 39–117)
BUN: 13 mg/dL (ref 6–23)
CO2: 27 mEq/L (ref 19–32)
Calcium: 9.2 mg/dL (ref 8.4–10.5)
Chloride: 106 mEq/L (ref 96–112)
Creatinine, Ser: 0.74 mg/dL (ref 0.40–1.20)
GFR: 83.41 mL/min (ref >60.00)
Glucose, Bld: 88 mg/dL (ref 70–99)
Potassium: 3.6 mEq/L (ref 3.5–5.1)
Sodium: 142 mEq/L (ref 135–145)
Total Bilirubin: 0.4 mg/dL (ref 0.2–1.2)
Total Protein: 7.2 g/dL (ref 6.0–8.3)

## 2022-06-18 LAB — LIPID PANEL
Cholesterol: 173 mg/dL (ref 0–200)
HDL: 57.7 mg/dL (ref >39.00)
LDL Cholesterol: 97 mg/dL (ref 0–99)
NonHDL: 115.77
Total CHOL/HDL Ratio: 3
Triglycerides: 95 mg/dL (ref 0.0–149.0)
VLDL: 19 mg/dL (ref 0.0–40.0)

## 2022-06-18 LAB — HEMOGLOBIN A1C: Hgb A1c MFr Bld: 5.7 % (ref 4.6–6.5)

## 2022-06-18 MED ORDER — TRIAMCINOLONE ACETONIDE 0.1 % EX CREA: 1.0000 | TOPICAL_CREAM | Freq: Two times a day (BID) | CUTANEOUS | 0 refills | Status: DC

## 2022-06-18 MED ORDER — AMLODIPINE-ATORVASTATIN 5-40 MG PO TABS: 1.0000 | ORAL_TABLET | Freq: Every day | ORAL | 2 refills | Status: DC

## 2022-06-18 NOTE — Progress Notes (Signed)
Chief Complaint  Patient presents with   Follow-up    6 month    Shoulder Pain    Right     Subjective Kimberly Mann is a 67 y.o. female who presents for hypertension follow up. She does not monitor home blood pressures. She is compliant with medications- Maxzide 37.5-25 mg/d, Norvasc 2.5 mg/d. Patient has these side effects of medication: none She is usually adhering to a healthy diet overall. Current exercise: walking No Cp or SOB.  Hyperlipidemia Patient presents for dyslipidemia follow up. Currently being treated with Crestor 5 mg/d and compliance with treatment thus far has been good. She confirms myalgias. Diet/exercise as above.  The patient is not known to have coexisting coronary artery disease.  R shoulder pain 5 mo of this, no inj or change in activity. Received injection last mo w pain specialist and it did not help. No bruising, redness, swelling, catching/locking.  She is having decreased range of motion.  No neurologic signs or symptoms.   Past Medical History:  Diagnosis Date   Anxiety    Cancer ([place])    breast   Chronic back pain    takes oxycodone   Complication of anesthesia    Eczema    Environmental allergies    Headache    Hypertension    PONV (postoperative nausea and vomiting)    Vertigo     Exam BP (!) 134/92 (BP Location: Left Arm, Cuff Size: Large)   Pulse 94   Temp 98.2 F (36.8 C) (Oral)   Ht 5' 5"$  (1.651 m)   Wt 226 lb 2 oz (102.6 kg)   SpO2 99%   BMI 37.63 kg/m  General:  well developed, well nourished, in no apparent distress Heart: RRR, no bruits, no LE edema Lungs: clear to auscultation, no accessory muscle use Neuro: Grip strength adequate bilaterally MSK: Right shoulder-decreased active and passive range of motion, positive Neer's, Hawkins, empty can on the right, negative crossover, resisted internal rotation, speeds, Spurling's Psych: well oriented with normal range of affect and appropriate  judgment/insight  Essential hypertension  Hyperlipidemia, unspecified hyperlipidemia type - Plan: Comprehensive metabolic panel, Lipid panel  Adhesive capsulitis of right shoulder - Plan: Ambulatory referral to Sports Medicine  Environmental allergies - Plan: Food Allergy Profile  Prediabetes - Plan: Hemoglobin A1c  Vitamin D insufficiency - Plan: VITAMIN D 25 Hydroxy (Vit-D Deficiency, Fractures)  Chronic, uncontrolled.  Continue Maxide 37.5-25 mg daily.  Go back on Caduet but increase amlodipine level to 5 mg.  Follow-up in 1 month recheck this.  Counseled on diet and exercise. Chronic, hopefully stable.  Stop Crestor, go back on above combination pill which she tolerated better. Stretches and exercises provided.  Heat, ice, Tylenol.  Refer to sports medicine for their opinion and possible imaging. The patient voiced understanding and agreement to the plan.  [place], DO 06/18/22  12:17 PM

## 2022-06-18 NOTE — Patient Instructions (Signed)
Give [place] 2-3 business days to get the results of your labs back.   Keep the diet clean and stay active.  Ice/cold pack over area for 10-15 min twice daily.  Heat (pad or rice pillow in microwave) over affected area, 10-15 minutes twice daily.   OK to take Tylenol 1000 mg (2 extra strength tabs) or 975 mg (3 regular strength tabs) every 6 hours as needed.  Let us know if you need anything.  EXERCISES  RANGE OF MOTION (ROM) AND STRETCHING EXERCISES These exercises may help you when beginning to rehabilitate your injury. While completing these exercises, remember:  Restoring tissue flexibility helps normal motion to return to the joints. This allows healthier, less painful movement and activity. An effective stretch should be held for at least 30 seconds. A stretch should never be painful. You should only feel a gentle lengthening or release in the stretched tissue.  ROM - Pendulum Bend at the waist so that your right / left arm falls away from your body. Support yourself with your opposite hand on a solid surface, such as a table or a countertop. Your right / left arm should be perpendicular to the ground. If it is not perpendicular, you need to lean over farther. Relax the muscles in your right / left arm and shoulder as much as possible. Gently sway your hips and trunk so they move your right / left arm without any use of your right / left shoulder muscles. Progress your movements so that your right / left arm moves side to side, then forward and backward, and finally, both clockwise and counterclockwise. Complete 10-15 repetitions in each direction. Many people use this exercise to relieve discomfort in their shoulder as well as to gain range of motion. Repeat 2 times. Complete this exercise 3 times per week.  STRETCH - Flexion, Standing Stand with good posture. With an underhand grip on your right / left hand and an overhand grip on the opposite hand, grasp a broomstick or cane so that  your hands are a little more than shoulder-width apart. Keeping your right / left elbow straight and shoulder muscles relaxed, push the stick with your opposite hand to raise your right / left arm in front of your body and then overhead. Raise your arm until you feel a stretch in your right / left shoulder, but before you have increased shoulder pain. Try to avoid shrugging your right / left shoulder as your arm rises by keeping your shoulder blade tucked down and toward your mid-back spine. Hold 30 seconds. Slowly return to the starting position. Repeat 2 times. Complete this exercise 3 times per week.  STRETCH - Internal Rotation Place your right / left hand behind your back, palm-up. Throw a towel or belt over your opposite shoulder. Grasp the towel/belt with your right / left hand. While keeping an upright posture, gently pull up on the towel/belt until you feel a stretch in the front of your right / left shoulder. Avoid shrugging your right / left shoulder as your arm rises by keeping your shoulder blade tucked down and toward your mid-back spine. Hold 30. Release the stretch by lowering your opposite hand. Repeat 2 times. Complete this exercise 3 times per week.  STRETCH - External Rotation and Abduction Stagger your stance through a doorframe. It does not matter which foot is forward. As instructed by your physician, physical therapist or athletic trainer, place your hands: And forearms above your head and on the door frame. And  forearms at head-height and on the door frame. At elbow-height and on the door frame. Keeping your head and chest upright and your stomach muscles tight to prevent over-extending your low-back, slowly shift your weight onto your front foot until you feel a stretch across your chest and/or in the front of your shoulders. Hold 30 seconds. Shift your weight to your back foot to release the stretch. Repeat 2 times. Complete this stretch 3 times per week.    STRENGTHENING EXERCISES  These exercises may help you when beginning to rehabilitate your injury. They may resolve your symptoms with or without further involvement from your physician, physical therapist or athletic trainer. While completing these exercises, remember:  Muscles can gain both the endurance and the strength needed for everyday activities through controlled exercises. Complete these exercises as instructed by your physician, physical therapist or athletic trainer. Progress the resistance and repetitions only as guided. You may experience muscle soreness or fatigue, but the pain or discomfort you are trying to eliminate should never worsen during these exercises. If this pain does worsen, stop and make certain you are following the directions exactly. If the pain is still present after adjustments, discontinue the exercise until you can discuss the trouble with your clinician. If advised by your physician, during your recovery, avoid activity or exercises which involve actions that place your right / left hand or elbow above your head or behind your back or head. These positions stress the tissues which are trying to heal.  STRENGTH - Scapular Depression and Adduction With good posture, sit on a firm chair. Supported your arms in front of you with pillows, arm rests or a table top. Have your elbows in line with the sides of your body. Gently draw your shoulder blades down and toward your mid-back spine. Gradually increase the tension without tensing the muscles along the top of your shoulders and the back of your neck. Hold for 3 seconds. Slowly release the tension and relax your muscles completely before completing the next repetition. After you have practiced this exercise, remove the arm support and complete it in standing as well as sitting. Repeat 2 times. Complete this exercise 3 times per week.   STRENGTH - External Rotators Secure a rubber exercise band/tubing to a fixed  object so that it is at the same height as your right / left elbow when you are standing or sitting on a firm surface. Stand or sit so that the secured exercise band/tubing is at your side that is not injured. Bend your elbow 90 degrees. Place a folded towel or small pillow under your right / left arm so that your elbow is a few inches away from your side. Keeping the tension on the exercise band/tubing, pull it away from your body, as if pivoting on your elbow. Be sure to keep your body steady so that the movement is only coming from your shoulder rotating. Hold 3 seconds. Release the tension in a controlled manner as you return to the starting position. Repeat 2 times. Complete this exercise 3 times per week.   STRENGTH - Supraspinatus Stand or sit with good posture. Grasp a 2-3 lb weight or an exercise band/tubing so that your hand is "thumbs-up," like when you shake hands. Slowly lift your right / left hand from your thigh into the air, traveling about 30 degrees from straight out at your side. Lift your hand to shoulder height or as far as you can without increasing any shoulder pain. Initially, many  people do not lift their hands above shoulder height. Avoid shrugging your right / left shoulder as your arm rises by keeping your shoulder blade tucked down and toward your mid-back spine. Hold for 3 seconds. Control the descent of your hand as you slowly return to your starting position. Repeat 2 times. Complete this exercise 3 times per week.   STRENGTH - Shoulder Extensors Secure a rubber exercise band/tubing so that it is at the height of your shoulders when you are either standing or sitting on a firm arm-less chair. With a thumbs-up grip, grasp an end of the band/tubing in each hand. Straighten your elbows and lift your hands straight in front of you at shoulder height. Step back away from the secured end of band/tubing until it becomes tense. Squeezing your shoulder blades together, pull  your hands down to the sides of your thighs. Do not allow your hands to go behind you. Hold for 3 seconds. Slowly ease the tension on the band/tubing as you reverse the directions and return to the starting position. Repeat 2 times. Complete this exercise 3 times per week.   STRENGTH - Scapular Retractors Secure a rubber exercise band/tubing so that it is at the height of your shoulders when you are either standing or sitting on a firm arm-less chair. With a palm-down grip, grasp an end of the band/tubing in each hand. Straighten your elbows and lift your hands straight in front of you at shoulder height. Step back away from the secured end of band/tubing until it becomes tense. Squeezing your shoulder blades together, draw your elbows back as you bend them. Keep your upper arm lifted away from your body throughout the exercise. Hold 3 seconds. Slowly ease the tension on the band/tubing as you reverse the directions and return to the starting position. Repeat 2 times. Complete this exercise 3 times per week.  STRENGTH - Scapular Depressors Find a sturdy chair without wheels, such as a from a dining room table. Keeping your feet on the floor, lift your bottom from the seat and lock your elbows. Keeping your elbows straight, allow gravity to pull your body weight down. Your shoulders will rise toward your ears. Raise your body against gravity by drawing your shoulder blades down your back, shortening the distance between your shoulders and ears. Although your feet should always maintain contact with the floor, your feet should progressively support less body weight as you get stronger. Hold 3 seconds. In a controlled and slow manner, lower your body weight to begin the next repetition. Repeat 2 times. Complete this exercise 3 times per week.    This information is not intended to replace advice given to you by your health care provider. Make sure you discuss any questions you have with your health  care provider.   Document Released: 02/27/2005 Document Revised: 05/06/2014 Document Reviewed: 07/28/2008 Elsevier Interactive Patient Education [employer].

## 2022-06-19 LAB — FOOD ALLERGY PROFILE
Allergen, Salmon, f41: <0.1 kU/L
Almonds: <0.1 kU/L
CLASS: 0
CLASS: 0
CLASS: 0
CLASS: 0
CLASS: 0
CLASS: 0
CLASS: 0
CLASS: 0
CLASS: 0
CLASS: 0
CLASS: 0
Cashew IgE: <0.1 kU/L
Class: 0
Class: 0
Class: 0
Class: 0
Egg White IgE: <0.1 kU/L
Fish Cod: <0.1 kU/L
Hazelnut: <0.1 kU/L
Milk IgE: <0.1 kU/L
Peanut IgE: <0.1 kU/L
Scallop IgE: <0.1 kU/L
Sesame Seed f10: <0.1 kU/L
Shrimp IgE: <0.1 kU/L
Soybean IgE: <0.1 kU/L
Tuna IgE: <0.1 kU/L
Walnut: <0.1 kU/L
Wheat IgE: <0.1 kU/L

## 2022-06-19 LAB — INTERPRETATION:

## 2022-06-26 ENCOUNTER — Ambulatory Visit (INDEPENDENT_AMBULATORY_CARE_PROVIDER_SITE_OTHER): Payer: Medicare HMO | Admitting: Family Medicine

## 2022-06-26 ENCOUNTER — Encounter: Payer: Self-pay | Admitting: Family Medicine

## 2022-06-26 ENCOUNTER — Telehealth: Payer: Self-pay | Admitting: Family Medicine

## 2022-06-26 ENCOUNTER — Ambulatory Visit: Payer: Self-pay

## 2022-06-26 VITALS — BP 178/108 | Ht 65.0 in | Wt 226.0 lb

## 2022-06-26 DIAGNOSIS — I10 Essential (primary) hypertension: Secondary | ICD-10-CM

## 2022-06-26 DIAGNOSIS — M19011 Primary osteoarthritis, right shoulder: Secondary | ICD-10-CM

## 2022-06-26 DIAGNOSIS — M609 Myositis, unspecified: Secondary | ICD-10-CM

## 2022-06-26 DIAGNOSIS — M7501 Adhesive capsulitis of right shoulder: Secondary | ICD-10-CM

## 2022-06-26 MED ORDER — TRIAMTERENE-HCTZ 37.5-25 MG PO TABS: ORAL_TABLET | ORAL | 2 refills | Status: DC

## 2022-06-26 MED ORDER — TRIAMCINOLONE ACETONIDE 40 MG/ML IJ SUSP
40.0000 mg | Freq: Once | INTRAMUSCULAR | Status: AC
  Administered 2022-06-26: 40 mg via INTRA_ARTICULAR

## 2022-06-26 NOTE — Telephone Encounter (Signed)
Patient states she is allergic to statins (prescribed 2/20)  She would like to continue the triamterene pill for BP prescribed in 2022   Pharmacy still [facility] [facility] on [address]    Patient said if we have any question to give her a call  [phone]

## 2022-06-26 NOTE — Assessment & Plan Note (Signed)
Acutely occurring.  Symptoms are more consistent with a degenerative change of the joint as to her limited range of motion given the effusion appreciated on exam. -Counseled on home exercise therapy and supportive care. -Glenohumeral injection today. -Could consider physical therapy or suprascapular nerve block.

## 2022-06-26 NOTE — Patient Instructions (Signed)
Nice to meet you Please try heat before exercise and ice after Please try the exercises   Please send me a message in [place] with any questions or updates.  Please see me back in 4 weeks.   --Dr. Raeford Razor

## 2022-06-26 NOTE — Progress Notes (Signed)
  Kimberly Mann - 66 y.o. female MRN [id number]  Date of birth: 1955-11-11  SUBJECTIVE:  Including CC & ROS.  No chief complaint on file.   Kimberly Mann is a 67 y.o. female that is presenting with acute on chronic right shoulder pain.  She is having pain globally around the shoulder.  She received a subacromial injection with no improvement.  Having limited range of motion in flexion external rotation..    Review of Systems See HPI   HISTORY: Past Medical, Surgical, Social, and Family History Reviewed & Updated per EMR.   Pertinent Historical Findings include:  Past Medical History:  Diagnosis Date   Anxiety    Cancer ([place])    breast   Chronic back pain    takes oxycodone   Complication of anesthesia    Eczema    Environmental allergies    Headache    Hypertension    PONV (postoperative nausea and vomiting)    Vertigo     Past Surgical History:  Procedure Laterality Date   BREAST REDUCTION SURGERY Left 11/20/2017   Procedure: left breast reduction for symmetry and liposuction bilateral lateral breasts;  Surgeon: Wallace Going, DO;  Location: [facility];  Service: Plastics;  Laterality: Left;   BREAST SURGERY     rt breast lumpectomy   HYSTEROSCOPY WITH D & C N/A 06/05/2021   Procedure: DILATATION AND CURETTAGE /HYSTEROSCOPY/MYOSURE RESECTION OF UTERINE FIBROIDS;  Surgeon: Lavonia Drafts, MD;  Location: [facility];  Service: Gynecology;  Laterality: N/A;   LIPOSUCTION     TUBAL LIGATION       PHYSICAL EXAM:  VS: BP (!) 178/108 (BP Location: Left Arm, Patient Position: Sitting)   Ht 5' 5"$  (1.651 m)   Wt 226 lb (102.5 kg)   BMI 37.61 kg/m  Physical Exam Gen: NAD, alert, cooperative with exam, well-appearing MSK:  Neurovascularly intact    Limited ultrasound: Right shoulder pain:  Effusion noted within the bicipital tendon sheath. Effusion noted within the posterior glenohumeral joint.  Summary: Effusion noted  within the shoulder joint consistent with degenerative changes.  Ultrasound and interpretation by Clearance Coots, MD  Aspiration/Injection Procedure Note Ronise Sanguino 05/30/1955  Procedure: Injection Indications: right shoulder pain  Procedure Details Consent: Risks of procedure as well as the alternatives and risks of each were explained to the (patient/caregiver).  Consent for procedure obtained. Time Out: Verified patient identification, verified procedure, site/side was marked, verified correct patient position, special equipment/implants available, medications/allergies/relevent history reviewed, required imaging and test results available.  Performed.  The area was cleaned with iodine and alcohol swabs.    The right glenohumeral joint was injected with 4 cc of 1% lidocaine and .4 cc 1 cc's of 8.4% sodium bicarbonate on a 22-gauge 3-1/2 inch needle.  The syringe was switched to mixture containing 3 cc of 1% lidocaine, 40 mg Kenalog and 3 cc's of 0.25% bupivacaine was injected.  Ultrasound was used. Images were obtained in short views showing the injection.     A sterile dressing was applied.  Patient did tolerate procedure well.     ASSESSMENT & PLAN:   OA (osteoarthritis) of shoulder Acutely occurring.  Symptoms are more consistent with a degenerative change of the joint as to her limited range of motion given the effusion appreciated on exam. -Counseled on home exercise therapy and supportive care. -Glenohumeral injection today. -Could consider physical therapy or suprascapular nerve block.

## 2022-06-30 ENCOUNTER — Other Ambulatory Visit: Payer: Self-pay | Admitting: Neurology

## 2022-07-01 NOTE — Telephone Encounter (Signed)
Pt was seen on 05/23/22 per note " Injection of right subacromial bursa with 40 mg Depo-Medrol in 3 cc lidocaine using sterile technique."  [facility] said patient is reaching out to get refill on diflucan 150 mg tablet #15. You prescribed #15 tablets at visit on 05/23/22.

## 2022-07-09 ENCOUNTER — Other Ambulatory Visit: Payer: Self-pay | Admitting: Neurology

## 2022-07-09 DIAGNOSIS — M545 Low back pain, unspecified: Secondary | ICD-10-CM

## 2022-07-09 MED ORDER — OXYCODONE-ACETAMINOPHEN 10-325 MG PO TABS: 1.0000 | ORAL_TABLET | Freq: Four times a day (QID) | ORAL | 0 refills | Status: DC | PRN

## 2022-07-09 NOTE — Telephone Encounter (Signed)
Pt is needing a refill request for her oxyCODONE-acetaminophen (PERCOCET) 10-325 MG tablet sent to the [facility] in [facility]

## 2022-07-09 NOTE — Telephone Encounter (Signed)
Last seen 05/23/22 and next f/u 09/19/22. Per drug registry, last refilled 06/11/22 #120.

## 2022-07-15 ENCOUNTER — Telehealth: Payer: Self-pay | Admitting: Family Medicine

## 2022-07-15 NOTE — Telephone Encounter (Signed)
Contacted Kimberly Mann to schedule their annual wellness visit. Appointment made for 07/23/2022.  Sherol Dade; Care Guide Ambulatory Clinical Support [facility] Direct Dial: [phone] *

## 2022-07-17 ENCOUNTER — Encounter: Payer: Self-pay | Admitting: Family Medicine

## 2022-07-17 ENCOUNTER — Ambulatory Visit (INDEPENDENT_AMBULATORY_CARE_PROVIDER_SITE_OTHER): Payer: Medicare HMO | Admitting: Family Medicine

## 2022-07-17 VITALS — BP 128/82 | HR 98 | Temp 98.1°F | Ht 65.0 in | Wt 224.0 lb

## 2022-07-17 DIAGNOSIS — L309 Dermatitis, unspecified: Secondary | ICD-10-CM

## 2022-07-17 DIAGNOSIS — I10 Essential (primary) hypertension: Secondary | ICD-10-CM

## 2022-07-17 MED ORDER — TRIAMTERENE-HCTZ 37.5-25 MG PO TABS: 1.0000 | ORAL_TABLET | Freq: Every day | ORAL | 2 refills | Status: DC

## 2022-07-17 MED ORDER — TIZANIDINE HCL 4 MG PO TABS: 4.0000 mg | ORAL_TABLET | Freq: Four times a day (QID) | ORAL | 0 refills | Status: DC | PRN

## 2022-07-17 NOTE — Progress Notes (Signed)
Chief Complaint  Patient presents with   Follow-up    Blood pressure     Subjective Kimberly Mann is a 67 y.o. female who presents for hypertension follow up. She does not monitor home blood pressures. She is compliant with medications Maxzide 37.5-25 mg/d. Patient has these side effects of medication: none She is sometimes adhering to a healthy diet overall. Current exercise: walking No Cp or SOB .   Dealing w eczema on face. Requesting referral to derm.   Past Medical History:  Diagnosis Date   Anxiety    Cancer ([place])    breast   Chronic back pain    takes oxycodone   Complication of anesthesia    Eczema    Environmental allergies    Headache    Hypertension    PONV (postoperative nausea and vomiting)    Vertigo     Exam BP (!) 138/92 (BP Location: Left Arm, Patient Position: Sitting, Cuff Size: Large)   Pulse 98   Temp 98.1 F (36.7 C) (Oral)   Ht 5\' 5"  (1.651 m)   Wt 224 lb (101.6 kg)   SpO2 98%   BMI 37.28 kg/m  General:  well developed, well nourished, in no apparent distress Heart: RRR, no bruits, no LE edema Lungs: clear to auscultation, no accessory muscle use Psych: well oriented with normal range of affect and appropriate judgment/insight  Essential hypertension - Plan: triamterene-hydrochlorothiazide (MAXZIDE-25) 37.5-25 MG tablet  Eczema, unspecified type - Plan: Ambulatory referral to Dermatology  Chronic, stable. Cont Maxzide HCT 37.5-25 mg/d. Counseled on diet and exercise. Refer to derm at her request. 1% OTC HC cream on face, not in eye/mouth. Avoid scented products.  F/u in 6 mo. The patient voiced understanding and agreement to the plan.  [place], DO 07/17/22  11:14 AM

## 2022-07-17 NOTE — Patient Instructions (Addendum)
Keep the diet clean and stay active.  If you do not hear anything about your referral in the next 1-2 weeks, call our office and ask for an update.  Try not to scratch as this can make things worse. Avoid scented products while dealing with this. You may resume when the itchiness resolves. Cold/cool compresses can help.   Let us know if you need anything.

## 2022-07-23 ENCOUNTER — Ambulatory Visit (INDEPENDENT_AMBULATORY_CARE_PROVIDER_SITE_OTHER): Payer: Medicare HMO | Admitting: *Deleted

## 2022-07-23 DIAGNOSIS — Z0000 Encounter for general adult medical examination without abnormal findings: Secondary | ICD-10-CM

## 2022-07-23 NOTE — Progress Notes (Signed)
Subjective:   Kimberly Mann is a 67 y.o. female who presents for Medicare Annual (Subsequent) preventive examination.  I connected with  Aldona Bar on 07/23/22 by a audio enabled telemedicine application and verified that I am speaking with the correct person using two identifiers.  Patient Location: Home  Provider Location: Office/Clinic  I discussed the limitations of evaluation and management by telemedicine. The patient expressed understanding and agreed to proceed.   Review of Systems     Cardiac Risk Factors include: advanced age (>103men, >23 women);dyslipidemia;hypertension     Objective:    Today's Vitals   07/23/22 1422  PainSc: 9    There is no height or weight on file to calculate BMI.     07/23/2022    2:41 PM 04/08/2022    2:07 PM 06/05/2021   10:23 AM 02/01/2021   11:49 AM 09/14/2020    1:59 PM 11/26/2019    1:53 PM 03/28/2019    7:14 AM  Advanced Directives  Does Patient Have a Medical Advance Directive? No No No No No No No  Would patient like information on creating a medical advance directive? No - Patient declined No - Patient declined No - Patient declined No - Patient declined No - Patient declined      Current Medications (verified) Outpatient Encounter Medications as of 07/23/2022  Medication Sig   diclofenac sodium (VOLTAREN) 1 % GEL Apply 2 g topically 4 (four) times daily as needed.   fluconazole (DIFLUCAN) 150 MG tablet Take 1 tablet by mouth once daily   fluticasone (FLONASE) 50 MCG/ACT nasal spray Place 2 sprays into both nostrils daily.   hydrOXYzine (VISTARIL) 25 MG capsule Take 1 capsule (25 mg total) by mouth 3 (three) times daily as needed.   levocetirizine (XYZAL) 5 MG tablet TAKE 1 TABLET BY MOUTH ONCE DAILY IN THE EVENING   lidocaine (XYLOCAINE) 5 % ointment Apply 1 application. topically as needed.   Multiple Vitamins-Minerals (MULTIVITAMIN ADULT PO)    oxyCODONE-acetaminophen (PERCOCET) 10-325 MG tablet Take 1 tablet by  mouth every 6 (six) hours as needed for pain.   pantoprazole (PROTONIX) 40 MG tablet Take 1 tablet (40 mg total) by mouth daily.   tamoxifen (NOLVADEX) 20 MG tablet Take 20 mg by mouth daily.   tiZANidine (ZANAFLEX) 4 MG tablet Take 1 tablet (4 mg total) by mouth every 6 (six) hours as needed for muscle spasms.   triamcinolone cream (KENALOG) 0.1 % Apply 1 Application topically 2 (two) times daily.   triamterene-hydrochlorothiazide (MAXZIDE-25) 37.5-25 MG tablet Take 1 tablet by mouth daily. Take 1/2 (one-half) tablet by mouth once daily   Vitamin D, Ergocalciferol, (DRISDOL) 1.25 MG (50000 UNIT) CAPS capsule Take 1 capsule by mouth once a week   [DISCONTINUED] butalbital-acetaminophen-caffeine (FIORICET) 50-325-40 MG tablet Take one po qd prn headache   [DISCONTINUED] montelukast (SINGULAIR) 10 MG tablet Take 1 tablet (10 mg total) by mouth at bedtime.   No facility-administered encounter medications on file as of 07/23/2022.    Allergies (verified) Morphine and related, Penicillins, Statins, and Sulfa antibiotics   History: Past Medical History:  Diagnosis Date   Anxiety    Cancer ([place])    breast   Chronic back pain    takes oxycodone   Complication of anesthesia    Eczema    Environmental allergies    Headache    Hypertension    PONV (postoperative nausea and vomiting)    Vertigo    Past Surgical History:  Procedure Laterality Date  BREAST REDUCTION SURGERY Left 11/20/2017   Procedure: left breast reduction for symmetry and liposuction bilateral lateral breasts;  Surgeon: Wallace Going, DO;  Location: [facility];  Service: Plastics;  Laterality: Left;   BREAST SURGERY     rt breast lumpectomy   HYSTEROSCOPY WITH D & C N/A 06/05/2021   Procedure: DILATATION AND CURETTAGE /HYSTEROSCOPY/MYOSURE RESECTION OF UTERINE FIBROIDS;  Surgeon: Lavonia Drafts, MD;  Location: [facility];  Service: Gynecology;  Laterality: N/A;   LIPOSUCTION      TUBAL LIGATION     Family History  Problem Relation Age of Onset   Hypertension Mother    Social History   Socioeconomic History   Marital status: Legally Separated    Spouse name: Not on file   Number of children: Not on file   Years of education: Not on file   Highest education level: Not on file  Occupational History   Not on file  Tobacco Use   Smoking status: Former    Types: Cigarettes    Quit date: 05/17/1974    Years since quitting: 48.2   Smokeless tobacco: Never  Vaping Use   Vaping Use: Never used  Substance and Sexual Activity   Alcohol use: Not Currently    Alcohol/week: 7.0 standard drinks of alcohol    Types: 7 Standard drinks or equivalent per week   Drug use: No    Types: Marijuana    Comment: Last use 06/04/21   Sexual activity: Not on file  Other Topics Concern   Not on file  Social History Narrative   Not on file   Social Determinants of Health   Financial Resource Strain: Low Risk  (02/01/2021)   Overall Financial Resource Strain (CARDIA)    Difficulty of Paying Living Expenses: Not hard at all  Food Insecurity: No Food Insecurity (07/23/2022)   Hunger Vital Sign    Worried About Running Out of Food in the Last Year: Never true    Ran Out of Food in the Last Year: Never true  Transportation Needs: No Transportation Needs (07/23/2022)   PRAPARE - Hydrologist (Medical): No    Lack of Transportation (Non-Medical): No  Physical Activity: Inactive (02/01/2021)   Exercise Vital Sign    Days of Exercise per Week: 0 days    Minutes of Exercise per Session: 0 min  Stress: No Stress Concern Present (02/01/2021)   [place]    Feeling of Stress : Not at all  Social Connections: Moderately Isolated (02/01/2021)   Social Connection and Isolation Panel [NHANES]    Frequency of Communication with Friends and Family: More than three times a week    Frequency of  Social Gatherings with Friends and Family: More than three times a week    Attends Religious Services: More than 4 times per year    Active Member of [employer] or Organizations: No    Attends Archivist Meetings: Never    Marital Status: Separated    Tobacco Counseling Counseling given: Not Answered   Clinical Intake:  Pre-visit preparation completed: Yes  Pain : 0-10 Pain Score: 9  Pain Location: Shoulder Pain Orientation: Right Pain Descriptors / Indicators: Aching Pain Onset: More than a month ago Pain Frequency: Constant     Diabetes: No  How often do you need to have someone help you when you read instructions, pamphlets, or other written materials from your doctor or  pharmacy?: 1 - Never   Activities of Daily Living    07/23/2022    2:32 PM  In your present state of health, do you have any difficulty performing the following activities:  Hearing? 0  Vision? 0  Difficulty concentrating or making decisions? 0  Walking or climbing stairs? 1  Dressing or bathing? 0  Comment currently having difficulty due to shoulder pain  Doing errands, shopping? 0  Preparing Food and eating ? N  Using the Toilet? N  In the past six months, have you accidently leaked urine? N  Do you have problems with loss of bowel control? N  Managing your Medications? N  Managing your Finances? N  Housekeeping or managing your Housekeeping? N    Patient Care Team: Shelda Pal, DO as PCP - General (Family Medicine)  Indicate any recent Medical Services you may have received from other than [facility] providers in the past year (date may be approximate).     Assessment:   This is a routine wellness examination for Kimberly Mann.  Hearing/Vision screen No results found.  Dietary issues and exercise activities discussed: Current Exercise Habits: The patient does not participate in regular exercise at present (just walks around in her house), Exercise limited by: orthopedic  condition(s)   Goals Addressed   None    Depression Screen    07/23/2022    2:28 PM 06/18/2022   11:13 AM 02/01/2021   11:52 AM 12/01/2020    9:10 AM 11/02/2019    9:59 AM  PHQ 2/9 Scores  PHQ - 2 Score 1 2 0 0 0  PHQ- 9 Score  6       Fall Risk    07/23/2022    2:25 PM 06/18/2022   11:13 AM 02/01/2021   11:50 AM  Fall Risk   Falls in the past year? 1 0 1  Comment from vertigo    Number falls in past yr: 0 0 1  Injury with Fall? 0 0 0  Risk for fall due to : History of fall(s) No Fall Risks History of fall(s)  Follow up Falls evaluation completed Falls evaluation completed Falls prevention discussed    FALL RISK PREVENTION PERTAINING TO THE HOME:  Any stairs in or around the home? No  Home free of loose throw rugs in walkways, pet beds, electrical cords, etc? Yes  Adequate lighting in your home to reduce risk of falls? Yes   ASSISTIVE DEVICES UTILIZED TO PREVENT FALLS:  Life alert? No  Use of a cane, walker or w/c? No  Grab bars in the bathroom? Yes  Shower chair or bench in shower? Yes  Elevated toilet seat or a handicapped toilet? Yes   TIMED UP AND GO:  Was the test performed?  No, audio visit .   Cognitive Function:        07/23/2022    2:38 PM  6CIT Screen  What Year? 0 points  What month? 0 points  What time? 0 points  Count back from 20 0 points  Months in reverse 4 points  Repeat phrase 2 points  Total Score 6 points    Immunizations Immunization History  Administered Date(s) Administered   Moderna Sars-Covid-2 Vaccination 08/31/2019, 09/28/2019   Pneumococcal Polysaccharide-23 11/02/2019   Tdap 07/23/2017    TDAP status: Up to date  Flu Vaccine status: Declined, Education has been provided regarding the importance of this vaccine but patient still declined. Advised may receive this vaccine at local pharmacy or Health Dept. Aware  to provide a copy of the vaccination record if obtained from local pharmacy or Health Dept. Verbalized acceptance  and understanding.  Pneumococcal vaccine status: Declined,  Education has been provided regarding the importance of this vaccine but patient still declined. Advised may receive this vaccine at local pharmacy or Health Dept. Aware to provide a copy of the vaccination record if obtained from local pharmacy or Health Dept. Verbalized acceptance and understanding.   Covid-19 vaccine status: Declined, Education has been provided regarding the importance of this vaccine but patient still declined. Advised may receive this vaccine at local pharmacy or Health Dept.or vaccine clinic. Aware to provide a copy of the vaccination record if obtained from local pharmacy or Health Dept. Verbalized acceptance and understanding.  Qualifies for Shingles Vaccine? Yes   Zostavax completed No   Shingrix Completed?: No.    Education has been provided regarding the importance of this vaccine. Patient has been advised to call insurance company to determine out of pocket expense if they have not yet received this vaccine. Advised may also receive vaccine at local pharmacy or Health Dept. Verbalized acceptance and understanding.  Screening Tests Health Maintenance  Topic Date Due   Medicare Annual Wellness (AWV)  02/01/2022   INFLUENZA VACCINE  07/28/2022 (Originally 11/27/2021)   DTaP/Tdap/Td (2 - Td or Tdap) 07/24/2027   COLONOSCOPY (Pts 45-56yrs Insurance coverage will need to be confirmed)  11/01/2028   DEXA SCAN  Completed   Hepatitis C Screening  Completed   HPV VACCINES  Aged Out   Pneumonia Vaccine 27+ Years old  Discontinued   MAMMOGRAM  Discontinued   COVID-19 Vaccine  Discontinued   Zoster Vaccines- Shingrix  Discontinued    Health Maintenance  Health Maintenance Due  Topic Date Due   Medicare Annual Wellness (AWV)  02/01/2022    Colorectal cancer screening: Type of screening: Colonoscopy. Completed 11/02/18. Repeat every 10 years  Mammogram status: Completed 10/22/21. Repeat every year with  [facility]  Bone Density status: Completed 02/07/22. Results reflect: Bone density results: OSTEOPENIA. Repeat every 2 years.  Lung Cancer Screening: (Low Dose CT Chest recommended if Age 26-80 years, 30 pack-year currently smoking OR have quit w/in 15years.) does not qualify.   Additional Screening:  Hepatitis C Screening: does qualify; Completed 04/01/18  Vision Screening: Recommended annual ophthalmology exams for early detection of glaucoma and other disorders of the eye. Is the patient up to date with their annual eye exam?  Yes  Who is the provider or what is the name of the office in which the patient attends annual eye exams? Dr. Belenda Cruise [place] If pt is not established with a provider, would they like to be referred to a provider to establish care? No .   Dental Screening: Recommended annual dental exams for proper oral hygiene  Community Resource Referral / Chronic Care Management: CRR required this visit?  No   CCM required this visit?  No      Plan:     I have personally reviewed and noted the following in the patient's chart:   Medical and social history Use of alcohol, tobacco or illicit drugs  Current medications and supplements including opioid prescriptions. Patient is currently taking opioid prescriptions. Information provided to patient regarding non-opioid alternatives. Patient advised to discuss non-opioid treatment plan with their provider. Functional ability and status Nutritional status Physical activity Advanced directives List of other physicians Hospitalizations, surgeries, and ER visits in previous 12 months Vitals Screenings to include cognitive, depression, and falls Referrals and appointments  In addition, I have reviewed and discussed with patient certain preventive protocols, quality metrics, and best practice recommendations. A written personalized care plan for preventive services as well as general preventive health recommendations were  provided to patient.   Due to this being a telephonic visit, the after visit summary with patients personalized plan was offered to patient via mail or my-chart. Patient would like to access on my-chart.  Beatris Ship, [place]   07/23/2022   Nurse Notes: None

## 2022-07-23 NOTE — Patient Instructions (Signed)
Kimberly Mann , Thank you for taking time to come for your Medicare Wellness Visit. I appreciate your ongoing commitment to your health goals. Please review the following plan we discussed and let me know if I can assist you in the future.     This is a list of the screening recommended for you and due dates:  Health Maintenance  Topic Date Due   Flu Shot  07/28/2022*   Medicare Annual Wellness Visit  07/23/2023   DTaP/Tdap/Td vaccine (2 - Td or Tdap) 07/24/2027   Colon Cancer Screening  11/01/2028   DEXA scan (bone density measurement)  Completed   Hepatitis C Screening: USPSTF Recommendation to screen - Ages 32-79 yo.  Completed   HPV Vaccine  Aged Out   Pneumonia Vaccine  Discontinued   Mammogram  Discontinued   COVID-19 Vaccine  Discontinued   Zoster (Shingles) Vaccine  Discontinued  *Topic was postponed. The date shown is not the original due date.    Next appointment: Follow up in one year for your annual wellness visit.   Preventive Care 58 Years and Older, Female Preventive care refers to lifestyle choices and visits with your health care provider that can promote health and wellness. What does preventive care include? A yearly physical exam. This is also called an annual well check. Dental exams once or twice a year. Routine eye exams. Ask your health care provider how often you should have your eyes checked. Personal lifestyle choices, including: Daily care of your teeth and gums. Regular physical activity. Eating a healthy diet. Avoiding tobacco and drug use. Limiting alcohol use. Practicing safe sex. Taking low-dose aspirin every day. Taking vitamin and mineral supplements as recommended by your health care provider. What happens during an annual well check? The services and screenings done by your health care provider during your annual well check will depend on your age, overall health, lifestyle risk factors, and family history of disease. Counseling  Your  health care provider may ask you questions about your: Alcohol use. Tobacco use. Drug use. Emotional well-being. Home and relationship well-being. Sexual activity. Eating habits. History of falls. Memory and ability to understand (cognition). Work and work Statistician. Reproductive health. Screening  You may have the following tests or measurements: Height, weight, and BMI. Blood pressure. Lipid and cholesterol levels. These may be checked every 5 years, or more frequently if you are over 70 years old. Skin check. Lung cancer screening. You may have this screening every year starting at age 73 if you have a 30-pack-year history of smoking and currently smoke or have quit within the past 15 years. Fecal occult blood test (FOBT) of the stool. You may have this test every year starting at age 107. Flexible sigmoidoscopy or colonoscopy. You may have a sigmoidoscopy every 5 years or a colonoscopy every 10 years starting at age 74. Hepatitis C blood test. Hepatitis B blood test. Sexually transmitted disease (STD) testing. Diabetes screening. This is done by checking your blood sugar (glucose) after you have not eaten for a while (fasting). You may have this done every 1-3 years. Bone density scan. This is done to screen for osteoporosis. You may have this done starting at age 48. Mammogram. This may be done every 1-2 years. Talk to your health care provider about how often you should have regular mammograms. Talk with your health care provider about your test results, treatment options, and if necessary, the need for more tests. Vaccines  Your health care provider may recommend certain  vaccines, such as: Influenza vaccine. This is recommended every year. Tetanus, diphtheria, and acellular pertussis (Tdap, Td) vaccine. You may need a Td booster every 10 years. Zoster vaccine. You may need this after age 55. Pneumococcal 13-valent conjugate (PCV13) vaccine. One dose is recommended after age  23. Pneumococcal polysaccharide (PPSV23) vaccine. One dose is recommended after age 73. Talk to your health care provider about which screenings and vaccines you need and how often you need them. This information is not intended to replace advice given to you by your health care provider. Make sure you discuss any questions you have with your health care provider. Document Released: 05/12/2015 Document Revised: 01/03/2016 Document Reviewed: 02/14/2015 Elsevier Interactive Patient Education  2017 [employer] Prevention in the Home Falls can cause injuries. They can happen to people of all ages. There are many things you can do to make your home safe and to help prevent falls. What can I do on the outside of my home? Regularly fix the edges of walkways and driveways and fix any cracks. Remove anything that might make you trip as you walk through a door, such as a raised step or threshold. Trim any bushes or trees on the path to your home. Use bright outdoor lighting. Clear any walking paths of anything that might make someone trip, such as rocks or tools. Regularly check to see if handrails are loose or broken. Make sure that both sides of any steps have handrails. Any raised decks and porches should have guardrails on the edges. Have any leaves, snow, or ice cleared regularly. Use sand or salt on walking paths during winter. Clean up any spills in your garage right away. This includes oil or grease spills. What can I do in the bathroom? Use night lights. Install grab bars by the toilet and in the tub and shower. Do not use towel bars as grab bars. Use non-skid mats or decals in the tub or shower. If you need to sit down in the shower, use a plastic, non-slip stool. Keep the floor dry. Clean up any water that spills on the floor as soon as it happens. Remove soap buildup in the tub or shower regularly. Attach bath mats securely with double-sided non-slip rug tape. Do not have throw  rugs and other things on the floor that can make you trip. What can I do in the bedroom? Use night lights. Make sure that you have a light by your bed that is easy to reach. Do not use any sheets or blankets that are too big for your bed. They should not hang down onto the floor. Have a firm chair that has side arms. You can use this for support while you get dressed. Do not have throw rugs and other things on the floor that can make you trip. What can I do in the kitchen? Clean up any spills right away. Avoid walking on wet floors. Keep items that you use a lot in easy-to-reach places. If you need to reach something above you, use a strong step stool that has a grab bar. Keep electrical cords out of the way. Do not use floor polish or wax that makes floors slippery. If you must use wax, use non-skid floor wax. Do not have throw rugs and other things on the floor that can make you trip. What can I do with my stairs? Do not leave any items on the stairs. Make sure that there are handrails on both sides of the stairs  and use them. Fix handrails that are broken or loose. Make sure that handrails are as long as the stairways. Check any carpeting to make sure that it is firmly attached to the stairs. Fix any carpet that is loose or worn. Avoid having throw rugs at the top or bottom of the stairs. If you do have throw rugs, attach them to the floor with carpet tape. Make sure that you have a light switch at the top of the stairs and the bottom of the stairs. If you do not have them, ask someone to add them for you. What else can I do to help prevent falls? Wear shoes that: Do not have high heels. Have rubber bottoms. Are comfortable and fit you well. Are closed at the toe. Do not wear sandals. If you use a stepladder: Make sure that it is fully opened. Do not climb a closed stepladder. Make sure that both sides of the stepladder are locked into place. Ask someone to hold it for you, if  possible. Clearly mark and make sure that you can see: Any grab bars or handrails. First and last steps. Where the edge of each step is. Use tools that help you move around (mobility aids) if they are needed. These include: Canes. Walkers. Scooters. Crutches. Turn on the lights when you go into a dark area. Replace any light bulbs as soon as they burn out. Set up your furniture so you have a clear path. Avoid moving your furniture around. If any of your floors are uneven, fix them. If there are any pets around you, be aware of where they are. Review your medicines with your doctor. Some medicines can make you feel dizzy. This can increase your chance of falling. Ask your doctor what other things that you can do to help prevent falls. This information is not intended to replace advice given to you by your health care provider. Make sure you discuss any questions you have with your health care provider. Document Released: 02/09/2009 Document Revised: 09/21/2015 Document Reviewed: 05/20/2014 Elsevier Interactive Patient Education  2017 [employer].

## 2022-07-24 ENCOUNTER — Ambulatory Visit (INDEPENDENT_AMBULATORY_CARE_PROVIDER_SITE_OTHER): Payer: Medicare HMO | Admitting: Family Medicine

## 2022-07-24 ENCOUNTER — Ambulatory Visit (HOSPITAL_BASED_OUTPATIENT_CLINIC_OR_DEPARTMENT_OTHER)
Admission: RE | Admit: 2022-07-24 | Discharge: 2022-07-24 | Disposition: A | Discharge status: Home / Self Care | Payer: Medicare HMO | Source: Ambulatory Visit | Attending: Family Medicine | Admitting: Family Medicine

## 2022-07-24 ENCOUNTER — Encounter: Payer: Self-pay | Admitting: Family Medicine

## 2022-07-24 VITALS — BP 138/80 | Ht 65.0 in | Wt 224.0 lb

## 2022-07-24 DIAGNOSIS — M19011 Primary osteoarthritis, right shoulder: Secondary | ICD-10-CM

## 2022-07-24 DIAGNOSIS — L2089 Other atopic dermatitis: Secondary | ICD-10-CM | POA: Diagnosis not present

## 2022-07-24 MED ORDER — METHYLPREDNISOLONE ACETATE 40 MG/ML IJ SUSP
40.0000 mg | Freq: Once | INTRAMUSCULAR | Status: AC
  Administered 2022-07-24: 40 mg via INTRAMUSCULAR

## 2022-07-24 NOTE — Progress Notes (Signed)
  Kimberly Mann - 67 y.o. female MRN [id number]  Date of birth: 07-19-1955  SUBJECTIVE:  Including CC & ROS.  No chief complaint on file.   Kimberly Mann is a 67 y.o. female that is  presenting with acute worsening of the right shoulder pain. Did well until about three days ago. She has sharp pain and stiffness. Localized to the shoulder.    Review of Systems See HPI   HISTORY: Past Medical, Surgical, Social, and Family History Reviewed & Updated per EMR.   Pertinent Historical Findings include:  Past Medical History:  Diagnosis Date   Anxiety    Cancer ([place])    breast   Chronic back pain    takes oxycodone   Complication of anesthesia    Eczema    Environmental allergies    Headache    Hypertension    PONV (postoperative nausea and vomiting)    Vertigo     Past Surgical History:  Procedure Laterality Date   BREAST REDUCTION SURGERY Left 11/20/2017   Procedure: left breast reduction for symmetry and liposuction bilateral lateral breasts;  Surgeon: Wallace Going, DO;  Location: [place];  Service: Plastics;  Laterality: Left;   BREAST SURGERY     rt breast lumpectomy   HYSTEROSCOPY WITH D & C N/A 06/05/2021   Procedure: DILATATION AND CURETTAGE /HYSTEROSCOPY/MYOSURE RESECTION OF UTERINE FIBROIDS;  Surgeon: Lavonia Drafts, MD;  Location: [place];  Service: Gynecology;  Laterality: N/A;   LIPOSUCTION     TUBAL LIGATION       PHYSICAL EXAM:  VS: BP 138/80 (BP Location: Left Arm, Patient Position: Sitting)   Ht 5\' 5"  (1.651 m)   Wt 224 lb (101.6 kg)   BMI 37.28 kg/m  Physical Exam Gen: NAD, alert, cooperative with exam, well-appearing MSK:  Neurovascularly intact       ASSESSMENT & PLAN:   OA (osteoarthritis) of shoulder Acutely occurring over the past few days. Did well with the previous intra-articular injection.  - counseled on home exercise therapy and supportive care - IM depo medrol  - xray  -  referral to physical therapy  - could consider nerve block

## 2022-07-24 NOTE — Addendum Note (Signed)
Addended by: Cresenciano Lick on: 07/24/2022 12:07 PM   Modules accepted: Orders

## 2022-07-24 NOTE — Patient Instructions (Signed)
Good to see you Please alternate heat and ice  Give [place] the information for physical therapist and we can make the referral  I will call with the xray results.   Please send me a message in MyChart with any questions or updates.  Please see me back in 6-8 weeks.   --Dr. Raeford Razor

## 2022-07-24 NOTE — Assessment & Plan Note (Signed)
Acutely occurring over the past few days. Did well with the previous intra-articular injection.  - counseled on home exercise therapy and supportive care - IM depo medrol  - xray  - referral to physical therapy  - could consider nerve block

## 2022-07-29 ENCOUNTER — Telehealth: Payer: Self-pay | Admitting: Family Medicine

## 2022-07-29 NOTE — Telephone Encounter (Signed)
Left VM for patient. If she calls back please have her speak with a nurse/CMA and inform that her xrays are showing degenerative changes of the shoulder joint.   If any questions then please take the best time and phone number to call and I will try to call her back.   Rosemarie Ax, MD [facility] 07/29/2022, 9:05 AM

## 2022-08-06 ENCOUNTER — Telehealth: Payer: Self-pay | Admitting: *Deleted

## 2022-08-06 DIAGNOSIS — M19011 Primary osteoarthritis, right shoulder: Secondary | ICD-10-CM

## 2022-08-06 NOTE — Telephone Encounter (Signed)
-----   Message from Carl Best sent at 08/06/2022  2:43 PM EDT ----- Regarding: Pt's uses [facility] for phyiscal therapy Patient called ask that we send PT order to [facility] @ Fax# [phone] --office ph# [phone]

## 2022-08-06 NOTE — Telephone Encounter (Signed)
Pt informed of below.  

## 2022-08-06 NOTE — Telephone Encounter (Signed)
PT referral faxed to number below

## 2022-08-07 ENCOUNTER — Other Ambulatory Visit: Payer: Self-pay | Admitting: Neurology

## 2022-08-07 DIAGNOSIS — M545 Low back pain, unspecified: Secondary | ICD-10-CM

## 2022-08-07 MED ORDER — OXYCODONE-ACETAMINOPHEN 10-325 MG PO TABS
1.0000 | ORAL_TABLET | Freq: Four times a day (QID) | ORAL | 0 refills | Status: DC | PRN
Start: 2022-08-07 — End: 2022-09-06

## 2022-08-07 NOTE — Telephone Encounter (Signed)
Pt is needing a refill of her oxyCODONE-acetaminophen (PERCOCET) 10-325 MG tablet sent in to the [facility] in [place]

## 2022-08-07 NOTE — Telephone Encounter (Signed)
Pt last seen 05/23/22 and next f/u 09/19/22. Last refilled 07/10/22 #30.

## 2022-08-12 ENCOUNTER — Encounter: Payer: Self-pay | Admitting: *Deleted

## 2022-08-21 ENCOUNTER — Other Ambulatory Visit: Payer: Self-pay | Admitting: Family Medicine

## 2022-08-21 DIAGNOSIS — M25511 Pain in right shoulder: Secondary | ICD-10-CM | POA: Diagnosis not present

## 2022-08-21 DIAGNOSIS — Z7409 Other reduced mobility: Secondary | ICD-10-CM | POA: Diagnosis not present

## 2022-08-21 DIAGNOSIS — Z885 Allergy status to narcotic agent status: Secondary | ICD-10-CM | POA: Diagnosis not present

## 2022-08-21 DIAGNOSIS — M19011 Primary osteoarthritis, right shoulder: Secondary | ICD-10-CM | POA: Diagnosis not present

## 2022-08-21 DIAGNOSIS — R29898 Other symptoms and signs involving the musculoskeletal system: Secondary | ICD-10-CM | POA: Diagnosis not present

## 2022-08-21 DIAGNOSIS — M6281 Muscle weakness (generalized): Secondary | ICD-10-CM | POA: Diagnosis not present

## 2022-08-21 DIAGNOSIS — Z880 Allergy status to penicillin: Secondary | ICD-10-CM | POA: Diagnosis not present

## 2022-08-21 DIAGNOSIS — M25611 Stiffness of right shoulder, not elsewhere classified: Secondary | ICD-10-CM | POA: Diagnosis not present

## 2022-08-21 DIAGNOSIS — Z79899 Other long term (current) drug therapy: Secondary | ICD-10-CM | POA: Diagnosis not present

## 2022-08-31 ENCOUNTER — Other Ambulatory Visit: Payer: Self-pay | Admitting: Family Medicine

## 2022-08-31 DIAGNOSIS — J302 Other seasonal allergic rhinitis: Secondary | ICD-10-CM

## 2022-09-06 ENCOUNTER — Other Ambulatory Visit: Payer: Self-pay | Admitting: Neurology

## 2022-09-06 DIAGNOSIS — M545 Low back pain, unspecified: Secondary | ICD-10-CM

## 2022-09-06 NOTE — Telephone Encounter (Signed)
Pt requesting refill on oxyCODONE-acetaminophen (PERCOCET) 10-325 MG tablet. Should be sent to [facility] [phone] -

## 2022-09-06 NOTE — Telephone Encounter (Signed)
Pt last seen on 05/23/22 "Continue Percocet will be renewed. "  Follow up visit scheduled with Dr.Sater 09/19/22   Rx last filled 08/09/22 #120 tablets (30 day supply)  Rx pending to be signed.

## 2022-09-07 MED ORDER — OXYCODONE-ACETAMINOPHEN 10-325 MG PO TABS
1.0000 | ORAL_TABLET | Freq: Four times a day (QID) | ORAL | 0 refills | Status: DC | PRN
Start: 2022-09-07 — End: 2022-10-07

## 2022-09-12 ENCOUNTER — Ambulatory Visit: Payer: Medicare HMO | Admitting: Family Medicine

## 2022-09-19 ENCOUNTER — Ambulatory Visit (INDEPENDENT_AMBULATORY_CARE_PROVIDER_SITE_OTHER): Payer: Medicare HMO | Admitting: Neurology

## 2022-09-19 ENCOUNTER — Encounter: Payer: Self-pay | Admitting: Neurology

## 2022-09-19 VITALS — BP 153/93 | Ht 65.0 in | Wt 221.0 lb

## 2022-09-19 DIAGNOSIS — M7552 Bursitis of left shoulder: Secondary | ICD-10-CM | POA: Diagnosis not present

## 2022-09-19 DIAGNOSIS — M545 Low back pain, unspecified: Secondary | ICD-10-CM | POA: Diagnosis not present

## 2022-09-19 DIAGNOSIS — G43009 Migraine without aura, not intractable, without status migrainosus: Secondary | ICD-10-CM | POA: Diagnosis not present

## 2022-09-19 DIAGNOSIS — Z79891 Long term (current) use of opiate analgesic: Secondary | ICD-10-CM | POA: Diagnosis not present

## 2022-09-19 DIAGNOSIS — M542 Cervicalgia: Secondary | ICD-10-CM

## 2022-09-19 DIAGNOSIS — M7551 Bursitis of right shoulder: Secondary | ICD-10-CM | POA: Diagnosis not present

## 2022-09-19 NOTE — Progress Notes (Signed)
t  [facility]  PATIENT: Kimberly Mann DOB: 1955-10-19  REFERRING CLINICIAN: Salli Real  HISTORY FROM: Patient   REASON FOR VISIT: Left shoulder and neck pain; back pain   HISTORICAL  CHIEF COMPLAINT:  Chief Complaint  Patient presents with   Follow-up    Pt in room 11. Here for right knee pain,right shoulder pain follow up, pt said she is still having pain.Pt seen sport medicine provider was given injection in shoulder about 06/26/22.    HISTORY OF PRESENT ILLNESS:  Kimberly Mann is a 67 y.o. woman with neck, back, hip and shoulder pain.  Update 09/19/2022: She  experiences migraine headaches twice a week on average. Imitrex has not worked in the past.  She rarely takes a Geophysicist/field seismologist.  She is noting more shoulder pain, right > left.  Pain increases with use and arm elevation/rotation.   She felt the bursa injections helped more than the intra-articular injections.     She also has some LBP > hip pain.  .  She did well after her injections 4 months ago for about 3 months but pain is worsening again in back, hips .  The worst pain is in the hips, right > left  P pain worsens with standing or walking.   We have done trochanteric bursae injections several times at with benefit.  We also sometimes done trigger point in the piriformis muscles or the lower lumbar spinal muscles, usually with benefit for several months.  She also repots some right > left shoulder but pain is much better sill since subacromial bursa injections at the last visit.   She had a bone density test in 2021 showing osteopenia.   She does get steroid injection from [place] 3 times a year (usually 80 mg Depo-Medrol, sometimes just 40 mg) and also has had some steroid packs    the shots help her more than the steroid packs.  We discussed that we need to continue to try to limit the injections to just a few each year.  She has some depression but is doing better than earlier in the year.    She has  breast cancer,  She is done with RadRx and still is on tamoxifen.      MRI Imaging: MRI cervical spine 03/06/2017:  Shallow broad-based central protrusion at C4-5 slightly deforms the  cord but the central canal and foramina are open.    Mild right foraminal narrowing at C5-6 due to uncovertebral  spurring. The central canal and left foramen are open at this level.    Based on comparison with the report of the prior MRI, the appearance of the cervical spine is unchanged.   MRI brain 10/06/2019 [facility]):  This MRI of the brain with and without contrast with added attention to the internal auditory canals shows the following:   The 8th nerves and the internal auditory canals appear normal. Scattered T2/FLAIR hyperintense foci in the subcortical and deep white matter consistent with chronic microvascular ischemic change.  None of the foci appear to be acute.  There is a normal enhancement pattern and no acute findings.    REVIEW OF SYSTEMS:  Constitutional: No fevers, chills, sweats, or change in appetite Eyes: No visual changes, double vision, eye pain Ear, nose and throat: No hearing loss, ear pain, nasal congestion, sore throat Cardiovascular: No chest pain, palpitations Respiratory:  No shortness of breath at rest or with exertion.   No wheezes GastrointestinaI: No nausea, vomiting, diarrhea, abdominal pain, fecal  incontinence Genitourinary:  No dysuria, urinary retention or frequency.  No nocturia. Musculoskeletal: as above Integumentary: No rash, pruritus, skin lesions Neurological: as above Psychiatric: She has som depression since breast cancer dx.  Endocrine: No palpitations, diaphoresis, change in appetite, change in weigh or increased thirst  ALLERGIES: Allergies  Allergen Reactions   Morphine And Codeine Nausea And Vomiting and Swelling   Penicillins    Statins Other (See Comments)    Myalgias   Sulfa Antibiotics     HOME MEDICATIONS: Outpatient Medications Prior  to Visit  Medication Sig Dispense Refill   diclofenac sodium (VOLTAREN) 1 % GEL Apply 2 g topically 4 (four) times daily as needed. 200 g 5   fluconazole (DIFLUCAN) 150 MG tablet Take 1 tablet by mouth once daily 15 tablet 0   fluticasone (FLONASE) 50 MCG/ACT nasal spray Place 2 sprays into both nostrils daily. 16 g 3   hydrOXYzine (VISTARIL) 25 MG capsule Take 1 capsule (25 mg total) by mouth 3 (three) times daily as needed. 60 capsule 5   levocetirizine (XYZAL) 5 MG tablet TAKE 1 TABLET BY MOUTH ONCE DAILY IN THE EVENING 90 tablet 0   lidocaine (XYLOCAINE) 5 % ointment Apply 1 application. topically as needed. 35.44 g 5   Multiple Vitamins-Minerals (MULTIVITAMIN ADULT PO)      oxyCODONE-acetaminophen (PERCOCET) 10-325 MG tablet Take 1 tablet by mouth every 6 (six) hours as needed for pain. 120 tablet 0   pantoprazole (PROTONIX) 40 MG tablet Take 1 tablet (40 mg total) by mouth daily. 30 tablet 3   tamoxifen (NOLVADEX) 20 MG tablet Take 20 mg by mouth daily.     tiZANidine (ZANAFLEX) 4 MG tablet Take 1 tablet (4 mg total) by mouth every 6 (six) hours as needed for muscle spasms. 30 tablet 0   triamcinolone cream (KENALOG) 0.1 % Apply 1 Application topically 2 (two) times daily. 30 g 0   triamterene-hydrochlorothiazide (MAXZIDE-25) 37.5-25 MG tablet Take 1 tablet by mouth daily. Take 1/2 (one-half) tablet by mouth once daily 90 tablet 2   Vitamin D, Ergocalciferol, (DRISDOL) 1.25 MG (50000 UNIT) CAPS capsule Take 1 capsule by mouth once a week 12 capsule 0   No facility-administered medications prior to visit.    PAST MEDICAL HISTORY: Past Medical History:  Diagnosis Date   Anxiety    Cancer ([facility])    breast   Chronic back pain    takes oxycodone   Complication of anesthesia    Eczema    Environmental allergies    Headache    Hypertension    PONV (postoperative nausea and vomiting)    Vertigo     PAST SURGICAL HISTORY: Past Surgical History:  Procedure Laterality Date   BREAST  REDUCTION SURGERY Left 11/20/2017   Procedure: left breast reduction for symmetry and liposuction bilateral lateral breasts;  Surgeon: Peggye Form, DO;  Location: [facility];  Service: Plastics;  Laterality: Left;   BREAST SURGERY     rt breast lumpectomy   HYSTEROSCOPY WITH D & C N/A 06/05/2021   Procedure: DILATATION AND CURETTAGE /HYSTEROSCOPY/MYOSURE RESECTION OF UTERINE FIBROIDS;  Surgeon: Willodean Rosenthal, MD;  Location: [facility] [place];  Service: Gynecology;  Laterality: N/A;   LIPOSUCTION     TUBAL LIGATION      FAMILY HISTORY: Family History  Problem Relation Age of Onset   Hypertension Mother     SOCIAL HISTORY:  Social History   Socioeconomic History   Marital status: Legally Separated  Spouse name: Not on file   Number of children: Not on file   Years of education: Not on file   Highest education level: Not on file  Occupational History   Not on file  Tobacco Use   Smoking status: Former    Types: Cigarettes    Quit date: 05/17/1974    Years since quitting: 48.3   Smokeless tobacco: Never  Vaping Use   Vaping Use: Never used  Substance and Sexual Activity   Alcohol use: Not Currently    Alcohol/week: 7.0 standard drinks of alcohol    Types: 7 Standard drinks or equivalent per week   Drug use: No    Types: Marijuana    Comment: Last use 06/04/21   Sexual activity: Not on file  Other Topics Concern   Not on file  Social History Narrative   Not on file   Social Determinants of Health   Financial Resource Strain: Low Risk  (02/01/2021)   Overall Financial Resource Strain (CARDIA)    Difficulty of Paying Living Expenses: Not hard at all  Food Insecurity: No Food Insecurity (07/23/2022)   Hunger Vital Sign    Worried About Running Out of Food in the Last Year: Never true    Ran Out of Food in the Last Year: Never true  Transportation Needs: No Transportation Needs (07/23/2022)   PRAPARE - Scientist, research (physical sciences) (Medical): No    Lack of Transportation (Non-Medical): No  Physical Activity: Inactive (02/01/2021)   Exercise Vital Sign    Days of Exercise per Week: 0 days    Minutes of Exercise per Session: 0 min  Stress: No Stress Concern Present (02/01/2021)   [employer] of Occupational Health - Occupational Stress Questionnaire    Feeling of Stress : Not at all  Social Connections: Moderately Isolated (02/01/2021)   Social Connection and Isolation Panel [NHANES]    Frequency of Communication with Friends and Family: More than three times a week    Frequency of Social Gatherings with Friends and Family: More than three times a week    Attends Religious Services: More than 4 times per year    Active Member of [employer] or Organizations: No    Attends Banker Meetings: Never    Marital Status: Separated  Intimate Partner Violence: Not At Risk (07/23/2022)   Humiliation, Afraid, Rape, and Kick questionnaire    Fear of Current or Ex-Partner: No    Emotionally Abused: No    Physically Abused: No    Sexually Abused: No     PHYSICAL EXAM  Vitals:   09/19/22 1028  BP: (!) 153/93  Weight: 221 lb (100.2 kg)  Height: 5\' 5"  (1.651 m)    Body mass index is 36.78 kg/m.   General: The patient is well-developed and well-nourished and in no acute distress   Musculoskeletal:   She has very mild tenderness over the subacromial bursae R >>L  with reduced range of motion in the right shoulder.  Also has milder lower lumbar paraspinal muscles tenderness and over the bilateral trochanteric bursae.  Neurologic Exam  Mental status: The patient is alert and oriented x 3 at the time of the examination. The patient has apparent normal recent and remote memory, with an apparently normal attention span and concentration ability.    Cranial nerves:    Facial strength is normal.  Trapezius strength is normal.  No dysarthria is noted.    No obvious hearing deficits are noted.  Motor:   Muscle bulk and tone are normal. Strength is 5/5 in the arms or legs.   Gait and station: Station is normal.  She has an arthritic gait.  Tandem gait is mildly wide.. Romberg negative  DTRs:   Reflexes are normal and symmetric in the arms and legs.     ASSESSMENT AND PLAN   1. Subacromial bursitis of both shoulders   2. Chronic prescription opiate use   3. Neck pain   4. Back pain, lumbosacral   5. Migraine without aura and without status migrainosus, not intractable      1.   Injection of bilateral subacromial bursa with 30 mg Depo-Medrol in 2.5 cc lidocaine into each shoulder using sterile technique..  She tolerated the injection well and pain was better afterwards. 2.     TPI cervical paraspinal muscle TPI with 20 mg Depo-Medrol in 3 cc Marcaine using sterile technique.   3.  Continue Percocet will be renewed.  [place] controlled substance database was reviewed and she is compliant..  A postdated prescription has been sent to the pharmacy. Urine drug screen was fine earlier this year.. 4.     She has osteopenia (2021 DEXA)    We have been averaging 1 steroid injection every 4 months and she reports she may get steroid 1 or 2 other times a year.   Advised to discuss further with PCP 5.   Stay active and exercise as tolerated. 6. .   She will return to see me in 4 months, sooner if she  has new or worsening neurologic symptoms.    Amelya Mabry A. Epimenio Foot, MD, PhD 09/19/2022, 10:56 AM Certified in Neurology, Clinical Neurophysiology, Sleep Medicine, Pain Medicine and Neuroimaging  [facility] [address] [place], [place] [address] [phone]

## 2022-09-20 ENCOUNTER — Other Ambulatory Visit: Payer: Self-pay | Admitting: Neurology

## 2022-09-20 ENCOUNTER — Ambulatory Visit: Payer: Medicare HMO | Admitting: Family Medicine

## 2022-09-20 NOTE — Telephone Encounter (Signed)
Pt requesting a refill on fluconazole (DIFLUCAN) 150 MG tablet. Should be sent to  [facility] [phone]

## 2022-09-24 MED ORDER — FLUCONAZOLE 150 MG PO TABS: 150.0000 mg | ORAL_TABLET | Freq: Every day | ORAL | 0 refills | Status: DC

## 2022-09-24 NOTE — Telephone Encounter (Signed)
Pt last seen on 09/19/22 for migraines  Follow up scheduled on 02/27/23  You filled diflucan 150 mg tablets on 07/01/22 #15 tablets   Rx pending to be signed

## 2022-10-03 DIAGNOSIS — M6281 Muscle weakness (generalized): Secondary | ICD-10-CM | POA: Diagnosis not present

## 2022-10-03 DIAGNOSIS — Z79899 Other long term (current) drug therapy: Secondary | ICD-10-CM | POA: Diagnosis not present

## 2022-10-03 DIAGNOSIS — M25511 Pain in right shoulder: Secondary | ICD-10-CM | POA: Diagnosis not present

## 2022-10-03 DIAGNOSIS — Z885 Allergy status to narcotic agent status: Secondary | ICD-10-CM | POA: Diagnosis not present

## 2022-10-03 DIAGNOSIS — M25611 Stiffness of right shoulder, not elsewhere classified: Secondary | ICD-10-CM | POA: Diagnosis not present

## 2022-10-03 DIAGNOSIS — M19011 Primary osteoarthritis, right shoulder: Secondary | ICD-10-CM | POA: Diagnosis not present

## 2022-10-03 DIAGNOSIS — Z880 Allergy status to penicillin: Secondary | ICD-10-CM | POA: Diagnosis not present

## 2022-10-03 DIAGNOSIS — R29898 Other symptoms and signs involving the musculoskeletal system: Secondary | ICD-10-CM | POA: Diagnosis not present

## 2022-10-03 DIAGNOSIS — Z7409 Other reduced mobility: Secondary | ICD-10-CM | POA: Diagnosis not present

## 2022-10-07 ENCOUNTER — Other Ambulatory Visit: Payer: Self-pay | Admitting: Neurology

## 2022-10-07 DIAGNOSIS — M545 Low back pain, unspecified: Secondary | ICD-10-CM

## 2022-10-07 MED ORDER — OXYCODONE-ACETAMINOPHEN 10-325 MG PO TABS
1.0000 | ORAL_TABLET | Freq: Four times a day (QID) | ORAL | 0 refills | Status: DC | PRN
Start: 2022-10-07 — End: 2022-10-28

## 2022-10-07 NOTE — Telephone Encounter (Addendum)
Pt last seen on 09/19/22 Follow up scheduled on 02/27/23 Last filled on 09/07/22 #120 tablets (30 day supply) Rx pending to be signed

## 2022-10-07 NOTE — Telephone Encounter (Signed)
oxyCODONE-acetaminophen (PERCOCET) 10-325 MG tablet   Please refill for patient. She said Dr. Epimenio Foot in Dr but listed as refilled by Dr. Lucia Gaskins in May.

## 2022-10-07 NOTE — Addendum Note (Signed)
Addended by: Aura Camps on: 10/07/2022 08:55 AM   Modules accepted: Orders

## 2022-10-09 DIAGNOSIS — Z08 Encounter for follow-up examination after completed treatment for malignant neoplasm: Secondary | ICD-10-CM | POA: Diagnosis not present

## 2022-10-09 DIAGNOSIS — C50511 Malignant neoplasm of lower-outer quadrant of right female breast: Secondary | ICD-10-CM | POA: Diagnosis not present

## 2022-10-09 DIAGNOSIS — Z170 Estrogen receptor positive status [ER+]: Secondary | ICD-10-CM | POA: Diagnosis not present

## 2022-10-13 ENCOUNTER — Emergency Department (HOSPITAL_BASED_OUTPATIENT_CLINIC_OR_DEPARTMENT_OTHER)
Admission: EM | Admit: 2022-10-13 | Discharge: 2022-10-13 | Disposition: A | Discharge status: Home / Self Care | Payer: Medicare HMO | Attending: Emergency Medicine | Admitting: Emergency Medicine

## 2022-10-13 ENCOUNTER — Encounter (HOSPITAL_BASED_OUTPATIENT_CLINIC_OR_DEPARTMENT_OTHER): Payer: Self-pay | Admitting: Emergency Medicine

## 2022-10-13 ENCOUNTER — Other Ambulatory Visit: Payer: Self-pay

## 2022-10-13 ENCOUNTER — Emergency Department (HOSPITAL_BASED_OUTPATIENT_CLINIC_OR_DEPARTMENT_OTHER): Payer: Medicare HMO

## 2022-10-13 DIAGNOSIS — I10 Essential (primary) hypertension: Secondary | ICD-10-CM | POA: Diagnosis not present

## 2022-10-13 DIAGNOSIS — R1033 Periumbilical pain: Secondary | ICD-10-CM | POA: Insufficient documentation

## 2022-10-13 DIAGNOSIS — Z6836 Body mass index (BMI) 36.0-36.9, adult: Secondary | ICD-10-CM | POA: Diagnosis not present

## 2022-10-13 DIAGNOSIS — R109 Unspecified abdominal pain: Secondary | ICD-10-CM

## 2022-10-13 DIAGNOSIS — R1031 Right lower quadrant pain: Secondary | ICD-10-CM | POA: Insufficient documentation

## 2022-10-13 DIAGNOSIS — K5730 Diverticulosis of large intestine without perforation or abscess without bleeding: Secondary | ICD-10-CM | POA: Diagnosis not present

## 2022-10-13 DIAGNOSIS — K7689 Other specified diseases of liver: Secondary | ICD-10-CM | POA: Diagnosis not present

## 2022-10-13 DIAGNOSIS — R1084 Generalized abdominal pain: Secondary | ICD-10-CM | POA: Diagnosis not present

## 2022-10-13 DIAGNOSIS — R110 Nausea: Secondary | ICD-10-CM | POA: Diagnosis not present

## 2022-10-13 LAB — COMPREHENSIVE METABOLIC PANEL
ALT: 22 U/L (ref 0–44)
AST: 35 U/L (ref 15–41)
Albumin: 3.8 g/dL (ref 3.5–5.0)
Alkaline Phosphatase: 59 U/L (ref 38–126)
Anion gap: 10 (ref 5–15)
BUN: 16 mg/dL (ref 8–23)
CO2: 23 mmol/L (ref 22–32)
Calcium: 8.6 mg/dL — ABNORMAL LOW (ref 8.9–10.3)
Chloride: 106 mmol/L (ref 98–111)
Creatinine, Ser: 0.91 mg/dL (ref 0.44–1.00)
GFR, Estimated: >60 mL/min (ref >60)
Glucose, Bld: 106 mg/dL — ABNORMAL HIGH (ref 70–99)
Potassium: 3.8 mmol/L (ref 3.5–5.1)
Sodium: 139 mmol/L (ref 135–145)
Total Bilirubin: 0.7 mg/dL (ref 0.3–1.2)
Total Protein: 7.5 g/dL (ref 6.5–8.1)

## 2022-10-13 LAB — CBC WITH DIFFERENTIAL/PLATELET
Abs Immature Granulocytes: 0.02 10*3/uL (ref 0.00–0.07)
Basophils Absolute: 0 10*3/uL (ref 0.0–0.1)
Basophils Relative: 1 %
Eosinophils Absolute: 0.2 10*3/uL (ref 0.0–0.5)
Eosinophils Relative: 3 %
HCT: 42.4 % (ref 36.0–46.0)
Hemoglobin: 13.7 g/dL (ref 12.0–15.0)
Immature Granulocytes: 0 %
Lymphocytes Relative: 33 %
Lymphs Abs: 2 10*3/uL (ref 0.7–4.0)
MCH: 29.9 pg (ref 26.0–34.0)
MCHC: 32.3 g/dL (ref 30.0–36.0)
MCV: 92.6 fL (ref 80.0–100.0)
Monocytes Absolute: 0.4 10*3/uL (ref 0.1–1.0)
Monocytes Relative: 6 %
Neutro Abs: 3.5 10*3/uL (ref 1.7–7.7)
Neutrophils Relative %: 57 %
Platelets: 217 10*3/uL (ref 150–400)
RBC: 4.58 MIL/uL (ref 3.87–5.11)
RDW: 13.8 % (ref 11.5–15.5)
WBC: 6.1 10*3/uL (ref 4.0–10.5)
nRBC: 0 % (ref 0.0–0.2)

## 2022-10-13 LAB — URINALYSIS, ROUTINE W REFLEX MICROSCOPIC
Bilirubin Urine: NEGATIVE
Glucose, UA: NEGATIVE mg/dL
Hgb urine dipstick: NEGATIVE
Ketones, ur: NEGATIVE mg/dL
Leukocytes,Ua: NEGATIVE
Nitrite: NEGATIVE
Protein, ur: NEGATIVE mg/dL
Specific Gravity, Urine: 1.025 (ref 1.005–1.030)
pH: 6 (ref 5.0–8.0)

## 2022-10-13 LAB — LIPASE, BLOOD: Lipase: 35 U/L (ref 11–51)

## 2022-10-13 MED ORDER — IOHEXOL 300 MG/ML  SOLN
100.0000 mL | Freq: Once | INTRAMUSCULAR | Status: AC | PRN
  Administered 2022-10-13: 100 mL via INTRAVENOUS

## 2022-10-13 MED ORDER — HYDROCODONE-ACETAMINOPHEN 5-325 MG PO TABS
1.0000 | ORAL_TABLET | Freq: Once | ORAL | Status: AC
  Administered 2022-10-13: 1 via ORAL
  Filled 2022-10-13: qty 1

## 2022-10-13 MED ORDER — FENTANYL CITRATE PF 50 MCG/ML IJ SOSY: 50.0000 ug | PREFILLED_SYRINGE | Freq: Once | INTRAMUSCULAR | Status: DC

## 2022-10-13 MED ORDER — CYCLOBENZAPRINE HCL 10 MG PO TABS: 10.0000 mg | ORAL_TABLET | Freq: Two times a day (BID) | ORAL | 0 refills | Status: DC | PRN

## 2022-10-13 NOTE — ED Provider Notes (Signed)
[place] [facility] AT [facility] Provider Note   CSN: [id number] Arrival date & time: 10/13/22  1455     History  Chief Complaint  Patient presents with   Abdominal Pain    Kimberly Mann is a 67 y.o. female.  Patient here with right flank pain radiating into her right lower abdomen/umbilical area for the last 2 weeks.  History of liposuction surgery.  History of hypertension.  She denies any chest pain or shortness of breath.  No history of kidney stones.  Nothing makes it better but movement makes it worse.  She thinks it could be a muscular process.  Denies any weakness, numbness.  Denies any nausea or vomiting or diarrhea.  The history is provided by the patient.       Home Medications Prior to Admission medications   Medication Sig Start Date End Date Taking? Authorizing Provider  cyclobenzaprine (FLEXERIL) 10 MG tablet Take 1 tablet (10 mg total) by mouth 2 (two) times daily as needed for muscle spasms. 10/13/22  Yes Nolton Denis, DO  diclofenac sodium (VOLTAREN) 1 % GEL Apply 2 g topically 4 (four) times daily as needed. 04/16/18   Sater, Pearletha Furl, MD  fluconazole (DIFLUCAN) 150 MG tablet Take 1 tablet (150 mg total) by mouth daily. 09/24/22   Sater, Pearletha Furl, MD  fluticasone (FLONASE) 50 MCG/ACT nasal spray Place 2 sprays into both nostrils daily. 12/14/21   Sharlene Dory, DO  hydrOXYzine (VISTARIL) 25 MG capsule Take 1 capsule (25 mg total) by mouth 3 (three) times daily as needed. 08/28/21   Sater, Pearletha Furl, MD  levocetirizine (XYZAL) 5 MG tablet TAKE 1 TABLET BY MOUTH ONCE DAILY IN THE EVENING 09/02/22   Wendling, Jilda Roche, DO  lidocaine (XYLOCAINE) 5 % ointment Apply 1 application. topically as needed. 08/28/21   Sater, Pearletha Furl, MD  Multiple Vitamins-Minerals (MULTIVITAMIN ADULT PO)  03/29/15   [provider]  oxyCODONE-acetaminophen (PERCOCET) 10-325 MG tablet Take 1 tablet by mouth every 6 (six) hours as needed for  pain. 10/07/22   Sater, Pearletha Furl, MD  pantoprazole (PROTONIX) 40 MG tablet Take 1 tablet (40 mg total) by mouth daily. 06/12/21   Sharlene Dory, DO  tamoxifen (NOLVADEX) 20 MG tablet Take 20 mg by mouth daily.    [provider]  triamcinolone cream (KENALOG) 0.1 % Apply 1 Application topically 2 (two) times daily. 06/18/22   Sharlene Dory, DO  triamterene-hydrochlorothiazide (MAXZIDE-25) 37.5-25 MG tablet Take 1 tablet by mouth daily. Take 1/2 (one-half) tablet by mouth once daily 07/17/22   Sharlene Dory, DO  Vitamin D, Ergocalciferol, (DRISDOL) 1.25 MG (50000 UNIT) CAPS capsule Take 1 capsule by mouth once a week 08/21/22   Sharlene Dory, DO      Allergies    Morphine and codeine, Penicillins, Statins, and Sulfa antibiotics    Review of Systems   Review of Systems  Physical Exam Updated Vital Signs BP (!) 173/90 (BP Location: Right Arm)   Pulse 95   Temp 97.8 F (36.6 C) (Oral)   Resp 18   Ht 5\' 5"  (1.651 m)   Wt 100.2 kg   SpO2 100%   BMI 36.76 kg/m  Physical Exam Vitals and nursing note reviewed.  Constitutional:      General: She is not in acute distress.    Appearance: She is well-developed. She is not ill-appearing.  HENT:     Head: Normocephalic and atraumatic.     Mouth/Throat:  Mouth: Mucous membranes are moist.  Eyes:     Extraocular Movements: Extraocular movements intact.     Conjunctiva/sclera: Conjunctivae normal.  Cardiovascular:     Rate and Rhythm: Normal rate and regular rhythm.     Heart sounds: Normal heart sounds. No murmur heard. Pulmonary:     Effort: Pulmonary effort is normal. No respiratory distress.     Breath sounds: Normal breath sounds.  Abdominal:     Palpations: Abdomen is soft.     Tenderness: There is abdominal tenderness in the right lower quadrant, periumbilical area and suprapubic area. There is right CVA tenderness.     Hernia: No hernia is present.  Musculoskeletal:         General: No swelling.     Cervical back: Neck supple.  Skin:    General: Skin is warm and dry.     Capillary Refill: Capillary refill takes less than 2 seconds.  Neurological:     Mental Status: She is alert.  Psychiatric:        Mood and Affect: Mood normal.     ED Results / Procedures / Treatments   Labs (all labs ordered are listed, but only abnormal results are displayed) Labs Reviewed  COMPREHENSIVE METABOLIC PANEL - Abnormal; Notable for the following components:      Result Value   Glucose, Bld 106 (*)    Calcium 8.6 (*)    All other components within normal limits  CBC WITH DIFFERENTIAL/PLATELET  LIPASE, BLOOD  URINALYSIS, ROUTINE W REFLEX MICROSCOPIC    EKG None  Radiology CT ABDOMEN PELVIS W CONTRAST  Result Date: 10/13/2022 CLINICAL DATA:  Abdominal pain EXAM: CT ABDOMEN AND PELVIS WITH CONTRAST TECHNIQUE: Multidetector CT imaging of the abdomen and pelvis was performed using the standard protocol following bolus administration of intravenous contrast. RADIATION DOSE REDUCTION: This exam was performed according to the departmental dose-optimization program which includes automated exposure control, adjustment of the mA and/or kV according to patient size and/or use of iterative reconstruction technique. CONTRAST:  OMNIPAQUE IOHEXOL 300 MG/ML  SOLN COMPARISON:  CT abdomen and pelvis 07/21/2009 FINDINGS: Lower chest: No acute abnormality. Hepatobiliary: There are 2 subcentimeter hypodensities in the liver which are too small to characterize, possibly cysts or hemangiomas. Gallbladder and bile ducts are within normal limits. Pancreas: Unremarkable. No pancreatic ductal dilatation or surrounding inflammatory changes. Spleen: Normal in size without focal abnormality. Adrenals/Urinary Tract: Bilateral adrenal nodularity is unchanged, likely adenomas. Kidneys and bladder are within normal limits. Stomach/Bowel: Stomach is within normal limits. Appendix appears normal. No  evidence of bowel wall thickening, distention, or inflammatory changes. There is sigmoid colon diverticulosis. Vascular/Lymphatic: Aortic atherosclerosis. No enlarged abdominal or pelvic lymph nodes. Reproductive: Uterus is enlarged and lobulated compatible with fibroid change. Largest fibroid is seen posteriorly measuring at least 4 cm. The ovaries are not well identified. Other: There is a small fat containing umbilical hernia. There is scarring in the lower anterior abdominal wall. No abdominopelvic ascites. Musculoskeletal: Multilevel degenerative changes affect the spine. IMPRESSION: 1. No acute localizing process in the abdomen or pelvis. 2. Colonic diverticulosis without evidence for diverticulitis. 3. Fibroid uterus. 4. Stable bilateral adrenal nodularity, likely adenomas. 5. Subcentimeter hypodensities in the liver are too small to characterize, possibly cysts or hemangiomas. Aortic Atherosclerosis (ICD10-I70.0). Electronically Signed   By: Darliss Cheney M.D.   On: 10/13/2022 17:01    Procedures Procedures    Medications Ordered in ED Medications  HYDROcodone-acetaminophen (NORCO/VICODIN) 5-325 MG per tablet 1 tablet (1 tablet  Oral Given 10/13/22 1544)  iohexol (OMNIPAQUE) 300 MG/ML solution 100 mL (100 mLs Intravenous Contrast Given 10/13/22 1625)    ED Course/ Medical Decision Making/ A&P                             Medical Decision Making Amount and/or Complexity of Data Reviewed Labs: ordered. Radiology: ordered.  Risk Prescription drug management.   Jalayia Elizardo is here with abdominal pain.  History of hypertension chronic back pain.  Unremarkable vitals.  No fever.  Differential diagnosis possibly musculoskeletal process versus less likely kidney stone, bowel obstruction, appendicitis, UTI, diverticulitis.  Will get CBC, CMP, lipase, urinalysis and CT scan abdomen pelvis.  Will give a dose IV fentanyl.  Per my review and interpretation labs no significant anemia or  electrolyte abnormality or kidney injury or leukocytosis.  CT scan fairly unremarkable.  Uterine fibroid.  Overall suspect likely MSK pain.  Recommend Tylenol, Flexeril follow-up with primary care doctor.  This chart was dictated using voice recognition software.  Despite best efforts to proofread,  errors can occur which can change the documentation meaning.         Final Clinical Impression(s) / ED Diagnoses Final diagnoses:  Abdominal pain, unspecified abdominal location    Rx / DC Orders ED Discharge Orders          Ordered    cyclobenzaprine (FLEXERIL) 10 MG tablet  2 times daily PRN        10/13/22 1718              Virgina Norfolk, DO 10/13/22 1808

## 2022-10-13 NOTE — ED Triage Notes (Signed)
Pt reports abd pain around umbilicus and radiating to RT side x 2 wks; +nausea

## 2022-10-14 ENCOUNTER — Telehealth: Payer: Self-pay

## 2022-10-14 NOTE — Telephone Encounter (Signed)
Patient called this morning and scheduled for follow up in July.   Patients son calling back at lunch after hours line and states patient is having sever stomach pains. Patient went to ER last night  and was told she has fibroids that were diagnosed by CT scan. Patient reports pain is 15/10. Denies any fever - was given script for muscle relaxer but no pain meds.  After hours nurse told them to go back to ED. Patient just wants pain medication until she can see Dr. Erin Fulling. Armandina Stammer RN

## 2022-10-18 DIAGNOSIS — M19011 Primary osteoarthritis, right shoulder: Secondary | ICD-10-CM | POA: Diagnosis not present

## 2022-10-18 DIAGNOSIS — Z79899 Other long term (current) drug therapy: Secondary | ICD-10-CM | POA: Diagnosis not present

## 2022-10-18 DIAGNOSIS — Z885 Allergy status to narcotic agent status: Secondary | ICD-10-CM | POA: Diagnosis not present

## 2022-10-18 DIAGNOSIS — Z880 Allergy status to penicillin: Secondary | ICD-10-CM | POA: Diagnosis not present

## 2022-10-18 DIAGNOSIS — M25511 Pain in right shoulder: Secondary | ICD-10-CM | POA: Diagnosis not present

## 2022-10-18 DIAGNOSIS — Z7409 Other reduced mobility: Secondary | ICD-10-CM | POA: Diagnosis not present

## 2022-10-18 DIAGNOSIS — M6281 Muscle weakness (generalized): Secondary | ICD-10-CM | POA: Diagnosis not present

## 2022-10-18 DIAGNOSIS — M25611 Stiffness of right shoulder, not elsewhere classified: Secondary | ICD-10-CM | POA: Diagnosis not present

## 2022-10-18 DIAGNOSIS — R29898 Other symptoms and signs involving the musculoskeletal system: Secondary | ICD-10-CM | POA: Diagnosis not present

## 2022-10-23 ENCOUNTER — Ambulatory Visit: Payer: Medicare HMO | Admitting: Obstetrics & Gynecology

## 2022-10-23 ENCOUNTER — Encounter: Payer: Self-pay | Admitting: Obstetrics & Gynecology

## 2022-10-23 VITALS — BP 163/94 | HR 95 | Ht 65.0 in | Wt 224.0 lb

## 2022-10-23 DIAGNOSIS — R110 Nausea: Secondary | ICD-10-CM | POA: Diagnosis not present

## 2022-10-23 DIAGNOSIS — R1084 Generalized abdominal pain: Secondary | ICD-10-CM | POA: Diagnosis not present

## 2022-10-23 LAB — POCT URINALYSIS DIPSTICK
Bilirubin, UA: NEGATIVE
Blood, UA: NEGATIVE
Glucose, UA: NEGATIVE
Ketones, UA: NEGATIVE
Leukocytes, UA: NEGATIVE
Nitrite, UA: NEGATIVE
Protein, UA: NEGATIVE
Spec Grav, UA: 1.015 (ref 1.010–1.025)
Urobilinogen, UA: 0.2 E.U./dL
pH, UA: 5 (ref 5.0–8.0)

## 2022-10-23 MED ORDER — HYDROCODONE-ACETAMINOPHEN 5-325 MG PO TABS
1.0000 | ORAL_TABLET | Freq: Four times a day (QID) | ORAL | 0 refills | Status: DC | PRN
Start: 2022-10-23 — End: 2022-10-23

## 2022-10-23 MED ORDER — HYDROCODONE-ACETAMINOPHEN 5-325 MG PO TABS
1.0000 | ORAL_TABLET | Freq: Four times a day (QID) | ORAL | 0 refills | Status: DC | PRN
Start: 2022-10-23 — End: 2023-02-27

## 2022-10-23 MED ORDER — ONDANSETRON 4 MG PO TBDP
4.0000 mg | ORAL_TABLET | Freq: Four times a day (QID) | ORAL | 0 refills | Status: DC | PRN
Start: 2022-10-23 — End: 2023-02-12

## 2022-10-23 NOTE — Progress Notes (Signed)
Pt stated pain is a 12 on a scale of 1-10

## 2022-10-23 NOTE — Progress Notes (Signed)
History:  67 y.o. G1P1000 here today for pain in her abd. The pain is severe and became worse 1 1/2 weeks ago when pt was seen in the ED. All of her labs and images were WNL. She reports nausea and emesis. Pt uses THC. Does not smoke Tob.  Pt was seen in the ED and was told that her pain was related to fibroids that were noted on the imaging. She reports that she cannot tolerate this pain.    The following portions of the patient's history were reviewed and updated as appropriate: allergies, current medications, past family history, past medical history, past social history, past surgical history and problem list.  Review of Systems:  Pertinent items are noted in HPI.    Objective:  Physical Exam Blood pressure (!) 163/94, pulse 95, height 5\' 5"  (1.651 m), weight 224 lb (101.6 kg).  CONSTITUTIONAL: Well-developed, well-nourished female in no acute distress.  HENT:  Normocephalic, atraumatic EYES: Conjunctivae and EOM are normal. No scleral icterus.  NECK: Normal range of motion SKIN: Skin is warm and dry. No rash noted. Not diaphoretic.No pallor. NEUROLGIC: Alert and oriented to person, place, and time. Normal coordination.  Abd: Soft, Diffusely tender. No rebound and no guarding. No distention noted.  Pelvic: no adnexal tenderness. No palpable masses.   Labs and Imaging CT ABDOMEN PELVIS W CONTRAST  Result Date: 10/13/2022 CLINICAL DATA:  Abdominal pain EXAM: CT ABDOMEN AND PELVIS WITH CONTRAST TECHNIQUE: Multidetector CT imaging of the abdomen and pelvis was performed using the standard protocol following bolus administration of intravenous contrast. RADIATION DOSE REDUCTION: This exam was performed according to the departmental dose-optimization program which includes automated exposure control, adjustment of the mA and/or kV according to patient size and/or use of iterative reconstruction technique. CONTRAST:  OMNIPAQUE IOHEXOL 300 MG/ML  SOLN COMPARISON:  CT abdomen and pelvis  07/21/2009 FINDINGS: Lower chest: No acute abnormality. Hepatobiliary: There are 2 subcentimeter hypodensities in the liver which are too small to characterize, possibly cysts or hemangiomas. Gallbladder and bile ducts are within normal limits. Pancreas: Unremarkable. No pancreatic ductal dilatation or surrounding inflammatory changes. Spleen: Normal in size without focal abnormality. Adrenals/Urinary Tract: Bilateral adrenal nodularity is unchanged, likely adenomas. Kidneys and bladder are within normal limits. Stomach/Bowel: Stomach is within normal limits. Appendix appears normal. No evidence of bowel wall thickening, distention, or inflammatory changes. There is sigmoid colon diverticulosis. Vascular/Lymphatic: Aortic atherosclerosis. No enlarged abdominal or pelvic lymph nodes. Reproductive: Uterus is enlarged and lobulated compatible with fibroid change. Largest fibroid is seen posteriorly measuring at least 4 cm. The ovaries are not well identified. Other: There is a small fat containing umbilical hernia. There is scarring in the lower anterior abdominal wall. No abdominopelvic ascites. Musculoskeletal: Multilevel degenerative changes affect the spine. IMPRESSION: 1. No acute localizing process in the abdomen or pelvis. 2. Colonic diverticulosis without evidence for diverticulitis. 3. Fibroid uterus. 4. Stable bilateral adrenal nodularity, likely adenomas. 5. Subcentimeter hypodensities in the liver are too small to characterize, possibly cysts or hemangiomas. Aortic Atherosclerosis (ICD10-I70.0). Electronically Signed   By: Darliss Cheney M.D.   On: 10/13/2022 17:01    Assessment & Plan:  Glorene was seen today for follow-up.  Diagnoses and all orders for this visit:  Generalized abdominal pain -     POCT Urinalysis Dipstick -     US ABDOMEN LIMITED RUQ (LIVER/GB); Future -     Discontinue: HYDROcodone-acetaminophen (NORCO/VICODIN) 5-325 MG tablet; Take 1 tablet by mouth every 6 (six) hours as  needed. -     ondansetron (ZOFRAN-ODT) 4 MG disintegrating tablet; Take 1 tablet (4 mg total) by mouth every 6 (six) hours as needed for nausea. -     HYDROcodone-acetaminophen (NORCO/VICODIN) 5-325 MG tablet; Take 1 tablet by mouth every 6 (six) hours as needed.  Nausea -     US ABDOMEN LIMITED RUQ (LIVER/GB); Future   Addendum: After review of the imagining. The pt was referred to GI for further eval.    L. Harraway-Smith, M.D., [place] [place]

## 2022-10-24 ENCOUNTER — Ambulatory Visit (HOSPITAL_BASED_OUTPATIENT_CLINIC_OR_DEPARTMENT_OTHER)
Admission: RE | Admit: 2022-10-24 | Discharge: 2022-10-24 | Disposition: A | Discharge status: Home / Self Care | Payer: Medicare HMO | Source: Ambulatory Visit | Attending: Obstetrics & Gynecology | Admitting: Obstetrics & Gynecology

## 2022-10-24 DIAGNOSIS — R1084 Generalized abdominal pain: Secondary | ICD-10-CM | POA: Insufficient documentation

## 2022-10-24 DIAGNOSIS — M25511 Pain in right shoulder: Secondary | ICD-10-CM | POA: Diagnosis not present

## 2022-10-24 DIAGNOSIS — R110 Nausea: Secondary | ICD-10-CM | POA: Diagnosis not present

## 2022-10-24 DIAGNOSIS — Z7409 Other reduced mobility: Secondary | ICD-10-CM | POA: Diagnosis not present

## 2022-10-24 DIAGNOSIS — R1011 Right upper quadrant pain: Secondary | ICD-10-CM | POA: Diagnosis not present

## 2022-10-24 DIAGNOSIS — M25611 Stiffness of right shoulder, not elsewhere classified: Secondary | ICD-10-CM | POA: Diagnosis not present

## 2022-10-24 DIAGNOSIS — Z880 Allergy status to penicillin: Secondary | ICD-10-CM | POA: Diagnosis not present

## 2022-10-24 DIAGNOSIS — K838 Other specified diseases of biliary tract: Secondary | ICD-10-CM | POA: Diagnosis not present

## 2022-10-24 DIAGNOSIS — R29898 Other symptoms and signs involving the musculoskeletal system: Secondary | ICD-10-CM | POA: Diagnosis not present

## 2022-10-24 DIAGNOSIS — M6281 Muscle weakness (generalized): Secondary | ICD-10-CM | POA: Diagnosis not present

## 2022-10-24 DIAGNOSIS — Z885 Allergy status to narcotic agent status: Secondary | ICD-10-CM | POA: Diagnosis not present

## 2022-10-24 DIAGNOSIS — Z79899 Other long term (current) drug therapy: Secondary | ICD-10-CM | POA: Diagnosis not present

## 2022-10-24 DIAGNOSIS — M19011 Primary osteoarthritis, right shoulder: Secondary | ICD-10-CM | POA: Diagnosis not present

## 2022-10-25 DIAGNOSIS — C50911 Malignant neoplasm of unspecified site of right female breast: Secondary | ICD-10-CM | POA: Diagnosis not present

## 2022-10-25 DIAGNOSIS — Z1231 Encounter for screening mammogram for malignant neoplasm of breast: Secondary | ICD-10-CM | POA: Diagnosis not present

## 2022-10-25 DIAGNOSIS — Z170 Estrogen receptor positive status [ER+]: Secondary | ICD-10-CM | POA: Diagnosis not present

## 2022-10-28 ENCOUNTER — Telehealth: Payer: Self-pay

## 2022-10-28 ENCOUNTER — Other Ambulatory Visit: Payer: Self-pay | Admitting: Neurology

## 2022-10-28 DIAGNOSIS — M545 Low back pain, unspecified: Secondary | ICD-10-CM

## 2022-10-28 MED ORDER — OXYCODONE-ACETAMINOPHEN 10-325 MG PO TABS
1.0000 | ORAL_TABLET | Freq: Four times a day (QID) | ORAL | 0 refills | Status: DC | PRN
Start: 2022-10-28 — End: 2022-11-28

## 2022-10-28 NOTE — Addendum Note (Signed)
Addended by: Guy Begin on: 10/28/2022 02:01 PM   Modules accepted: Orders

## 2022-10-28 NOTE — Telephone Encounter (Signed)
Pt is requesting a refill for oxyCODONE-acetaminophen (PERCOCET) 10-325 MG tablet . ? ?Pharmacy: [id number]  ? ?

## 2022-10-28 NOTE — Telephone Encounter (Signed)
Patient called requesting [place] results. A message was sent to the provider. Understanding was voiced. Kimberly Mann l Loyda Costin, CMA

## 2022-10-28 NOTE — Telephone Encounter (Addendum)
[place] drug registry checked last fill #[id number] on 10/07/2022.  RX pended.  Last seen 09-19-2022

## 2022-10-30 ENCOUNTER — Other Ambulatory Visit: Payer: Self-pay | Admitting: Obstetrics & Gynecology

## 2022-10-30 DIAGNOSIS — R1011 Right upper quadrant pain: Secondary | ICD-10-CM

## 2022-11-09 ENCOUNTER — Other Ambulatory Visit: Payer: Self-pay | Admitting: Family Medicine

## 2022-11-12 DIAGNOSIS — H40033 Anatomical narrow angle, bilateral: Secondary | ICD-10-CM | POA: Diagnosis not present

## 2022-11-12 DIAGNOSIS — H04123 Dry eye syndrome of bilateral lacrimal glands: Secondary | ICD-10-CM | POA: Diagnosis not present

## 2022-11-28 ENCOUNTER — Other Ambulatory Visit: Payer: Self-pay | Admitting: Neurology

## 2022-11-28 DIAGNOSIS — M545 Low back pain, unspecified: Secondary | ICD-10-CM

## 2022-11-28 DIAGNOSIS — R1084 Generalized abdominal pain: Secondary | ICD-10-CM

## 2022-11-28 MED ORDER — OXYCODONE-ACETAMINOPHEN 10-325 MG PO TABS
1.0000 | ORAL_TABLET | Freq: Four times a day (QID) | ORAL | 0 refills | Status: DC | PRN
Start: 2022-12-05 — End: 2023-01-09

## 2022-11-28 NOTE — Telephone Encounter (Signed)
Pt is requesting a refill for oxyCODONE-acetaminophen (PERCOCET) 10-325 MG tablet .  Pharmacy: [id number]   

## 2022-11-28 NOTE — Telephone Encounter (Signed)
Last seen on 09/19/22 Follow up scheduled on 02/27/23 Last filled on 11/04/22 #120 tablets (30 day supply) Rx pending to signed

## 2022-12-09 ENCOUNTER — Encounter: Payer: Self-pay | Admitting: Obstetrics & Gynecology

## 2022-12-20 DIAGNOSIS — K760 Fatty (change of) liver, not elsewhere classified: Secondary | ICD-10-CM | POA: Diagnosis not present

## 2022-12-20 DIAGNOSIS — K5900 Constipation, unspecified: Secondary | ICD-10-CM | POA: Diagnosis not present

## 2022-12-20 DIAGNOSIS — R1084 Generalized abdominal pain: Secondary | ICD-10-CM | POA: Diagnosis not present

## 2022-12-20 DIAGNOSIS — K838 Other specified diseases of biliary tract: Secondary | ICD-10-CM | POA: Diagnosis not present

## 2022-12-20 DIAGNOSIS — Z86010 Personal history of colonic polyps: Secondary | ICD-10-CM | POA: Diagnosis not present

## 2022-12-20 DIAGNOSIS — R140 Abdominal distension (gaseous): Secondary | ICD-10-CM | POA: Diagnosis not present

## 2022-12-26 DIAGNOSIS — M25561 Pain in right knee: Secondary | ICD-10-CM | POA: Diagnosis not present

## 2022-12-26 DIAGNOSIS — Z6836 Body mass index (BMI) 36.0-36.9, adult: Secondary | ICD-10-CM | POA: Diagnosis not present

## 2022-12-26 DIAGNOSIS — G8929 Other chronic pain: Secondary | ICD-10-CM | POA: Diagnosis not present

## 2022-12-26 DIAGNOSIS — R300 Dysuria: Secondary | ICD-10-CM | POA: Diagnosis not present

## 2022-12-27 DIAGNOSIS — H40033 Anatomical narrow angle, bilateral: Secondary | ICD-10-CM | POA: Diagnosis not present

## 2023-01-03 DIAGNOSIS — H40033 Anatomical narrow angle, bilateral: Secondary | ICD-10-CM | POA: Diagnosis not present

## 2023-01-05 ENCOUNTER — Emergency Department (HOSPITAL_BASED_OUTPATIENT_CLINIC_OR_DEPARTMENT_OTHER): Payer: Medicare HMO

## 2023-01-05 ENCOUNTER — Other Ambulatory Visit: Payer: Self-pay

## 2023-01-05 ENCOUNTER — Encounter (HOSPITAL_BASED_OUTPATIENT_CLINIC_OR_DEPARTMENT_OTHER): Payer: Self-pay | Admitting: Emergency Medicine

## 2023-01-05 ENCOUNTER — Emergency Department (HOSPITAL_BASED_OUTPATIENT_CLINIC_OR_DEPARTMENT_OTHER)
Admission: EM | Admit: 2023-01-05 | Discharge: 2023-01-05 | Disposition: A | Discharge status: Home / Self Care | Payer: Medicare HMO | Attending: Emergency Medicine | Admitting: Emergency Medicine

## 2023-01-05 DIAGNOSIS — I10 Essential (primary) hypertension: Secondary | ICD-10-CM | POA: Insufficient documentation

## 2023-01-05 DIAGNOSIS — S8991XA Unspecified injury of right lower leg, initial encounter: Secondary | ICD-10-CM | POA: Diagnosis not present

## 2023-01-05 DIAGNOSIS — G43809 Other migraine, not intractable, without status migrainosus: Secondary | ICD-10-CM | POA: Diagnosis not present

## 2023-01-05 DIAGNOSIS — S39012A Strain of muscle, fascia and tendon of lower back, initial encounter: Secondary | ICD-10-CM | POA: Insufficient documentation

## 2023-01-05 DIAGNOSIS — M25561 Pain in right knee: Secondary | ICD-10-CM | POA: Diagnosis not present

## 2023-01-05 DIAGNOSIS — M25461 Effusion, right knee: Secondary | ICD-10-CM | POA: Insufficient documentation

## 2023-01-05 DIAGNOSIS — Y92410 Unspecified street and highway as the place of occurrence of the external cause: Secondary | ICD-10-CM | POA: Insufficient documentation

## 2023-01-05 DIAGNOSIS — S161XXA Strain of muscle, fascia and tendon at neck level, initial encounter: Secondary | ICD-10-CM | POA: Diagnosis not present

## 2023-01-05 DIAGNOSIS — S199XXA Unspecified injury of neck, initial encounter: Secondary | ICD-10-CM | POA: Diagnosis present

## 2023-01-05 DIAGNOSIS — M1711 Unilateral primary osteoarthritis, right knee: Secondary | ICD-10-CM | POA: Diagnosis not present

## 2023-01-05 MED ORDER — DIPHENHYDRAMINE HCL 50 MG/ML IJ SOLN
25.0000 mg | Freq: Once | INTRAMUSCULAR | Status: AC
  Administered 2023-01-05: 25 mg via INTRAVENOUS
  Filled 2023-01-05: qty 1

## 2023-01-05 MED ORDER — METOCLOPRAMIDE HCL 5 MG/ML IJ SOLN
10.0000 mg | Freq: Once | INTRAMUSCULAR | Status: AC
  Administered 2023-01-05: 10 mg via INTRAVENOUS
  Filled 2023-01-05: qty 2

## 2023-01-05 MED ORDER — KETOROLAC TROMETHAMINE 15 MG/ML IJ SOLN
15.0000 mg | Freq: Once | INTRAMUSCULAR | Status: AC
  Administered 2023-01-05: 15 mg via INTRAVENOUS
  Filled 2023-01-05: qty 1

## 2023-01-05 MED ORDER — METHOCARBAMOL 500 MG PO TABS: 500.0000 mg | ORAL_TABLET | Freq: Two times a day (BID) | ORAL | 0 refills | Status: DC | PRN

## 2023-01-05 MED ORDER — METHOCARBAMOL 500 MG PO TABS
500.0000 mg | ORAL_TABLET | Freq: Once | ORAL | Status: AC
  Administered 2023-01-05: 500 mg via ORAL
  Filled 2023-01-05: qty 1

## 2023-01-05 MED ORDER — LIDOCAINE 5 % EX PTCH: 1.0000 | MEDICATED_PATCH | CUTANEOUS | 0 refills | Status: DC

## 2023-01-05 MED ORDER — SODIUM CHLORIDE 0.9 % IV BOLUS
1000.0000 mL | Freq: Once | INTRAVENOUS | Status: AC
  Administered 2023-01-05: 1000 mL via INTRAVENOUS

## 2023-01-05 NOTE — Discharge Instructions (Addendum)
Please use Tylenol or ibuprofen for pain.  You may use 600 mg ibuprofen every 6 hours or 1000 mg of Tylenol every 6 hours.  You may choose to alternate between the 2.  This would be most effective.  Not to exceed 4 g of Tylenol within 24 hours.  Not to exceed 3200 mg ibuprofen 24 hours.  You can use the muscle relaxant I am prescribing in addition to the above to help with any breakthrough pain.  You can take it up to twice daily.  It is safe to take at night, but I would be cautious taking it during the day as it can cause some drowsiness.  Make sure that you are feeling awake and alert before you get behind the wheel of a car or operate a motor vehicle.  It is not a narcotic pain medication so you are able to take it if it is not making you drowsy and still pilot a vehicle or machinery safely.  I would wear the knee immobilizer while walking for a few days, at least around 1 week, if you have significant improvement of pain at that time you can discontinue and start walking with a soft knee brace or no support if you are feeling significant improvement.  Please follow-up with the orthopedic physician whose contact information provided if you continue to have significant pain of the knee despite treatment.
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
## 2023-01-06 ENCOUNTER — Other Ambulatory Visit: Payer: Self-pay | Admitting: Family Medicine

## 2023-01-06 DIAGNOSIS — J302 Other seasonal allergic rhinitis: Secondary | ICD-10-CM

## 2023-01-09 ENCOUNTER — Other Ambulatory Visit: Payer: Self-pay | Admitting: Neurology

## 2023-01-09 DIAGNOSIS — M25561 Pain in right knee: Secondary | ICD-10-CM | POA: Diagnosis not present

## 2023-01-09 DIAGNOSIS — M25361 Other instability, right knee: Secondary | ICD-10-CM | POA: Diagnosis not present

## 2023-01-09 DIAGNOSIS — M545 Low back pain, unspecified: Secondary | ICD-10-CM

## 2023-01-09 DIAGNOSIS — M1711 Unilateral primary osteoarthritis, right knee: Secondary | ICD-10-CM | POA: Diagnosis not present

## 2023-01-09 MED ORDER — OXYCODONE-ACETAMINOPHEN 10-325 MG PO TABS
1.0000 | ORAL_TABLET | Freq: Four times a day (QID) | ORAL | 0 refills | Status: DC | PRN
Start: 2023-01-09 — End: 2023-02-06

## 2023-01-09 NOTE — Telephone Encounter (Signed)
Dr.Athar you are work in provider in pm Dr.Sater patient  Last seen on 09/19/22 Follow up scheduled on 02/27/23 Last filled on 12/05/22 #120 tablets (30 day supply) Rx pending to be signed

## 2023-01-09 NOTE — Telephone Encounter (Signed)
Pt is requesting a refill for oxyCODONE-acetaminophen (PERCOCET) 10-325 MG tablet .  Pharmacy: [id number]   

## 2023-01-17 DIAGNOSIS — H5203 Hypermetropia, bilateral: Secondary | ICD-10-CM | POA: Diagnosis not present

## 2023-01-29 ENCOUNTER — Ambulatory Visit: Payer: Medicare HMO | Admitting: Obstetrics & Gynecology

## 2023-01-29 ENCOUNTER — Other Ambulatory Visit: Payer: Self-pay | Admitting: Obstetrics & Gynecology

## 2023-01-29 DIAGNOSIS — N924 Excessive bleeding in the premenopausal period: Secondary | ICD-10-CM

## 2023-01-31 ENCOUNTER — Emergency Department (HOSPITAL_BASED_OUTPATIENT_CLINIC_OR_DEPARTMENT_OTHER)
Admission: EM | Admit: 2023-01-31 | Discharge: 2023-01-31 | Disposition: A | Discharge status: Home / Self Care | Payer: Medicare HMO | Attending: Emergency Medicine | Admitting: Emergency Medicine

## 2023-01-31 ENCOUNTER — Encounter (HOSPITAL_BASED_OUTPATIENT_CLINIC_OR_DEPARTMENT_OTHER): Payer: Self-pay

## 2023-01-31 ENCOUNTER — Other Ambulatory Visit: Payer: Self-pay

## 2023-01-31 ENCOUNTER — Emergency Department (HOSPITAL_BASED_OUTPATIENT_CLINIC_OR_DEPARTMENT_OTHER): Payer: Medicare HMO

## 2023-01-31 ENCOUNTER — Ambulatory Visit (HOSPITAL_BASED_OUTPATIENT_CLINIC_OR_DEPARTMENT_OTHER): Admission: RE | Admit: 2023-01-31 | Payer: Medicare HMO | Source: Ambulatory Visit

## 2023-01-31 DIAGNOSIS — M48061 Spinal stenosis, lumbar region without neurogenic claudication: Secondary | ICD-10-CM | POA: Diagnosis not present

## 2023-01-31 DIAGNOSIS — M19011 Primary osteoarthritis, right shoulder: Secondary | ICD-10-CM | POA: Diagnosis not present

## 2023-01-31 DIAGNOSIS — N950 Postmenopausal bleeding: Secondary | ICD-10-CM

## 2023-01-31 DIAGNOSIS — M778 Other enthesopathies, not elsewhere classified: Secondary | ICD-10-CM | POA: Diagnosis not present

## 2023-01-31 DIAGNOSIS — M25511 Pain in right shoulder: Secondary | ICD-10-CM | POA: Diagnosis not present

## 2023-01-31 DIAGNOSIS — M545 Low back pain, unspecified: Secondary | ICD-10-CM | POA: Insufficient documentation

## 2023-01-31 DIAGNOSIS — M419 Scoliosis, unspecified: Secondary | ICD-10-CM | POA: Diagnosis not present

## 2023-01-31 DIAGNOSIS — Y92410 Unspecified street and highway as the place of occurrence of the external cause: Secondary | ICD-10-CM | POA: Insufficient documentation

## 2023-01-31 DIAGNOSIS — M19012 Primary osteoarthritis, left shoulder: Secondary | ICD-10-CM | POA: Diagnosis not present

## 2023-01-31 DIAGNOSIS — M5136 Other intervertebral disc degeneration, lumbar region with discogenic back pain only: Secondary | ICD-10-CM | POA: Diagnosis not present

## 2023-01-31 DIAGNOSIS — M25512 Pain in left shoulder: Secondary | ICD-10-CM | POA: Insufficient documentation

## 2023-01-31 DIAGNOSIS — M542 Cervicalgia: Secondary | ICD-10-CM | POA: Diagnosis not present

## 2023-01-31 MED ORDER — METHOCARBAMOL 500 MG PO TABS
500.0000 mg | ORAL_TABLET | Freq: Two times a day (BID) | ORAL | 0 refills | Status: AC
Start: 2023-01-31 — End: ?

## 2023-01-31 MED ORDER — LIDOCAINE 5 % EX PTCH: 1.0000 | MEDICATED_PATCH | CUTANEOUS | 0 refills | Status: DC

## 2023-01-31 MED ORDER — PROMETHAZINE HCL 12.5 MG PO TABS: 12.5000 mg | ORAL_TABLET | Freq: Four times a day (QID) | ORAL | 0 refills | Status: DC | PRN

## 2023-01-31 MED ORDER — NAPROXEN 500 MG PO TABS: 500.0000 mg | ORAL_TABLET | Freq: Two times a day (BID) | ORAL | 0 refills | Status: DC
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
## 2023-01-31 NOTE — Discharge Instructions (Signed)

## 2023-01-31 NOTE — ED Notes (Signed)
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
## 2023-02-01 ENCOUNTER — Other Ambulatory Visit: Payer: Self-pay | Admitting: Family Medicine

## 2023-02-02 ENCOUNTER — Ambulatory Visit (HOSPITAL_BASED_OUTPATIENT_CLINIC_OR_DEPARTMENT_OTHER)
Admission: RE | Admit: 2023-02-02 | Discharge: 2023-02-02 | Disposition: A | Discharge status: Home / Self Care | Payer: Medicare HMO | Source: Ambulatory Visit | Attending: Obstetrics & Gynecology | Admitting: Obstetrics & Gynecology

## 2023-02-02 DIAGNOSIS — N854 Malposition of uterus: Secondary | ICD-10-CM | POA: Diagnosis not present

## 2023-02-02 DIAGNOSIS — N939 Abnormal uterine and vaginal bleeding, unspecified: Secondary | ICD-10-CM | POA: Diagnosis not present

## 2023-02-02 DIAGNOSIS — D259 Leiomyoma of uterus, unspecified: Secondary | ICD-10-CM | POA: Diagnosis not present

## 2023-02-02 DIAGNOSIS — N950 Postmenopausal bleeding: Secondary | ICD-10-CM | POA: Insufficient documentation

## 2023-02-03 ENCOUNTER — Ambulatory Visit: Payer: Medicare HMO | Admitting: Obstetrics & Gynecology

## 2023-02-03 DIAGNOSIS — M25561 Pain in right knee: Secondary | ICD-10-CM | POA: Diagnosis not present

## 2023-02-03 DIAGNOSIS — M25361 Other instability, right knee: Secondary | ICD-10-CM | POA: Diagnosis not present

## 2023-02-03 DIAGNOSIS — M1711 Unilateral primary osteoarthritis, right knee: Secondary | ICD-10-CM | POA: Diagnosis not present

## 2023-02-05 ENCOUNTER — Encounter: Payer: Self-pay | Admitting: Medical

## 2023-02-05 ENCOUNTER — Ambulatory Visit (INDEPENDENT_AMBULATORY_CARE_PROVIDER_SITE_OTHER): Payer: Medicare HMO | Admitting: Medical

## 2023-02-05 VITALS — BP 159/89 | HR 79 | Wt 222.0 lb

## 2023-02-05 DIAGNOSIS — R1084 Generalized abdominal pain: Secondary | ICD-10-CM | POA: Diagnosis not present

## 2023-02-05 DIAGNOSIS — N950 Postmenopausal bleeding: Secondary | ICD-10-CM | POA: Diagnosis not present

## 2023-02-05 NOTE — Progress Notes (Signed)
   History:  [name] is a 67 y.o. G1P1000 who presents to clinic today for post menopausal bleeding. The patient noted light bleeding on Thursday of last week. She could see some pink with wiping. It become a little darker following that episode as well. She has a recent history of breast cancer and was advised by oncology to call immediately with any vaginal bleeding due to her increased risk. She has previously had PMB due to a polyp that was removed during a hysteroscopy D&C last year. She had an [place] on 10/6 that showed multiple fibroids and a thickened endometrium. She has a history of fibroids, but previous scans do not indicate multiple were present prior to 10/6.    The following portions of the patient's history were reviewed and updated as appropriate: allergies, current medications, family history, past medical history, social history, past surgical history and problem list.  Review of Systems:  Review of Systems  Constitutional:  Negative for fever and malaise/fatigue.  Gastrointestinal:  Positive for abdominal pain.  Genitourinary:        + vaginal bleeding      Objective:  Physical Exam BP (!) 159/89   Pulse 79   Wt 222 lb (100.7 kg)   BMI 36.94 kg/m  Physical Exam Constitutional:      General: She is not in acute distress.    Appearance: Normal appearance. She is obese. She is not ill-appearing.  Cardiovascular:     Rate and Rhythm: Normal rate.  Pulmonary:     Effort: Pulmonary effort is normal.  Skin:    General: Skin is warm and dry.     Findings: No erythema.  Neurological:     Mental Status: She is alert and oriented to person, place, and time.     Health Maintenance Due  Topic Date Due   INFLUENZA VACCINE  Never done    Labs, imaging and previous visits in Epic reviewed  Assessment & Plan:  1. Post-menopausal bleeding - counseled patient on potential options for evaluation and management  - reviewed patient with Dr. Erin Fulling who  will call the patient to review treatment options  2. Generalized abdominal pain   Approximately 25 minutes of total time was spent with this patient on chart review, history taking, patient education, consultation and documentation.   Return in about 2 weeks (around 02/19/2023) for with [facility] to discuss surgical management .  Marny Lowenstein, PA-C 02/10/2023 9:26 AM

## 2023-02-06 ENCOUNTER — Telehealth: Payer: Self-pay | Admitting: Obstetrics & Gynecology

## 2023-02-06 ENCOUNTER — Other Ambulatory Visit: Payer: Self-pay | Admitting: Neurology

## 2023-02-06 DIAGNOSIS — M545 Low back pain, unspecified: Secondary | ICD-10-CM

## 2023-02-06 MED ORDER — OXYCODONE-ACETAMINOPHEN 10-325 MG PO TABS
1.0000 | ORAL_TABLET | Freq: Four times a day (QID) | ORAL | 0 refills | Status: DC | PRN
Start: 2023-02-06 — End: 2023-02-27

## 2023-02-06 NOTE — Telephone Encounter (Addendum)
Pt last seen 09/19/22 and next f/u 02/27/23. Last refilled oxycodone 01/10/23 #120.

## 2023-02-06 NOTE — Telephone Encounter (Signed)
TC to pt. She PMPB of a small amount. She is very worried as she had breast cancer and was on Tamoxifen.  She would like to consider surgery after discussion and she understands that she will need to have a endo bx prior to that. I have recommended Dr. Briscoe Deutscher to perform her surgery.   All of her questions were answered.   Clh-S Kimberly Mann, M.D., Evern Core

## 2023-02-06 NOTE — Telephone Encounter (Signed)
Pt's son, Lerry Paterson request refill oxyCODONE-acetaminophen (PERCOCET) 10-325 MG tablet sent to [facility] [phone]

## 2023-02-12 ENCOUNTER — Encounter: Payer: Self-pay | Admitting: Family Medicine

## 2023-02-12 ENCOUNTER — Ambulatory Visit: Payer: Medicare HMO | Admitting: Family Medicine

## 2023-02-12 ENCOUNTER — Other Ambulatory Visit: Payer: Medicare HMO | Admitting: Obstetrics & Gynecology

## 2023-02-12 VITALS — BP 132/85 | HR 87 | Temp 98.7°F | Ht 65.0 in | Wt 222.0 lb

## 2023-02-12 DIAGNOSIS — E559 Vitamin D deficiency, unspecified: Secondary | ICD-10-CM

## 2023-02-12 DIAGNOSIS — R7303 Prediabetes: Secondary | ICD-10-CM | POA: Diagnosis not present

## 2023-02-12 DIAGNOSIS — I10 Essential (primary) hypertension: Secondary | ICD-10-CM
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
## 2023-02-12 NOTE — Patient Instructions (Addendum)
Give [place] 2-3 business days to get the results of your labs back.   Keep the diet clean and stay active.  Check your blood pressures 2-3 times per week, alternating the time of day you check it. If it is high, considering waiting 1-2 minutes and rechecking. If it gets higher, your anxiety is likely creeping up and we should avoid rechecking.   Let us know if you need anything.

## 2023-02-13 LAB — LIPID PANEL
Cholesterol: 266 mg/dL — ABNORMAL HIGH (ref 0–200)
HDL: 64.2 mg/dL (ref >39.00)
LDL Cholesterol: 185 mg/dL — ABNORMAL HIGH (ref 0–99)
NonHDL: 202.27
Total CHOL/HDL Ratio: 4
Triglycerides: 85 mg/dL (ref 0.0–149.0)
VLDL: 17 mg/dL (ref 0.0–40.0)

## 2023-02-13 LAB — COMPREHENSIVE METABOLIC PANEL
ALT: 29 U/L (ref 0–35)
AST: 22 U/L (ref 0–37)
Albumin: 4.4 g/dL (ref 3.5–5.2)
Alkaline Phosphatase: 76 U/L (ref 39–117)
BUN: 23 mg/dL (ref 6–23)
CO2: 27 meq/L (ref 19–32)
Calcium: 9.7 mg/dL (ref 8.4–10.5)
Chloride: 103 meq/L (ref 96–112)
Creatinine, Ser: 0.9 mg/dL (ref 0.40–1.20)
GFR: 65.64 mL/min (ref >60.00)
Glucose, Bld: 81 mg/dL (ref 70–99)
Potassium: 3.9 meq/L (ref 3.5–5.1)
Sodium: 141 meq/L (ref 135–145)
Total Bilirubin: 0.3 mg/dL (ref 0.2–1.2)
Total Protein: 7.5 g/dL (ref 6.0–8.3)

## 2023-02-13 LAB — CBC
HCT: 45 % (ref 36.0–46.0)
Hemoglobin: 14.3 g/dL (ref 12.0–15.0)
MCHC: 31.7 g/dL (ref 30.0–36.0)
MCV: 91.4 fL (ref 78.0–100.0)
Platelets: 323 10*3/uL (ref 150.0–400.0)
RBC: 4.92 Mil/uL (ref 3.87–5.11)
RDW: 14.6 % (ref 11.5–15.5)
WBC: 6.5 10*3/uL (ref 4.0–10.5)

## 2023-02-13 LAB — VITAMIN D 25 HYDROXY (VIT D DEFICIENCY, FRACTURES): VITD: 48.73 ng/mL (ref 30.00–100.00)

## 2023-02-13 LAB — HEMOGLOBIN A1C: Hgb A1c MFr Bld: 5.8 % (ref 4.6–6.5)

## 2023-02-14 ENCOUNTER — Other Ambulatory Visit: Payer: Self-pay | Admitting: Family Medicine

## 2023-02-14 DIAGNOSIS — I10 Essential (primary) hypertension: Secondary | ICD-10-CM

## 2023-02-14 DIAGNOSIS — E785 Hyperlipidemia, unspecified: Secondary | ICD-10-CM

## 2023-02-14 MED ORDER — EZETIMIBE 10 MG PO TABS: 10.0000 mg | ORAL_TABLET | Freq: Every day | ORAL | 3 refills | Status: DC

## 2023-02-14 NOTE — Addendum Note (Signed)
Addended by: Radene Gunning on: 02/14/2023 04:26 PM   Modules accepted: Orders

## 2023-02-14 NOTE — Addendum Note (Signed)
Addended by: Scharlene Gloss B on: 02/14/2023 03:31 PM   Modules accepted: Orders
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
## 2023-02-27 ENCOUNTER — Encounter: Payer: Self-pay | Admitting: Neurology

## 2023-02-27 ENCOUNTER — Ambulatory Visit (INDEPENDENT_AMBULATORY_CARE_PROVIDER_SITE_OTHER): Payer: Medicare HMO | Admitting: Neurology

## 2023-02-27 VITALS — BP 136/86 | HR 70 | Ht 65.0 in | Wt 225.0 lb

## 2023-02-27 DIAGNOSIS — M542 Cervicalgia: Secondary | ICD-10-CM

## 2023-02-27 DIAGNOSIS — M5431 Sciatica, right side: Secondary | ICD-10-CM

## 2023-02-27 DIAGNOSIS — Z79891 Long term (current) use of opiate analgesic: Secondary | ICD-10-CM

## 2023-02-27 DIAGNOSIS — M5432 Sciatica, left side: Secondary | ICD-10-CM | POA: Diagnosis not present

## 2023-02-27 DIAGNOSIS — M7551 Bursitis of right shoulder: Secondary | ICD-10-CM | POA: Diagnosis not present

## 2023-02-27 DIAGNOSIS — G43009 Migraine without aura, not intractable, without status migrainosus: Secondary | ICD-10-CM

## 2023-02-27 DIAGNOSIS — M8580 Other specified disorders of bone density and structure, unspecified site: Secondary | ICD-10-CM

## 2023-02-27 DIAGNOSIS — J302 Other seasonal allergic rhinitis: Secondary | ICD-10-CM

## 2023-02-27 DIAGNOSIS — M545 Low back pain, unspecified: Secondary | ICD-10-CM

## 2023-02-27 DIAGNOSIS — M7552 Bursitis of left shoulder: Secondary | ICD-10-CM | POA: Diagnosis not present

## 2023-02-27 MED ORDER — OXYCODONE-ACETAMINOPHEN 10-325 MG PO TABS: 1.0000 | ORAL_TABLET | Freq: Four times a day (QID) | ORAL | 0 refills | Status: DC | PRN

## 2023-02-27 MED ORDER — FLUCONAZOLE 150 MG PO TABS: 150.0000 mg | ORAL_TABLET | Freq: Every day | ORAL | 0 refills | Status: DC
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
## 2023-03-21 ENCOUNTER — Other Ambulatory Visit (INDEPENDENT_AMBULATORY_CARE_PROVIDER_SITE_OTHER): Payer: Medicare HMO

## 2023-03-21 DIAGNOSIS — E785 Hyperlipidemia, unspecified: Secondary | ICD-10-CM | POA: Diagnosis not present

## 2023-03-21 NOTE — Addendum Note (Signed)
Addended by: Thelma Barge D on: 03/21/2023 12:27 PM   Modules accepted: Orders

## 2023-03-22 LAB — LIPID PANEL
Cholesterol: 244 mg/dL — ABNORMAL HIGH (ref <200)
HDL: 65 mg/dL (ref >=50)
LDL Cholesterol (Calc): 157 mg/dL — ABNORMAL HIGH
Non-HDL Cholesterol (Calc): 179 mg/dL — ABNORMAL HIGH (ref <130)
Total CHOL/HDL Ratio: 3.8 (calc) (ref <5.0)
Triglycerides: 108 mg/dL (ref <150)

## 2023-03-24 ENCOUNTER — Other Ambulatory Visit (HOSPITAL_COMMUNITY)
Admission: RE | Admit: 2023-03-24 | Discharge: 2023-03-24 | Disposition: A | Discharge status: Home / Self Care | Payer: Medicare HMO | Source: Ambulatory Visit | Attending: Obstetrics and Gynecology | Admitting: Obstetrics and Gynecology

## 2023-03-24 ENCOUNTER — Emergency Department (HOSPITAL_BASED_OUTPATIENT_CLINIC_OR_DEPARTMENT_OTHER)
Admission: EM | Admit: 2023-03-24 | Discharge: 2023-03-24 | Disposition: A | Discharge status: Home / Self Care | Payer: Medicare HMO | Attending: Emergency Medicine | Admitting: Emergency Medicine

## 2023-03-24 ENCOUNTER — Encounter (HOSPITAL_BASED_OUTPATIENT_CLINIC_OR_DEPARTMENT_OTHER): Payer: Self-pay

## 2023-03-24 ENCOUNTER — Ambulatory Visit: Payer: Medicare HMO | Admitting: Obstetrics and Gynecology

## 2023-03-24 ENCOUNTER — Emergency Department (HOSPITAL_BASED_OUTPATIENT_CLINIC_OR_DEPARTMENT_OTHER): Payer: Medicare HMO

## 2023-03-24 ENCOUNTER — Other Ambulatory Visit: Payer: Self-pay

## 2023-03-24 ENCOUNTER — Encounter: Payer: Self-pay | Admitting: Obstetrics and Gynecology

## 2023-03-24 ENCOUNTER — Encounter (HOSPITAL_BASED_OUTPATIENT_CLINIC_OR_DEPARTMENT_OTHER): Payer: Self-pay | Admitting: Obstetrics and Gynecology

## 2023-03-24 ENCOUNTER — Telehealth: Payer: Self-pay

## 2023-03-24 VITALS — BP 172/80 | HR 89 | Wt 224.0 lb

## 2023-03-24 DIAGNOSIS — I10 Essential (primary) hypertension: Secondary | ICD-10-CM | POA: Diagnosis not present

## 2023-03-24 DIAGNOSIS — R079 Chest pain, unspecified: Secondary | ICD-10-CM | POA: Diagnosis not present

## 2023-03-24 DIAGNOSIS — F4329 Adjustment disorder with other symptoms: Secondary | ICD-10-CM

## 2023-03-24 DIAGNOSIS — E876 Hypokalemia: Secondary | ICD-10-CM | POA: Diagnosis not present

## 2023-03-24 DIAGNOSIS — Z3202 Encounter for pregnancy test, result negative: Secondary | ICD-10-CM

## 2023-03-24 DIAGNOSIS — R062 Wheezing: Secondary | ICD-10-CM | POA: Insufficient documentation

## 2023-03-24 DIAGNOSIS — N950 Postmenopausal bleeding: Secondary | ICD-10-CM

## 2023-03-24 DIAGNOSIS — R002 Palpitations: Secondary | ICD-10-CM | POA: Insufficient documentation

## 2023-03-24 LAB — BASIC METABOLIC PANEL
Anion gap: 12 (ref 5–15)
BUN: 18 mg/dL (ref 8–23)
CO2: 24 mmol/L (ref 22–32)
Calcium: 9.6 mg/dL (ref 8.9–10.3)
Chloride: 103 mmol/L (ref 98–111)
Creatinine, Ser: 0.85 mg/dL (ref 0.44–1.00)
GFR, Estimated: >60 mL/min (ref >60)
Glucose, Bld: 80 mg/dL (ref 70–99)
Potassium: 3.4 mmol/L — ABNORMAL LOW (ref 3.5–5.1)
Sodium: 139 mmol/L (ref 135–145)

## 2023-03-24 LAB — URINALYSIS, ROUTINE W REFLEX MICROSCOPIC
Bilirubin Urine: NEGATIVE
Glucose, UA: NEGATIVE mg/dL
Ketones, ur: NEGATIVE mg/dL
Leukocytes,Ua: NEGATIVE
Nitrite: NEGATIVE
Protein, ur: NEGATIVE mg/dL
Specific Gravity, Urine: 1.015 (ref 1.005–1.030)
pH: 5.5 (ref 5.0–8.0)

## 2023-03-24 LAB — CBC
HCT: 44.6 % (ref 36.0–46.0)
Hemoglobin: 14.4 g/dL (ref 12.0–15.0)
MCH: 29.2 pg (ref 26.0–34.0)
MCHC: 32.3 g/dL (ref 30.0–36.0)
MCV: 90.5 fL (ref 80.0–100.0)
Platelets: 255 10*3/uL (ref 150–400)
RBC: 4.93 MIL/uL (ref 3.87–5.11)
RDW: 13.5 % (ref 11.5–15.5)
WBC: 5.9 10*3/uL (ref 4.0–10.5)
nRBC: 0 % (ref 0.0–0.2)

## 2023-03-24 LAB — URINALYSIS, MICROSCOPIC (REFLEX)

## 2023-03-24 LAB — TROPONIN I (HIGH SENSITIVITY): Troponin I (High Sensitivity): 6 ng/L (ref <18)

## 2023-03-24 LAB — CBG MONITORING, ED: Glucose-Capillary: 74 mg/dL (ref 70–99)

## 2023-03-24 MED ORDER — POTASSIUM CHLORIDE CRYS ER 20 MEQ PO TBCR
40.0000 meq | EXTENDED_RELEASE_TABLET | Freq: Once | ORAL | Status: AC
  Administered 2023-03-24: 40 meq via ORAL
  Filled 2023-03-24: qty 2

## 2023-03-24 NOTE — Discharge Instructions (Addendum)
Seen in the emergency room for chest pain and palpitations.  Your lab work imaging and EKG are reassuring.  I recommend following up with primary care to ensure resolution of symptoms.  Your potassium was found to be low thus we supplemented potassium here before your discharge.  Please return to the emergency room with any new or worsening symptoms.

## 2023-03-24 NOTE — Progress Notes (Signed)
Kimberly Mann has been transferred to the ED for further evaluation regarding elevated blood pressures, c/o chest pain, and visual disturbances.

## 2023-03-24 NOTE — ED Triage Notes (Signed)
Pt was at her doctor's office today for an endometrial biopsy when she started experiencing heart palpitations, dizziness, and blurred vision and indigestion/pain in her central chest; onset around 1330 when she arrived at her appointment. She did have the procedure done but states she felt bad before it started but worsened afterwards.

## 2023-03-24 NOTE — ED Provider Notes (Signed)
[facility] Provider Note   CSN: [id number] Arrival date & time: 03/24/23  1534     History  Chief Complaint  Patient presents with   Palpitations    Kimberly Mann is a 67 y.o. female with past medical history of hyperlipidemia and hypertension presenting to emergency room after having stressful event and endometrial biopsy earlier today.  Patient reports that her endometrial biopsy if she started having chest pain abdominal pain and palpitations.  She felt very overwhelmed and contributes her symptoms to anxiety.  Patient reports she is also been dealing with a lot of stress at home with family getting bypass.  Denies any current chest pain shortness of breath calf tenderness or swelling, headache or blurry vision change in vision.  Requesting discharge and food because she also thinks she just be hungry.   Palpitations      Home Medications Prior to Admission medications   Medication Sig Start Date End Date Taking? Authorizing Provider  ezetimibe (ZETIA) 10 MG tablet Take 1 tablet (10 mg total) by mouth daily. 02/14/23   Sharlene Dory, DO  fluconazole (DIFLUCAN) 150 MG tablet Take 1 tablet (150 mg total) by mouth daily. 02/27/23   Sater, Pearletha Furl, MD  fluticasone Aleda Grana) 50 MCG/ACT nasal spray Use 2 spray(s) in each nostril once daily 01/07/23   Wendling, Jilda Roche, DO  hydrOXYzine (VISTARIL) 25 MG capsule Take 1 capsule (25 mg total) by mouth 3 (three) times daily as needed. 08/28/21   Sater, Pearletha Furl, MD  levocetirizine (XYZAL) 5 MG tablet TAKE 1 TABLET BY MOUTH ONCE DAILY IN THE EVENING 09/02/22   Wendling, Jilda Roche, DO  lidocaine (LIDODERM) 5 % Place 1 patch onto the skin daily. Remove & Discard patch within 12 hours or as directed by MD 01/31/23   Henderly, Britni A, PA-C  methocarbamol (ROBAXIN) 500 MG tablet Take 1 tablet (500 mg total) by mouth 2 (two) times daily. 01/31/23   Henderly, Britni A, PA-C   Multiple Vitamins-Minerals (MULTIVITAMIN ADULT PO)  03/29/15   [provider]  naproxen (NAPROSYN) 500 MG tablet Take 1 tablet (500 mg total) by mouth 2 (two) times daily. 01/31/23   Henderly, Britni A, PA-C  oxyCODONE-acetaminophen (PERCOCET) 10-325 MG tablet Take 1 tablet by mouth every 6 (six) hours as needed for pain. 02/27/23   Sater, Pearletha Furl, MD  pantoprazole (PROTONIX) 40 MG tablet Take 1 tablet (40 mg total) by mouth daily. 06/12/21   Sharlene Dory, DO  promethazine (PHENERGAN) 12.5 MG tablet Take 1 tablet (12.5 mg total) by mouth every 6 (six) hours as needed for nausea or vomiting. 01/31/23   Henderly, Britni A, PA-C  tamoxifen (NOLVADEX) 20 MG tablet Take 20 mg by mouth daily. Patient not taking: Reported on 03/24/2023    [provider]  triamterene-hydrochlorothiazide (MAXZIDE-25) 37.5-25 MG tablet Take 1 tablet by mouth daily. Take 1/2 (one-half) tablet by mouth once daily 07/17/22   Sharlene Dory, DO  Vitamin D, Ergocalciferol, (DRISDOL) 1.25 MG (50000 UNIT) CAPS capsule Take 1 capsule by mouth once a week 02/03/23   Sharlene Dory, DO      Allergies    Morphine and codeine, Other, Sulfacetamide sodium, Penicillins, Statins, Sulfa antibiotics, and Morphine    Review of Systems   Review of Systems  Cardiovascular:  Positive for palpitations.    Physical Exam Updated Vital Signs BP (!) 141/103 (BP Location: Left Arm)   Pulse 78   Temp 97.7  F (36.5 C)   Resp 18   Ht 5\' 5"  (1.651 m)   Wt 101.6 kg   SpO2 100%   BMI 37.28 kg/m  Physical Exam Vitals and nursing note reviewed.  Constitutional:      General: She is not in acute distress.    Appearance: She is not toxic-appearing.  HENT:     Head: Normocephalic and atraumatic.  Eyes:     General: No scleral icterus.    Conjunctiva/sclera: Conjunctivae normal.  Cardiovascular:     Rate and Rhythm: Normal rate and regular rhythm.     Pulses: Normal pulses.     Heart  sounds: Normal heart sounds.  Pulmonary:     Effort: Pulmonary effort is normal. No respiratory distress.     Breath sounds: Wheezing present.     Comments: Mild wheezes noted on exam.  Patient denies any shortness of breath or cough. Abdominal:     General: Abdomen is flat. Bowel sounds are normal.     Palpations: Abdomen is soft.     Tenderness: There is no abdominal tenderness.  Musculoskeletal:     Right lower leg: No edema.     Left lower leg: No edema.  Skin:    General: Skin is warm and dry.     Findings: No lesion.  Neurological:     General: No focal deficit present.     Mental Status: She is alert and oriented to person, place, and time. Mental status is at baseline.     ED Results / Procedures / Treatments   Labs (all labs ordered are listed, but only abnormal results are displayed) Labs Reviewed  URINALYSIS, ROUTINE W REFLEX MICROSCOPIC - Abnormal; Notable for the following components:      Result Value   APPearance CLOUDY (*)    Hgb urine dipstick LARGE (*)    All other components within normal limits  BASIC METABOLIC PANEL - Abnormal; Notable for the following components:   Potassium 3.4 (*)    All other components within normal limits  URINALYSIS, MICROSCOPIC (REFLEX) - Abnormal; Notable for the following components:   Bacteria, UA RARE (*)    All other components within normal limits  CBC  CBG MONITORING, ED  TROPONIN I (HIGH SENSITIVITY)    EKG EKG Interpretation Date/Time:  Monday March 24 2023 15:47:15 EST Ventricular Rate:  74 PR Interval:  166 QRS Duration:  92 QT Interval:  412 QTC Calculation: 457 R Axis:   -4  Text Interpretation: Normal sinus rhythm Nonspecific T wave abnormality Abnormal ECG When compared with ECG of 21-Dec-2020 18:56, PREVIOUS ECG IS PRESENT Confirmed by Virgina Norfolk [phone]) on 03/24/2023 3:52:15 PM  Radiology DG Chest 2 View  Result Date: 03/24/2023 CLINICAL DATA:  Chest pain. EXAM: CHEST - 2 VIEW COMPARISON:   December 21, 2020. FINDINGS: The heart size and mediastinal contours are within normal limits. Both lungs are clear. The visualized skeletal structures are unremarkable. IMPRESSION: No active cardiopulmonary disease. Electronically Signed   By: Lupita Raider M.D.   On: 03/24/2023 16:45    Procedures Procedures    Medications Ordered in ED Medications - No data to display  ED Course/ Medical Decision Making/ A&P                                 Medical Decision Making Amount and/or Complexity of Data Reviewed Labs: ordered. Radiology: ordered.  Risk Prescription drug management.  Lucile Crater 67 y.o. presented today for chest pain. Working DDx that I considered at this time includes, but not limited to, ACS, GERD, pe, pna, aortic dissection, pneumothorax, MSK path, anemia, esophageal rupture, CHF exacerbation, valvular disorder, myocarditis, pericarditis, endocarditis, pericardial effusion/cardiac tamponade, pulmonary edema, gastritis/PUD, esophagitis.  R/o Dx: These are considered less likely due to history of present illness and physical exam findings.  PE: advanced imaging will not be ordered as alternative diagnoses are more likely at this time Aortic Dissection: less likely based on the history of present illness - location, quality, onset, and severity of symptoms in this case.   Review of prior external notes: none  Unique Tests and My Interpretation:  EKG: Rate, rhythm, axis, intervals all examined: Sinus with nonspecific T wave abnormality Troponin: Within normal limits, repeat troponin was not obtained secondary to duration of symptoms. CXR: no cardiopulmonary pathology  CBC: No leukocytosis or anemia BMP: Potassium 3.4 otherwise normal BMP no elevated anion gap or acute kidney injury  Problem List / ED Course / Critical interventions / Medication management  Patient reported to emergency room after having chest pain palpitations at prior visit this morning.   Started around 2.  Resolve for arriving to emergency room.  Patient is feeling much better.  On exam no focal neurological deficits into questions appropriately hemodynamically stable and well-appearing.  Labs, imaging and EKG were reassuring.  Symptoms are not consistent with an ACS picture nor CHF exacerbation.  No signs of pneumonia.  Symptoms not typical of aortic dissection.  BP checked in room with patient 140/80 at bedside.  Heart rate within normal limits.  Patient's potassium was low this supplemented discussed following up with primary care to ensure resolution of symptoms and discussing ZIO monitor if symptoms return. Discussed case with attending who recommends d/c.  I ordered medication including potassium Reevaluation of the patient after these medicines showed that the patient resolved Patients vitals assessed. Upon arrival patient is  hemodynamically stable.  I have reviewed the patients home medicines and have made adjustments as needed      Plan:  F/u w/ PCP to ensure resolution of sx.  Patient was given return precautions. Patient stable for discharge at this time.  Patient educated on sx/ dx and verbalized understanding of plan.  Will return to ER w/ new or worsening sx.          Final Clinical Impression(s) / ED Diagnoses Final diagnoses:  Palpitations  Hypokalemia    Rx / DC Orders ED Discharge Orders     None         Smitty Knudsen, PA-C 03/24/23 1910    Virgina Norfolk, DO 03/24/23 2230

## 2023-03-24 NOTE — Addendum Note (Signed)
Addended by: Leola Brazil on: 03/24/2023 04:59 PM   Modules accepted: Orders

## 2023-03-24 NOTE — ED Notes (Signed)

## 2023-03-24 NOTE — Telephone Encounter (Signed)
Attempted to reach patient to advise Dr. Briscoe Deutscher has an opening on 04/01/23 @[facility]  at 12 pm. Left voicemail stating that I secured the time in the OR and asked patient to call me to confirm procedure. [phone].
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
## 2023-03-25 ENCOUNTER — Other Ambulatory Visit: Payer: Self-pay | Admitting: Obstetrics and Gynecology

## 2023-03-25 ENCOUNTER — Telehealth: Payer: Self-pay | Admitting: *Deleted

## 2023-03-25 ENCOUNTER — Encounter (HOSPITAL_COMMUNITY): Payer: Self-pay | Admitting: Anesthesiology

## 2023-03-25 ENCOUNTER — Telehealth: Payer: Self-pay

## 2023-03-25 NOTE — Anesthesia Preprocedure Evaluation (Signed)
Anesthesia Evaluation    Airway        Dental   Pulmonary former smoker          Cardiovascular hypertension,      Neuro/Psych    GI/Hepatic   Endo/Other    Renal/GU      Musculoskeletal   Abdominal   Peds  Hematology   Anesthesia Other Findings   Reproductive/Obstetrics                             Anesthesia Physical Anesthesia Plan  ASA:   Anesthesia Plan:    Post-op Pain Management:    Induction:   PONV Risk Score and Plan:   Airway Management Planned:   Additional Equipment:   Intra-op Plan:   Post-operative Plan:   Informed Consent:   Plan Discussed with:   Anesthesia Plan Comments: (Pt has intermittent elevated diastolic pressures.  It would be ideal to see her primary care physician before her surgery.  This will be communicated to the OBGYN.  In the event they can NOT get her in prior to her surgery, then day of anesthesiologist will make a decision depending on BP's DOS and potentially cancel case. )       Anesthesia Quick Evaluation

## 2023-03-25 NOTE — Transitions of Care (Post Inpatient/ED Visit) (Signed)
   03/25/2023  Name: Kimberly Mann MRN: [id number] DOB: 1955/10/10  Today's TOC FU Call Status: Today's TOC FU Call Status:: Unsuccessful Call (1st Attempt) Unsuccessful Call (1st Attempt) Date: 03/25/23  Attempted to reach the patient regarding the most recent Inpatient/ED visit.  Follow Up Plan: Additional outreach attempts will be made to reach the patient to complete the Transitions of Care (Post Inpatient/ED visit) call.   Signature Jerick Khachatryan, [facility]

## 2023-03-25 NOTE — Telephone Encounter (Signed)
Patient called requesting a Rx for Naproxen. Patient had an endometrial biopsy yesterday.Advised patient to take ibuprofen. Patient states she will try ibuprofen but she prefers Naproxen. A message was sent to the provider. Pachia Strum l Johnita Palleschi, CMA

## 2023-03-25 NOTE — Progress Notes (Signed)
Reviewing patient chart for pre-op interview surgery scheduled @ [facility] on 04-01-2023 by Dr Briscoe Deutscher for total hysterectomy.  Noted patient at ED [facility] for high BP/ palpitations/ chest pain sent from Dr David Stall office after attempted endometrial biopsy.  On arrival bp 141/103, at discharge 145/81 diagnosis palpations/ hypokalemia (k+ 3.4) patient given instruction to follow up with PCP to ensure resolution of symptoms. Chart reviewed w/ anesthesia, Dr Bea Laura. [facility].  Dr Tacy Dura stated patient will need to be cleared by her PCP prior to surgery, however, in the event patient can not see her pcp prior to day of surgery,   patient will be assessed by anesthesia depending on BPs up potential for  cancel day of surgery.  This note was routed to Dr Briscoe Deutscher and OR scheduler Donne Hazel in epic.

## 2023-03-26 LAB — SURGICAL PATHOLOGY

## 2023-03-29 MED ORDER — NAPROXEN 500 MG PO TABS: 500.0000 mg | ORAL_TABLET | Freq: Two times a day (BID) | ORAL | 1 refills | Status: DC

## 2023-04-01 ENCOUNTER — Other Ambulatory Visit: Payer: Self-pay

## 2023-04-01 ENCOUNTER — Telehealth: Payer: Self-pay | Admitting: *Deleted

## 2023-04-01 ENCOUNTER — Other Ambulatory Visit: Payer: Self-pay | Admitting: Family Medicine

## 2023-04-01 ENCOUNTER — Telehealth: Payer: Self-pay | Admitting: Family Medicine

## 2023-04-01 ENCOUNTER — Ambulatory Visit (HOSPITAL_BASED_OUTPATIENT_CLINIC_OR_DEPARTMENT_OTHER)
Admission: RE | Admit: 2023-04-01 | Payer: Medicare HMO | Source: Home / Self Care | Admitting: Obstetrics and Gynecology

## 2023-04-01 DIAGNOSIS — I10 Essential (primary) hypertension: Secondary | ICD-10-CM

## 2023-04-01 DIAGNOSIS — Z01818 Encounter for other preprocedural examination: Secondary | ICD-10-CM

## 2023-04-01 SURGERY — XI ROBOTIC ASSISTED LAPAROSCOPIC HYSTERECTOMY AND SALPINGECTOMY
Anesthesia: Choice

## 2023-04-01 MED ORDER — TRIAMTERENE-HCTZ 37.5-25 MG PO TABS: 1.0000 | ORAL_TABLET | Freq: Every day | ORAL | 2 refills | Status: AC

## 2023-04-01 MED ORDER — TRIAMTERENE-HCTZ 37.5-25 MG PO TABS: ORAL_TABLET | ORAL | 0 refills | Status: DC

## 2023-04-01 NOTE — Telephone Encounter (Signed)
Do you want her to take 1 or 1.5 tab?

## 2023-04-01 NOTE — Telephone Encounter (Signed)
Opened in error

## 2023-04-01 NOTE — Telephone Encounter (Signed)
[facility] pharmacy called and need clarification on a medication.   triamterene-hydrochlorothiazide (MAXZIDE-25) 37.5-25 MG tablet    Please call [phone]

## 2023-04-03 ENCOUNTER — Emergency Department (HOSPITAL_BASED_OUTPATIENT_CLINIC_OR_DEPARTMENT_OTHER)
Admission: EM | Admit: 2023-04-03 | Discharge: 2023-04-03 | Discharge status: Against Medical Advice | Payer: Medicare HMO

## 2023-04-03 ENCOUNTER — Encounter (HOSPITAL_BASED_OUTPATIENT_CLINIC_OR_DEPARTMENT_OTHER): Payer: Self-pay | Admitting: Emergency Medicine

## 2023-04-03 ENCOUNTER — Other Ambulatory Visit: Payer: Self-pay

## 2023-04-03 DIAGNOSIS — N939 Abnormal uterine and vaginal bleeding, unspecified: Secondary | ICD-10-CM | POA: Diagnosis not present

## 2023-04-03 DIAGNOSIS — E876 Hypokalemia: Secondary | ICD-10-CM | POA: Diagnosis not present

## 2023-04-03 LAB — BASIC METABOLIC PANEL
Anion gap: 10 (ref 5–15)
BUN: 16 mg/dL (ref 8–23)
CO2: 26 mmol/L (ref 22–32)
Calcium: 9.4 mg/dL (ref 8.9–10.3)
Chloride: 101 mmol/L (ref 98–111)
Creatinine, Ser: 0.82 mg/dL (ref 0.44–1.00)
GFR, Estimated: >60 mL/min (ref >60)
Glucose, Bld: 87 mg/dL (ref 70–99)
Potassium: 3.3 mmol/L — ABNORMAL LOW (ref 3.5–5.1)
Sodium: 137 mmol/L (ref 135–145)

## 2023-04-03 LAB — URINALYSIS, ROUTINE W REFLEX MICROSCOPIC
Bilirubin Urine: NEGATIVE
Glucose, UA: NEGATIVE mg/dL
Hgb urine dipstick: NEGATIVE
Ketones, ur: NEGATIVE mg/dL
Leukocytes,Ua: NEGATIVE
Nitrite: NEGATIVE
Protein, ur: NEGATIVE mg/dL
Specific Gravity, Urine: 1.01 (ref 1.005–1.030)
pH: 6.5 (ref 5.0–8.0)

## 2023-04-03 LAB — CBC
HCT: 41.7 % (ref 36.0–46.0)
Hemoglobin: 13.6 g/dL (ref 12.0–15.0)
MCH: 29.2 pg (ref 26.0–34.0)
MCHC: 32.6 g/dL (ref 30.0–36.0)
MCV: 89.5 fL (ref 80.0–100.0)
Platelets: 253 10*3/uL (ref 150–400)
RBC: 4.66 MIL/uL (ref 3.87–5.11)
RDW: 13.6 % (ref 11.5–15.5)
WBC: 6.9 10*3/uL (ref 4.0–10.5)
nRBC: 0 % (ref 0.0–0.2)

## 2023-04-03 MED ORDER — POTASSIUM CHLORIDE CRYS ER 20 MEQ PO TBCR
20.0000 meq | EXTENDED_RELEASE_TABLET | Freq: Once | ORAL | Status: AC
  Administered 2023-04-03: 20 meq via ORAL
  Filled 2023-04-03: qty 1

## 2023-04-03 NOTE — ED Notes (Signed)
D/c paperwork reviewed with pt, including follow up care.  All questions and/or concerns addressed at time of d/c.  No further needs expressed. . Pt verbalized understanding, Ambulatory with family to ED exit, NAD.   

## 2023-04-03 NOTE — Discharge Instructions (Signed)
Please follow-up with your OB/GYN.  Return immediately if develop fevers, chills, lightheadedness, passout, worsening abdominal pain, vaginal bleeding greater than 2 pads an hour for 3 hours or any new or worsening symptoms that are concerning to you.

## 2023-04-03 NOTE — ED Triage Notes (Signed)
Recurrent vaginal bleeding , had hysterectomy scheduled 10/3 , but got canceled due to pain , had normal biopsies . Hx breast cancer . Report passing one clot .  Abd pain

## 2023-04-03 NOTE — ED Provider Notes (Signed)
[place] EMERGENCY DEPARTMENT AT [facility] [place] Provider Note   CSN: [id number] Arrival date & time: 04/03/23  1823     History  No chief complaint on file.   Kimberly Mann is a 67 y.o. female.  This is a 67 year old female presenting emergency department with vaginal bleeding.  Reports history of fibroid uterus with recent biopsy a week and a half ago on Monday.  States since that time she had some bleeding.  She started passing clots today.  Denies any fevers, chills, chest pain, shortness of breath, had some suprapubic discomfort.        Home Medications Prior to Admission medications   Medication Sig Start Date End Date Taking? Authorizing Provider  ezetimibe (ZETIA) 10 MG tablet Take 1 tablet (10 mg total) by mouth daily. 02/14/23   Sharlene Dory, DO  fluconazole (DIFLUCAN) 150 MG tablet Take 1 tablet (150 mg total) by mouth daily. 02/27/23   Sater, Pearletha Furl, MD  fluticasone Aleda Grana) 50 MCG/ACT nasal spray Use 2 spray(s) in each nostril once daily 01/07/23   Wendling, Jilda Roche, DO  hydrOXYzine (VISTARIL) 25 MG capsule Take 1 capsule (25 mg total) by mouth 3 (three) times daily as needed. 08/28/21   Sater, Pearletha Furl, MD  levocetirizine (XYZAL) 5 MG tablet TAKE 1 TABLET BY MOUTH ONCE DAILY IN THE EVENING 09/02/22   Wendling, Jilda Roche, DO  lidocaine (LIDODERM) 5 % Place 1 patch onto the skin daily. Remove & Discard patch within 12 hours or as directed by MD 01/31/23   Henderly, Britni A, PA-C  methocarbamol (ROBAXIN) 500 MG tablet Take 1 tablet (500 mg total) by mouth 2 (two) times daily. 01/31/23   Henderly, Britni A, PA-C  Multiple Vitamins-Minerals (MULTIVITAMIN ADULT PO)  03/29/15   [provider]  naproxen (NAPROSYN) 500 MG tablet Take 1 tablet (500 mg total) by mouth 2 (two) times daily. 03/29/23   Lorriane Shire, MD  oxyCODONE-acetaminophen (PERCOCET) 10-325 MG tablet Take 1 tablet by mouth every 6 (six) hours as needed for pain.  02/27/23   Sater, Pearletha Furl, MD  pantoprazole (PROTONIX) 40 MG tablet Take 1 tablet (40 mg total) by mouth daily. 06/12/21   Sharlene Dory, DO  promethazine (PHENERGAN) 12.5 MG tablet Take 1 tablet (12.5 mg total) by mouth every 6 (six) hours as needed for nausea or vomiting. 01/31/23   Henderly, Britni A, PA-C  tamoxifen (NOLVADEX) 20 MG tablet Take 20 mg by mouth daily. Patient not taking: Reported on 03/24/2023    [provider]  triamterene-hydrochlorothiazide (MAXZIDE-25) 37.5-25 MG tablet Take 1 tablet by mouth daily. 04/01/23   Sharlene Dory, DO  Vitamin D, Ergocalciferol, (DRISDOL) 1.25 MG (50000 UNIT) CAPS capsule Take 1 capsule by mouth once a week 02/03/23   Sharlene Dory, DO      Allergies    Morphine and codeine, Other, Sulfacetamide sodium, Penicillins, Statins, Sulfa antibiotics, and Morphine    Review of Systems   Review of Systems  Physical Exam Updated Vital Signs BP (!) 183/107 (BP Location: Left Arm)   Pulse 92   Temp 98.5 F (36.9 C) (Oral)   Resp 16   SpO2 98%  Physical Exam Vitals and nursing note reviewed.  Constitutional:      General: She is not in acute distress.    Appearance: She is not toxic-appearing.  Eyes:     Conjunctiva/sclera: Conjunctivae normal.  Cardiovascular:     Rate and Rhythm: Normal rate.  Pulmonary:  Effort: Pulmonary effort is normal.     Breath sounds: Normal breath sounds.  Abdominal:     General: Abdomen is flat. There is no distension.     Tenderness: There is no abdominal tenderness. There is no guarding or rebound.  Genitourinary:    Comments: Deferred due to patient preference Musculoskeletal:        General: Normal range of motion.  Skin:    General: Skin is warm and dry.     Capillary Refill: Capillary refill takes less than 2 seconds.  Neurological:     Mental Status: She is alert.  Psychiatric:        Mood and Affect: Mood normal.        Behavior: Behavior normal.      ED Results / Procedures / Treatments   Labs (all labs ordered are listed, but only abnormal results are displayed) Labs Reviewed  BASIC METABOLIC PANEL - Abnormal; Notable for the following components:      Result Value   Potassium 3.3 (*)    All other components within normal limits  CBC  URINALYSIS, ROUTINE W REFLEX MICROSCOPIC    EKG None  Radiology No results found.  Procedures Procedures    Medications Ordered in ED Medications  potassium chloride SA (KLOR-CON M) CR tablet 20 mEq (has no administration in time range)    ED Course/ Medical Decision Making/ A&P                                 Medical Decision Making Is a well-appearing 67 year old female presenting emergency department for vaginal bleeding.  She is afebrile, hypertensive and nontachycardic.  Soft benign abdominal exam.  Low suspicion for acute intra-abdominal pathology based on exam.  Basic labs with no acute blood loss anemia.  BMP with mild hypokalemia.  Repleted.  UA negative for UTI.  Considered CT abdomen, however patient is being worked up with OB/GYN outpatient for fibroid uterus and had had recent biopsy and per patient reports and chart review is to have hysterectomy. I suspect this is the cause of her bleeding today.  Discussed following up with her OB/GYN.  Patient given strict return precautions.  Stable for discharge at this time.  Amount and/or Complexity of Data Reviewed Independent Historian:     Details: family member noted patient with history of breast cancer. External Data Reviewed:     Details: Per my chart review from OB/GYN 03/24/2023: "1. Post-menopausal bleeding Now s/p attempted EMB. Small volume sample, unclear if truly endometrial or if endocervical. Discussed that if not enough sample can discuss re-sampling (in office or OR) vs proceeding with hysterectomy understanding that malignancy may be diagnosed at that time, necessitating additional treatment including possible  surgery.    2. Essential hypertension Significantly elevated BP today with visual disturbances. Advised presentation to ED for further evaluation.    3. Stress and adjustment reaction Noted significant life stressors including personal health. Offered IBH referral - declines for now and notes that she typically leans on faith during these times. Recommend utilizing any personal resources for dealing with these significant life stressors.    " Labs: ordered.  Risk Prescription drug management.          Final Clinical Impression(s) / ED Diagnoses Final diagnoses:  None    Rx / DC Orders ED Discharge Orders     None         Coral Spikes,  DO 04/03/23 2041

## 2023-04-07 ENCOUNTER — Other Ambulatory Visit: Payer: Self-pay | Admitting: Neurology

## 2023-04-07 DIAGNOSIS — M545 Low back pain, unspecified: Secondary | ICD-10-CM

## 2023-04-07 MED ORDER — OXYCODONE-ACETAMINOPHEN 10-325 MG PO TABS: 1.0000 | ORAL_TABLET | Freq: Four times a day (QID) | ORAL | 0 refills | Status: DC | PRN

## 2023-04-07 NOTE — Telephone Encounter (Signed)
Pt is requesting a refill for oxyCODONE-acetaminophen (PERCOCET) 10-325 MG tablet .  Pharmacy: [id number]   

## 2023-04-07 NOTE — Telephone Encounter (Signed)
Last seen 02/27/23 and next f/u 09/15/23. Last refilled 03/13/23 #120.

## 2023-04-27 ENCOUNTER — Other Ambulatory Visit: Payer: Self-pay | Admitting: Family Medicine

## 2023-05-02 DIAGNOSIS — M25561 Pain in right knee: Secondary | ICD-10-CM | POA: Diagnosis not present

## 2023-05-02 DIAGNOSIS — M1711 Unilateral primary osteoarthritis, right knee: Secondary | ICD-10-CM | POA: Diagnosis not present

## 2023-05-05 ENCOUNTER — Other Ambulatory Visit: Payer: Self-pay | Admitting: Neurology

## 2023-05-05 DIAGNOSIS — M545 Low back pain, unspecified: Secondary | ICD-10-CM

## 2023-05-05 MED ORDER — OXYCODONE-ACETAMINOPHEN 10-325 MG PO TABS: 1.0000 | ORAL_TABLET | Freq: Four times a day (QID) | ORAL | 0 refills | Status: DC | PRN

## 2023-05-05 NOTE — Telephone Encounter (Signed)
 Last visit: 02/27/23  Next visit: 09/15/23  Per epic registry, last fill:    Rx refill sent to Dr Vear  From last visit:   ASSESSMENT AND PLAN     1. Subacromial bursitis of both shoulders   2. Neck pain   3. Migraine without aura and without status migrainosus, not intractable   4. Chronic prescription opiate use   5. Bilateral sciatica   Get   1.   Injection of bilateral subacromial bursa with 20 mg Depo-Medrol  in 2.0 cc Marcaine  into each shoulder using sterile technique..  She tolerated the injection well and pain was better afterwards. 2.   TPI cervical paraspinal muscle and bilateral piriformis muscles with 40 mg Depo-Medrol  in 3 cc Marcaine  using sterile technique.   3.  Continue Percocet will be renewed.  [place]  controlled substance database was reviewed and she is compliant..  A postdated prescription has been sent to the pharmacy. Urine drug screen was fine earlier this year.. 4.     She has osteopenia (2021 DEXA)    We have been averaging 1 steroid injection every 4 months.    No recent injection by others this year.  She is also on Flonase  now and then for seasonal allergies.  Advised to discuss further with PCP 5.   Stay active and exercise as tolerated. 6. . She will return to see me in 4 months, sooner if she  has new or worsening neurologic symptoms.     Richard A. Vear, MD, PhD 02/27/2023, 8:51 AM Certified in Neurology, Clinical Neurophysiology, Sleep Medicine, Pain Medicine and Neuroimaging

## 2023-05-05 NOTE — Telephone Encounter (Signed)
 Pt's son requesting refill of oxyCODONE-acetaminophen (PERCOCET) 10-325 MG tablet  Send to  [facility] [phone]

## 2023-05-12 ENCOUNTER — Other Ambulatory Visit: Payer: Self-pay | Admitting: Obstetrics and Gynecology

## 2023-05-12 DIAGNOSIS — R102 Pelvic and perineal pain: Secondary | ICD-10-CM

## 2023-05-12 MED ORDER — NAPROXEN 500 MG PO TABS: 500.0000 mg | ORAL_TABLET | Freq: Two times a day (BID) | ORAL | 1 refills | Status: DC

## 2023-05-27 ENCOUNTER — Other Ambulatory Visit: Payer: Self-pay | Admitting: Family Medicine

## 2023-05-28 ENCOUNTER — Telehealth: Payer: Self-pay | Admitting: Neurology

## 2023-05-28 ENCOUNTER — Other Ambulatory Visit: Payer: Self-pay | Admitting: Neurology

## 2023-05-28 DIAGNOSIS — M545 Low back pain, unspecified: Secondary | ICD-10-CM

## 2023-05-28 MED ORDER — OXYCODONE-ACETAMINOPHEN 10-325 MG PO TABS: 1.0000 | ORAL_TABLET | Freq: Four times a day (QID) | ORAL | 0 refills | Status: DC | PRN

## 2023-05-28 NOTE — Telephone Encounter (Signed)
Pt is calling to r/s a refill on her  oxyCODONE-acetaminophen (PERCOCET) 10-325 MG tablet early due to the office being being a short day on Friday. Pt would just like it put in so that she is able to pick it up on time. Please advise.

## 2023-05-28 NOTE — Telephone Encounter (Signed)
..  Pt understands that although there may be some limitations with this type of visit, we will take all precautions to reduce any security or privacy concerns.  Pt understands that this will be treated like an in office visit and we will file with pt's insurance, and there may be a patient responsible charge related to this service. ? ?

## 2023-05-28 NOTE — Telephone Encounter (Signed)
Last seen 02/27/23 and next f/u 09/15/23. Last refilled oxycodone 05/08/23 #120.  I called pt. Explained refill too soon. She is wanting to know if Dr. Epimenio Foot will call in and just put fill on or after date. She is afraid she will forget to request refill next week.  Offered sooner appt 07/02/23 at 9:30am. She is going to see if son can bring her. She will call back tomorrow to let us know if this works.

## 2023-06-04 ENCOUNTER — Telehealth: Payer: Self-pay | Admitting: Neurology

## 2023-06-04 DIAGNOSIS — M545 Low back pain, unspecified: Secondary | ICD-10-CM

## 2023-06-04 NOTE — Telephone Encounter (Signed)
 Pt is needing a refill on her  oxyCODONE -acetaminophen  (PERCOCET) 10-325 MG tablet and sent in to the [facility] on [address]

## 2023-06-04 NOTE — Telephone Encounter (Signed)
 Last seen 02/27/23 and next f/u 07/02/23. Last refilled 05/08/23 #120.  Refill already sent on 05/28/23 for her to fill on or after 06/06/23. I called and updated pt. She verbalized understanding.  She also asked that 07/02/23 appt be changed from mychart VV to in office visit. I updated this.

## 2023-06-24 DIAGNOSIS — L308 Other specified dermatitis: Secondary | ICD-10-CM | POA: Diagnosis not present

## 2023-07-02 ENCOUNTER — Ambulatory Visit (INDEPENDENT_AMBULATORY_CARE_PROVIDER_SITE_OTHER): Payer: Medicare HMO | Admitting: Neurology

## 2023-07-02 ENCOUNTER — Encounter: Payer: Self-pay | Admitting: Neurology

## 2023-07-02 ENCOUNTER — Telehealth: Payer: Self-pay | Admitting: Neurology

## 2023-07-02 VITALS — BP 157/87 | HR 86 | Ht 65.0 in | Wt 227.5 lb

## 2023-07-02 DIAGNOSIS — M7551 Bursitis of right shoulder: Secondary | ICD-10-CM

## 2023-07-02 DIAGNOSIS — M5431 Sciatica, right side: Secondary | ICD-10-CM

## 2023-07-02 DIAGNOSIS — M7552 Bursitis of left shoulder: Secondary | ICD-10-CM | POA: Diagnosis not present

## 2023-07-02 DIAGNOSIS — Z79891 Long term (current) use of opiate analgesic: Secondary | ICD-10-CM

## 2023-07-02 DIAGNOSIS — M545 Low back pain, unspecified: Secondary | ICD-10-CM | POA: Diagnosis not present

## 2023-07-02 DIAGNOSIS — M542 Cervicalgia: Secondary | ICD-10-CM

## 2023-07-02 DIAGNOSIS — H539 Unspecified visual disturbance: Secondary | ICD-10-CM

## 2023-07-02 DIAGNOSIS — G43009 Migraine without aura, not intractable, without status migrainosus: Secondary | ICD-10-CM

## 2023-07-02 DIAGNOSIS — M5432 Sciatica, left side: Secondary | ICD-10-CM

## 2023-07-02 MED ORDER — FLUCONAZOLE 150 MG PO TABS: 150.0000 mg | ORAL_TABLET | Freq: Every day | ORAL | 0 refills | Status: DC

## 2023-07-02 MED ORDER — HYDROXYZINE PAMOATE 25 MG PO CAPS: 25.0000 mg | ORAL_CAPSULE | Freq: Three times a day (TID) | ORAL | 5 refills | Status: AC | PRN

## 2023-07-02 MED ORDER — OXYCODONE-ACETAMINOPHEN 10-325 MG PO TABS: 1.0000 | ORAL_TABLET | Freq: Four times a day (QID) | ORAL | 0 refills | Status: DC | PRN

## 2023-07-02 NOTE — Telephone Encounter (Signed)
Per todays visit

## 2023-07-02 NOTE — Telephone Encounter (Signed)
 Called pharmacy back and spoke w/ Asher Muir. Rx'd for chronic neck, back and sciatica pain. She verbalized understanding and appreciation.
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
## 2023-07-02 NOTE — Telephone Encounter (Signed)
 [facility] (Jamie)Do you have office notes with patient diagnosis or reason patient taking pain oxyCODONE-acetaminophen (PERCOCET) 10-325 MG tablet?

## 2023-07-10 ENCOUNTER — Telehealth: Payer: Self-pay | Admitting: Neurology

## 2023-07-10 NOTE — Telephone Encounter (Signed)
 Ethlyn Gallery: [id number] exp. 07/10/23-09/08/23 sent to GI [phone]

## 2023-07-12 ENCOUNTER — Other Ambulatory Visit: Payer: Self-pay | Admitting: Neurology

## 2023-07-19 ENCOUNTER — Other Ambulatory Visit: Payer: Self-pay | Admitting: Family Medicine

## 2023-08-01 ENCOUNTER — Other Ambulatory Visit: Payer: Self-pay | Admitting: Neurology

## 2023-08-01 DIAGNOSIS — M545 Low back pain, unspecified: Secondary | ICD-10-CM

## 2023-08-01 MED ORDER — OXYCODONE-ACETAMINOPHEN 10-325 MG PO TABS: 1.0000 | ORAL_TABLET | Freq: Four times a day (QID) | ORAL | 0 refills | Status: DC | PRN

## 2023-08-01 NOTE — Telephone Encounter (Signed)
 Requested Prescriptions   Pending Prescriptions Disp Refills   oxyCODONE-acetaminophen (PERCOCET) 10-325 MG tablet 120 tablet 0    Sig: Take 1 tablet by mouth every 6 (six) hours as needed for pain.   Last seen 07/02/23, next appt 09/15/23 Dispenses   Dispensed Days Supply Quantity Provider Pharmacy  OXYCOD/ACETAM 10-325MG  TAB 07/05/2023 30 120 each Sater, Pearletha Furl, MD [facility] [phone] ...  OXYCOD/ACETAM 10-325MG  TAB 06/05/2023 30 120 each Sater, Pearletha Furl, MD [facility] [phone] ...  OXYCOD/ACETAM 10-325MG  TAB 05/08/2023 30 120 each Sater, Pearletha Furl, MD [facility] [phone] ...  OXYCOD/ACETAM 10-325MG  TAB 04/10/2023 30 120 each Sater, Pearletha Furl, MD [facility] [phone] ...  OXYCOD/ACETAM 10-325MG  TAB 03/13/2023 30 120 each Sater, Pearletha Furl, MD [facility] [phone] ...  OXYCOD/ACETAM 10-325MG  TAB 02/07/2023 30 120 each Sater, Pearletha Furl, MD [facility] [phone] ...  OXYCOD/ACETAM 10-325MG  TAB 01/10/2023 30 120 each Huston Foley, MD [facility] [phone] ...  OXYCOD/ACETAM 10-325MG  TAB 12/05/2022 30 120 each Sater, Pearletha Furl, MD [facility] [phone] ...  OXYCOD/ACETAM 10-325MG  TAB 11/04/2022 30 120 each Sater, Pearletha Furl, MD [facility] [phone] ...  OXYCOD/ACETAM 10-325MG  TAB 10/07/2022 30 120 each Sater, Pearletha Furl, MD [facility] [phone] ...  OXYCOD/ACETAM 10-325MG  TAB 09/07/2022 30 120 each Anson Fret, MD [facility] [phone] ...  OXYCOD/ACETAM 10-325MG  TAB 08/09/2022 30 120 each Sater, Pearletha Furl, MD [facility] [phone] .Marland KitchenMarland Kitchen

## 2023-08-01 NOTE — Telephone Encounter (Signed)
Pt is needing a refill on her oxyCODONE-acetaminophen (PERCOCET) 10-325 MG tablet sent to the [facility] on [address] 

## 2023-08-07 ENCOUNTER — Telehealth: Payer: Self-pay

## 2023-08-07 NOTE — Progress Notes (Signed)
 Subjective:   Kimberly Mann is a 68 y.o. who presents for a Medicare Wellness preventive visit.  Visit Complete:     Persons Participating in Visit:   AWV Questionnaire:         Objective:    There were no vitals filed for this visit. There is no height or weight on file to calculate BMI.     04/03/2023    6:35 PM 03/24/2023    3:42 PM 01/05/2023    1:59 PM 10/13/2022    3:03 PM 07/23/2022    2:41 PM 04/08/2022    2:07 PM 06/05/2021   10:23 AM  Advanced Directives  Does Patient Have a Medical Advance Directive? No No No No No No No  Would patient like information on creating a medical advance directive?  No - Patient declined   No - Patient declined No - Patient declined No - Patient declined    Current Medications (verified) Outpatient Encounter Medications as of 08/07/2023  Medication Sig   ezetimibe (ZETIA) 10 MG tablet Take 1 tablet by mouth once daily   fluconazole (DIFLUCAN) 150 MG tablet Take 1 tablet by mouth once daily   fluticasone (FLONASE) 50 MCG/ACT nasal spray Use 2 spray(s) in each nostril once daily   hydrOXYzine (VISTARIL) 25 MG capsule Take 1 capsule (25 mg total) by mouth 3 (three) times daily as needed.   levocetirizine (XYZAL) 5 MG tablet TAKE 1 TABLET BY MOUTH ONCE DAILY IN THE EVENING   lidocaine (LIDODERM) 5 % Place 1 patch onto the skin daily. Remove & Discard patch within 12 hours or as directed by MD   methocarbamol (ROBAXIN) 500 MG tablet Take 1 tablet (500 mg total) by mouth 2 (two) times daily.   Multiple Vitamins-Minerals (MULTIVITAMIN ADULT PO)    naproxen (NAPROSYN) 500 MG tablet Take 1 tablet (500 mg total) by mouth 2 (two) times daily.   oxyCODONE-acetaminophen (PERCOCET) 10-325 MG tablet Take 1 tablet by mouth every 6 (six) hours as needed for pain.   pantoprazole (PROTONIX) 40 MG tablet Take 1 tablet (40 mg total) by mouth daily.   promethazine (PHENERGAN) 12.5 MG tablet Take 1 tablet (12.5 mg total) by mouth every 6 (six) hours as  needed for nausea or vomiting.   triamterene-hydrochlorothiazide (MAXZIDE-25) 37.5-25 MG tablet Take 1 tablet by mouth daily.   Vitamin D, Ergocalciferol, (DRISDOL) 1.25 MG (50000 UNIT) CAPS capsule Take 1 capsule by mouth once a week   No facility-administered encounter medications on file as of 08/07/2023.    Allergies (verified) Morphine and codeine, Other, Sulfacetamide sodium, Penicillins, Statins, Sulfa antibiotics, and Morphine   History: Past Medical History:  Diagnosis Date   Anxiety    Cancer (HCC)    breast   Chronic back pain    takes oxycodone   Complication of anesthesia    Eczema    Environmental allergies    Headache    Hypertension    PONV (postoperative nausea and vomiting)    Vertigo    Past Surgical History:  Procedure Laterality Date   BREAST REDUCTION SURGERY Left 11/20/2017   Procedure: left breast reduction for symmetry and liposuction bilateral lateral breasts;  Surgeon: Peggye Form, DO;  Location: [facility];  Service: Plastics;  Laterality: Left;   BREAST SURGERY     rt breast lumpectomy   HYSTEROSCOPY WITH D & C N/A 06/05/2021   Procedure: DILATATION AND CURETTAGE /HYSTEROSCOPY/MYOSURE RESECTION OF UTERINE FIBROIDS;  Surgeon: Willodean Rosenthal, MD;  Location: [facility];  Service: Gynecology;  Laterality: N/A;   LIPOSUCTION     TUBAL LIGATION     Family History  Problem Relation Age of Onset   Hypertension Mother    Social History   Socioeconomic History   Marital status: Legally Separated    Spouse name: Not on file   Number of children: Not on file   Years of education: Not on file   Highest education level: Not on file  Occupational History   Not on file  Tobacco Use   Smoking status: Former    Current packs/day: 0.00    Types: Cigarettes    Quit date: 05/17/1974    Years since quitting: 49.2   Smokeless tobacco: Never  Vaping Use   Vaping status: Never Used  Substance and Sexual  Activity   Alcohol use: Not Currently    Alcohol/week: 7.0 standard drinks of alcohol    Types: 7 Standard drinks or equivalent per week   Drug use: Not Currently    Types: Marijuana    Comment: Last use 06/04/21   Sexual activity: Not on file  Other Topics Concern   Not on file  Social History Narrative   Not on file   Social Drivers of Health   Financial Resource Strain: Low Risk  (02/01/2021)   Overall Financial Resource Strain (CARDIA)    Difficulty of Paying Living Expenses: Not hard at all  Food Insecurity: Low Risk  (12/20/2022)   Received from [facility]   Hunger Vital Sign    Worried About Running Out of Food in the Last Year: Never true    Ran Out of Food in the Last Year: Never true  Transportation Needs: No Transportation Needs (12/20/2022)   Received from [facility]    In the past 12 months, has lack of reliable transportation kept you from medical appointments, meetings, work or from getting things needed for daily living? : No  Physical Activity: Inactive (02/01/2021)   Exercise Vital Sign    Days of Exercise per Week: 0 days    Minutes of Exercise per Session: 0 min  Stress: No Stress Concern Present (02/01/2021)   [employer] of Occupational Health - Occupational Stress Questionnaire    Feeling of Stress : Not at all  Social Connections: Moderately Isolated (02/01/2021)   Social Connection and Isolation Panel [NHANES]    Frequency of Communication with Friends and Family: More than three times a week    Frequency of Social Gatherings with Friends and Family: More than three times a week    Attends Religious Services: More than 4 times per year    Active Member of [employer] or Organizations: No    Attends Banker Meetings: Never    Marital Status: Separated    Tobacco Counseling Counseling given: Not Answered    Clinical Intake:              Lab Results  Component Value Date   HGBA1C 5.8 02/12/2023   HGBA1C  5.7 06/18/2022   HGBA1C 5.8 12/14/2021               Activities of Daily Living      No data to display          Patient Care Team: Sharlene Dory, DO as PCP - General (Family Medicine)  Indicate any recent Medical Services you may have received from other than [facility] providers in the past year (date may be approximate).  Assessment:   This is a routine wellness examination for Kimberly Mann.  Hearing/Vision screen No results found.   Goals Addressed   None    Depression Screen     02/12/2023   12:51 PM 07/23/2022    2:28 PM 06/18/2022   11:13 AM 02/01/2021   11:52 AM 12/01/2020    9:10 AM 11/02/2019    9:59 AM  PHQ 2/9 Scores  PHQ - 2 Score 0 1 2 0 0 0  PHQ- 9 Score 2  6       Fall Risk     07/23/2022    2:25 PM 06/18/2022   11:13 AM 02/01/2021   11:50 AM  Fall Risk   Falls in the past year? 1 0 1  Comment from vertigo    Number falls in past yr: 0 0 1  Injury with Fall? 0 0 0  Risk for fall due to : History of fall(s) No Fall Risks History of fall(s)  Follow up Falls evaluation completed Falls evaluation completed Falls prevention discussed    MEDICARE RISK AT HOME:     TIMED UP AND GO:  Was the test performed?    Cognitive Function:         07/23/2022    2:38 PM  6CIT Screen  What Year? 0 points  What month? 0 points  What time? 0 points  Count back from 20 0 points  Months in reverse 4 points  Repeat phrase 2 points  Total Score 6 points    Immunizations Immunization History  Administered Date(s) Administered   Moderna Sars-Covid-2 Vaccination 08/31/2019, 09/28/2019   Pneumococcal Polysaccharide-23 11/02/2019   Tdap 07/23/2017    Screening Tests Health Maintenance  Topic Date Due   Medicare Annual Wellness (AWV)  07/23/2023   INFLUENZA VACCINE  11/28/2023   DTaP/Tdap/Td (2 - Td or Tdap) 07/24/2027   Colonoscopy  03/01/2029   DEXA SCAN  Completed   Hepatitis C Screening  Completed   HPV VACCINES  Aged Out    Meningococcal B Vaccine  Aged Out   Pneumonia Vaccine 86+ Years old  Discontinued   MAMMOGRAM  Discontinued   COVID-19 Vaccine  Discontinued   Zoster Vaccines- Shingrix  Discontinued    Health Maintenance  Health Maintenance Due  Topic Date Due   Medicare Annual Wellness (AWV)  07/23/2023   Health Maintenance Items Addressed:   Additional Screening:  Vision Screening: Recommended annual ophthalmology exams for early detection of glaucoma and other disorders of the eye.  Dental Screening: Recommended annual dental exams for proper oral hygiene  Community Resource Referral / Chronic Care Management: CRR required this visit?    CCM required this visit?       Plan:     I have personally reviewed and noted the following in the patient's chart:   Medical and social history Use of alcohol, tobacco or illicit drugs  Current medications and supplements including opioid prescriptions.  Functional ability and status Nutritional status Physical activity Advanced directives List of other physicians Hospitalizations, surgeries, and ER visits in previous 12 months Vitals Screenings to include cognitive, depression, and falls Referrals and appointments  In addition, I have reviewed and discussed with patient certain preventive protocols, quality metrics, and best practice recommendations. A written personalized care plan for preventive services as well as general preventive health recommendations were provided to patient.     Tillie Rung, LPN   1/61/0960   After Visit Summary:   Notes: This encounter was created in  error - please disregard.

## 2023-08-07 NOTE — Telephone Encounter (Signed)
Unsuccessful attempt to reach patient on preferred number listed in notes for scheduled AWV. Left message on voicemail okay to reschedule. 

## 2023-08-13 DIAGNOSIS — H40033 Anatomical narrow angle, bilateral: Secondary | ICD-10-CM | POA: Diagnosis not present

## 2023-08-13 DIAGNOSIS — H02831 Dermatochalasis of right upper eyelid: Secondary | ICD-10-CM | POA: Diagnosis not present

## 2023-08-13 DIAGNOSIS — H2513 Age-related nuclear cataract, bilateral: Secondary | ICD-10-CM | POA: Diagnosis not present

## 2023-08-13 DIAGNOSIS — H5203 Hypermetropia, bilateral: Secondary | ICD-10-CM | POA: Diagnosis not present

## 2023-08-13 DIAGNOSIS — H43811 Vitreous degeneration, right eye: Secondary | ICD-10-CM | POA: Diagnosis not present

## 2023-08-13 DIAGNOSIS — H524 Presbyopia: Secondary | ICD-10-CM | POA: Diagnosis not present

## 2023-08-13 DIAGNOSIS — H04123 Dry eye syndrome of bilateral lacrimal glands: Secondary | ICD-10-CM | POA: Diagnosis not present

## 2023-08-13 DIAGNOSIS — H25013 Cortical age-related cataract, bilateral: Secondary | ICD-10-CM | POA: Diagnosis not present

## 2023-08-13 DIAGNOSIS — H52203 Unspecified astigmatism, bilateral: Secondary | ICD-10-CM | POA: Diagnosis not present

## 2023-08-15 ENCOUNTER — Encounter: Payer: Medicare HMO | Admitting: Family Medicine

## 2023-08-25 ENCOUNTER — Encounter: Payer: Medicare HMO | Admitting: Family Medicine

## 2023-08-25 ENCOUNTER — Other Ambulatory Visit: Payer: Self-pay

## 2023-08-25 ENCOUNTER — Encounter: Payer: Self-pay | Admitting: Family Medicine

## 2023-08-25 ENCOUNTER — Other Ambulatory Visit: Payer: Self-pay | Admitting: Family Medicine

## 2023-08-25 ENCOUNTER — Ambulatory Visit (INDEPENDENT_AMBULATORY_CARE_PROVIDER_SITE_OTHER): Payer: Medicare HMO | Admitting: Family Medicine

## 2023-08-25 VITALS — BP 126/74 | HR 95 | Temp 98.0°F | Resp 16 | Ht 65.0 in | Wt 224.4 lb

## 2023-08-25 DIAGNOSIS — L209 Atopic dermatitis, unspecified: Secondary | ICD-10-CM

## 2023-08-25 DIAGNOSIS — M25561 Pain in right knee: Secondary | ICD-10-CM | POA: Diagnosis not present

## 2023-08-25 DIAGNOSIS — E559 Vitamin D deficiency, unspecified: Secondary | ICD-10-CM | POA: Diagnosis not present

## 2023-08-25 DIAGNOSIS — R7303 Prediabetes: Secondary | ICD-10-CM | POA: Diagnosis not present

## 2023-08-25 DIAGNOSIS — I10 Essential (primary) hypertension: Secondary | ICD-10-CM | POA: Diagnosis not present

## 2023-08-25 DIAGNOSIS — J069 Acute upper respiratory infection, unspecified: Secondary | ICD-10-CM

## 2023-08-25 DIAGNOSIS — E781 Pure hyperglyceridemia: Secondary | ICD-10-CM

## 2023-08-25 DIAGNOSIS — M1711 Unilateral primary osteoarthritis, right knee: Secondary | ICD-10-CM | POA: Diagnosis not present

## 2023-08-25 DIAGNOSIS — Z0000 Encounter for general adult medical examination without abnormal findings: Secondary | ICD-10-CM | POA: Diagnosis not present

## 2023-08-25 DIAGNOSIS — Z6836 Body mass index (BMI) 36.0-36.9, adult: Secondary | ICD-10-CM | POA: Diagnosis not present

## 2023-08-25 DIAGNOSIS — J302 Other seasonal allergic rhinitis: Secondary | ICD-10-CM

## 2023-08-25 DIAGNOSIS — E782 Mixed hyperlipidemia: Secondary | ICD-10-CM

## 2023-08-25 LAB — COMPREHENSIVE METABOLIC PANEL WITH GFR
ALT: 21 U/L (ref 0–35)
AST: 18 U/L (ref 0–37)
Albumin: 4.3 g/dL (ref 3.5–5.2)
Alkaline Phosphatase: 66 U/L (ref 39–117)
BUN: 20 mg/dL (ref 6–23)
CO2: 26 meq/L (ref 19–32)
Calcium: 9.3 mg/dL (ref 8.4–10.5)
Chloride: 101 meq/L (ref 96–112)
Creatinine, Ser: 0.92 mg/dL (ref 0.40–1.20)
GFR: 63.7 mL/min (ref >60.00)
Glucose, Bld: 95 mg/dL (ref 70–99)
Potassium: 3.4 meq/L — ABNORMAL LOW (ref 3.5–5.1)
Sodium: 139 meq/L (ref 135–145)
Total Bilirubin: 0.3 mg/dL (ref 0.2–1.2)
Total Protein: 7.2 g/dL (ref 6.0–8.3)

## 2023-08-25 LAB — CBC
HCT: 40.6 % (ref 36.0–46.0)
Hemoglobin: 13.3 g/dL (ref 12.0–15.0)
MCHC: 32.7 g/dL (ref 30.0–36.0)
MCV: 90.8 fl (ref 78.0–100.0)
Platelets: 272 10*3/uL (ref 150.0–400.0)
RBC: 4.47 Mil/uL (ref 3.87–5.11)
RDW: 14.7 % (ref 11.5–15.5)
WBC: 6.1 10*3/uL (ref 4.0–10.5)

## 2023-08-25 LAB — VITAMIN D 25 HYDROXY (VIT D DEFICIENCY, FRACTURES): VITD: 51.86 ng/mL (ref 30.00–100.00)

## 2023-08-25 LAB — LIPID PANEL
Cholesterol: 254 mg/dL — ABNORMAL HIGH (ref 0–200)
HDL: 54 mg/dL (ref >39.00)
LDL Cholesterol: 159 mg/dL — ABNORMAL HIGH (ref 0–99)
NonHDL: 199.5
Total CHOL/HDL Ratio: 5
Triglycerides: 203 mg/dL — ABNORMAL HIGH (ref 0.0–149.0)
VLDL: 40.6 mg/dL — ABNORMAL HIGH (ref 0.0–40.0)

## 2023-08-25 LAB — HEMOGLOBIN A1C: Hgb A1c MFr Bld: 6 % (ref 4.6–6.5)

## 2023-08-25 MED ORDER — FENOFIBRATE 48 MG PO TABS: 48.0000 mg | ORAL_TABLET | Freq: Every day | ORAL | 3 refills | Status: DC

## 2023-08-25 MED ORDER — BENZONATATE 200 MG PO CAPS: 200.0000 mg | ORAL_CAPSULE | Freq: Two times a day (BID) | ORAL | 0 refills | Status: DC | PRN

## 2023-08-25 MED ORDER — POTASSIUM CHLORIDE CRYS ER 10 MEQ PO TBCR
10.0000 meq | EXTENDED_RELEASE_TABLET | Freq: Two times a day (BID) | ORAL | 0 refills | Status: AC
Start: 2023-08-25 — End: 2024-02-05

## 2023-08-25 NOTE — Patient Instructions (Addendum)
 If you do not hear anything about your referral in the next 1-2 weeks, call our office and ask for an update.  Give us  2-3 business days to get the results of your labs back.   Keep the diet clean and stay active.  Please consider the pneumonia vaccination.   The Shingrix vaccine (for shingles) is a 2 shot series spaced 2-6 months apart. It can make people feel low energy, achy and almost like they have the flu for 48 hours after injection. 1/5 people can have nausea and/or vomiting. Please plan accordingly when deciding on when to get this shot. Call your pharmacy to get this. The second shot of the series is less severe regarding the side effects, but it still lasts 48 hours.   Let us  know if you need anything.

## 2023-08-25 NOTE — Progress Notes (Signed)
 Chief Complaint  Patient presents with   Annual Exam    CPE     Well Woman Kimberly Mann is here for a complete physical.   Her last physical was >1 year ago.  Current diet: in general, diet is fair. Current exercise: walking Weight is stable and she denies daytime fatigue. Seatbelt? Yes Advanced directive? No  Health Maintenance Colonoscopy- Yes Shingrix- No DEXA- Yes Mammogram- Yes Tetanus- Yes Pneumonia- No Hep C screen- Yes  Cough Duration: 1 week  Associated symptoms: sinus congestion, rhinorrhea, and coughing Denies: sinus pain, itchy watery eyes, ear pain, ear drainage, sore throat, wheezing, shortness of breath, myalgia, and fevers Treatment to date: Mucinex Sick contacts: No  Past Medical History:  Diagnosis Date   Anxiety    Cancer (HCC)    breast   Chronic back pain    takes oxycodone    Complication of anesthesia    Eczema    Environmental allergies    Headache    Hypertension    PONV (postoperative nausea and vomiting)    Vertigo      Past Surgical History:  Procedure Laterality Date   BREAST REDUCTION SURGERY Left 11/20/2017   Procedure: left breast reduction for symmetry and liposuction bilateral lateral breasts;  Surgeon: Thornell Flirt, DO;  Location: [facility];  Service: Plastics;  Laterality: Left;   BREAST SURGERY     rt breast lumpectomy   HYSTEROSCOPY WITH D & C N/A 06/05/2021   Procedure: DILATATION AND CURETTAGE /HYSTEROSCOPY/MYOSURE RESECTION OF UTERINE FIBROIDS;  Surgeon: Lenord Radon, MD;  Location: [facility];  Service: Gynecology;  Laterality: N/A;   LIPOSUCTION     TUBAL LIGATION      Medications  Current Outpatient Medications on File Prior to Visit  Medication Sig Dispense Refill   ezetimibe  (ZETIA ) 10 MG tablet Take 1 tablet by mouth once daily 90 tablet 0   fluconazole  (DIFLUCAN ) 150 MG tablet Take 1 tablet by mouth once daily 30 tablet 5   fluticasone  (FLONASE ) 50  MCG/ACT nasal spray Use 2 spray(s) in each nostril once daily 16 g 0   hydrOXYzine  (VISTARIL ) 25 MG capsule Take 1 capsule (25 mg total) by mouth 3 (three) times daily as needed. 60 capsule 5   levocetirizine (XYZAL ) 5 MG tablet TAKE 1 TABLET BY MOUTH ONCE DAILY IN THE EVENING 90 tablet 0   lidocaine  (LIDODERM ) 5 % Place 1 patch onto the skin daily. Remove & Discard patch within 12 hours or as directed by MD 30 patch 0   methocarbamol  (ROBAXIN ) 500 MG tablet Take 1 tablet (500 mg total) by mouth 2 (two) times daily. 20 tablet 0   Multiple Vitamins-Minerals (MULTIVITAMIN ADULT PO)      naproxen  (NAPROSYN ) 500 MG tablet Take 1 tablet (500 mg total) by mouth 2 (two) times daily. 60 tablet 1   oxyCODONE -acetaminophen  (PERCOCET) 10-325 MG tablet Take 1 tablet by mouth every 6 (six) hours as needed for pain. 120 tablet 0   pantoprazole  (PROTONIX ) 40 MG tablet Take 1 tablet (40 mg total) by mouth daily. 30 tablet 3   promethazine  (PHENERGAN ) 12.5 MG tablet Take 1 tablet (12.5 mg total) by mouth every 6 (six) hours as needed for nausea or vomiting. 15 tablet 0   triamterene -hydrochlorothiazide (MAXZIDE-25) 37.5-25 MG tablet Take 1 tablet by mouth daily. 90 tablet 2   Vitamin D , Ergocalciferol , (DRISDOL ) 1.25 MG (50000 UNIT) CAPS capsule Take 1 capsule by mouth once a week 12 capsule 0  Allergies Allergies  Allergen Reactions   Morphine And Codeine Nausea And Vomiting and Swelling   Other Other (See Comments) and Swelling   Sulfacetamide Sodium Itching and Swelling   Penicillins    Statins Other (See Comments)    Myalgias   Sulfa Antibiotics    Morphine Other (See Comments)    Severe, Makes feel crazy    Review of Systems: Constitutional:  no fevers Eye:  no recent significant change in vision Ears:  No changes in hearing Nose/Mouth/Throat:  no complaints of nasal congestion, no sore throat Cardiovascular: no chest pain Respiratory:  No shortness of breath, + cough Gastrointestinal:  No  change in bowel habits GU:  Female: negative for dysuria Integumentary:  no abnormal skin lesions reported Neurologic:  no headaches Endocrine:  denies unexplained weight changes  Exam BP 126/74 (BP Location: Left Arm, Patient Position: Sitting)   Pulse 95   Temp 98 F (36.7 C) (Oral)   Resp 16   Ht 5\' 5"  (1.651 m)   Wt 224 lb 6.4 oz (101.8 kg)   SpO2 98%   BMI 37.34 kg/m  General:  well developed, well nourished, in no apparent distress Skin:  no significant moles, warts, or growths Head:  no masses, lesions, or tenderness Eyes:  pupils equal and round, sclera anicteric without injection Ears:  canals without lesions, TMs shiny without retraction, no obvious effusion, no erythema Nose:  nares patent, mucosa normal, and no drainage Throat/Pharynx:  lips and gingiva without lesion; tongue and uvula midline; non-inflamed pharynx; no exudates or postnasal drainage Neck: neck supple without adenopathy, thyromegaly, or masses Lungs:  clear to auscultation, breath sounds equal bilaterally, no respiratory distress Cardio:  regular rate and rhythm, no bruits or LE edema Abdomen:  abdomen soft, nontender; bowel sounds normal; no masses or organomegaly Genital: Deferred Neuro:  gait normal; deep tendon reflexes normal and symmetric Psych: well oriented with normal range of affect and appropriate judgment/insight  Assessment and Plan  Well adult exam  Atopic dermatitis, unspecified type - Plan: Ambulatory referral to Allergy   Viral URI with cough - Plan: benzonatate  (TESSALON ) 200 MG capsule  Essential hypertension - Plan: CBC, Comprehensive metabolic panel with GFR, Lipid panel  Prediabetes - Plan: Hemoglobin A1c  Vitamin D  deficiency - Plan: VITAMIN D  25 Hydroxy (Vit-D Deficiency, Fractures)   Well 68 y.o. female. Counseled on diet and exercise. Advanced directive form provided today.  PCV20 rec'd. Shingrix rec'd.  Politely declines today. URI: tessalon  Perles, seems like  things are steadily improving. Push fluids, hand hygiene, rest. Cont Mucinex if helpful.  Other orders as above. Follow up in 6 mo. The patient voiced understanding and agreement to the plan.  Shellie Dials [place], DO 08/25/23 10:51 AM

## 2023-08-26 ENCOUNTER — Other Ambulatory Visit: Payer: Self-pay

## 2023-08-26 DIAGNOSIS — J302 Other seasonal allergic rhinitis: Secondary | ICD-10-CM

## 2023-08-26 MED ORDER — FLUTICASONE PROPIONATE 50 MCG/ACT NA SUSP: 2.0000 | Freq: Every day | NASAL | 0 refills | Status: DC

## 2023-08-26 NOTE — Telephone Encounter (Signed)
 Labs ordered and called pt appt scheduled.

## 2023-08-27 ENCOUNTER — Other Ambulatory Visit: Payer: Self-pay

## 2023-08-27 DIAGNOSIS — J302 Other seasonal allergic rhinitis: Secondary | ICD-10-CM

## 2023-08-27 MED ORDER — FLUTICASONE PROPIONATE 50 MCG/ACT NA SUSP: 2.0000 | Freq: Every day | NASAL | 0 refills | Status: AC

## 2023-08-28 ENCOUNTER — Other Ambulatory Visit: Payer: Self-pay | Admitting: Neurology

## 2023-08-28 DIAGNOSIS — M545 Low back pain, unspecified: Secondary | ICD-10-CM

## 2023-08-28 MED ORDER — OXYCODONE-ACETAMINOPHEN 10-325 MG PO TABS: 1.0000 | ORAL_TABLET | Freq: Four times a day (QID) | ORAL | 0 refills | Status: DC | PRN

## 2023-08-28 NOTE — Telephone Encounter (Signed)
 Pt is needing a refill on her oxyCODONE -acetaminophen  (PERCOCET) 10-325 MG tablet and is needing it sent to the [place] [facility].

## 2023-08-28 NOTE — Telephone Encounter (Signed)
 Last seen on 02/27/23 Follow up scheduled on 09/15/23   Dispensed Days Supply Quantity Provider Pharmacy  OXYCOD/ACETAM 10-325MG  TAB 08/04/2023 30 120 each Sater, Sherida Dimmer, MD [facility] [phone] ...    Rx pending to be signed

## 2023-08-29 ENCOUNTER — Other Ambulatory Visit: Payer: Self-pay | Admitting: Family Medicine

## 2023-09-03 ENCOUNTER — Other Ambulatory Visit: Payer: Self-pay

## 2023-09-03 ENCOUNTER — Other Ambulatory Visit (INDEPENDENT_AMBULATORY_CARE_PROVIDER_SITE_OTHER)

## 2023-09-03 ENCOUNTER — Telehealth: Payer: Self-pay | Admitting: Neurology

## 2023-09-03 ENCOUNTER — Encounter: Payer: Self-pay | Admitting: Family Medicine

## 2023-09-03 DIAGNOSIS — E782 Mixed hyperlipidemia: Secondary | ICD-10-CM

## 2023-09-03 DIAGNOSIS — E781 Pure hyperglyceridemia: Secondary | ICD-10-CM

## 2023-09-03 LAB — HEPATIC FUNCTION PANEL
ALT: 18 U/L (ref 0–35)
AST: 15 U/L (ref 0–37)
Albumin: 4.5 g/dL (ref 3.5–5.2)
Alkaline Phosphatase: 65 U/L (ref 39–117)
Bilirubin, Direct: 0.1 mg/dL (ref 0.0–0.3)
Total Bilirubin: 0.4 mg/dL (ref 0.2–1.2)
Total Protein: 7.3 g/dL (ref 6.0–8.3)

## 2023-09-03 LAB — LIPID PANEL
Cholesterol: 253 mg/dL — ABNORMAL HIGH (ref 0–200)
HDL: 67.9 mg/dL (ref >39.00)
LDL Cholesterol: 170 mg/dL — ABNORMAL HIGH (ref 0–99)
NonHDL: 184.69
Total CHOL/HDL Ratio: 4
Triglycerides: 75 mg/dL (ref 0.0–149.0)
VLDL: 15 mg/dL (ref 0.0–40.0)

## 2023-09-03 LAB — MAGNESIUM: Magnesium: 2.3 mg/dL (ref 1.5–2.5)

## 2023-09-03 NOTE — Telephone Encounter (Signed)
 Appointment details confirmed

## 2023-09-04 LAB — BASIC METABOLIC PANEL WITH GFR
BUN: 22 mg/dL (ref 7–25)
CO2: 26 mmol/L (ref 20–32)
Calcium: 9.5 mg/dL (ref 8.6–10.4)
Chloride: 104 mmol/L (ref 98–110)
Creat: 1.05 mg/dL (ref 0.50–1.05)
Glucose, Bld: 87 mg/dL (ref 65–99)
Potassium: 3.7 mmol/L (ref 3.5–5.3)
Sodium: 141 mmol/L (ref 135–146)
eGFR: 58 mL/min/{1.73_m2} — ABNORMAL LOW (ref >=60)

## 2023-09-10 ENCOUNTER — Encounter: Payer: Self-pay | Admitting: Family Medicine

## 2023-09-10 DIAGNOSIS — E782 Mixed hyperlipidemia: Secondary | ICD-10-CM

## 2023-09-15 ENCOUNTER — Ambulatory Visit: Payer: Medicare HMO | Admitting: Neurology

## 2023-09-25 ENCOUNTER — Ambulatory Visit: Admitting: Internal Medicine

## 2023-09-30 ENCOUNTER — Other Ambulatory Visit: Payer: Self-pay | Admitting: Neurology

## 2023-09-30 DIAGNOSIS — M545 Low back pain, unspecified: Secondary | ICD-10-CM

## 2023-09-30 MED ORDER — OXYCODONE-ACETAMINOPHEN 10-325 MG PO TABS: 1.0000 | ORAL_TABLET | Freq: Four times a day (QID) | ORAL | 0 refills | Status: DC | PRN

## 2023-09-30 NOTE — Telephone Encounter (Signed)
 Last seen on 07/02/23 Follow up scheduled on 02/05/24   Dispensed Days Supply Quantity Provider Pharmacy  OXYCOD/ACETAM 10-325MG  TAB 09/02/2023 30 120 each Sater, Sherida Dimmer, MD [facility] [phone] ...     Rx pending to be signed

## 2023-09-30 NOTE — Telephone Encounter (Signed)
 Pt is needing a refill on her oxyCODONE -acetaminophen  (PERCOCET) 10-325 MG tablet and is needing it sent to the [facility].

## 2023-10-07 ENCOUNTER — Ambulatory Visit (INDEPENDENT_AMBULATORY_CARE_PROVIDER_SITE_OTHER): Admitting: Sports Medicine

## 2023-10-07 ENCOUNTER — Ambulatory Visit (HOSPITAL_BASED_OUTPATIENT_CLINIC_OR_DEPARTMENT_OTHER)
Admission: RE | Admit: 2023-10-07 | Discharge: 2023-10-07 | Disposition: A | Discharge status: Home / Self Care | Source: Ambulatory Visit | Attending: Sports Medicine | Admitting: Sports Medicine

## 2023-10-07 ENCOUNTER — Other Ambulatory Visit (INDEPENDENT_AMBULATORY_CARE_PROVIDER_SITE_OTHER)

## 2023-10-07 VITALS — BP 141/84 | Ht 65.0 in | Wt 224.0 lb

## 2023-10-07 DIAGNOSIS — M25561 Pain in right knee: Secondary | ICD-10-CM | POA: Insufficient documentation

## 2023-10-07 DIAGNOSIS — M1711 Unilateral primary osteoarthritis, right knee: Secondary | ICD-10-CM | POA: Diagnosis not present

## 2023-10-07 DIAGNOSIS — E782 Mixed hyperlipidemia: Secondary | ICD-10-CM | POA: Diagnosis not present

## 2023-10-07 DIAGNOSIS — M25461 Effusion, right knee: Secondary | ICD-10-CM | POA: Diagnosis not present
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
## 2023-10-08 ENCOUNTER — Other Ambulatory Visit (HOSPITAL_COMMUNITY): Payer: Self-pay

## 2023-10-08 ENCOUNTER — Telehealth: Payer: Self-pay

## 2023-10-08 ENCOUNTER — Encounter: Payer: Self-pay | Admitting: Sports Medicine

## 2023-10-08 ENCOUNTER — Other Ambulatory Visit: Payer: Self-pay | Admitting: Family Medicine

## 2023-10-08 ENCOUNTER — Ambulatory Visit: Payer: Self-pay | Admitting: Family Medicine

## 2023-10-08 ENCOUNTER — Ambulatory Visit: Admitting: Sports Medicine

## 2023-10-08 LAB — HEPATIC FUNCTION PANEL
ALT: 34 U/L (ref 0–35)
AST: 27 U/L (ref 0–37)
Albumin: 4.4 g/dL (ref 3.5–5.2)
Alkaline Phosphatase: 60 U/L (ref 39–117)
Bilirubin, Direct: 0.1 mg/dL (ref 0.0–0.3)
Total Bilirubin: 0.5 mg/dL (ref 0.2–1.2)
Total Protein: 7.4 g/dL (ref 6.0–8.3)

## 2023-10-08 LAB — LIPID PANEL
Cholesterol: 257 mg/dL — ABNORMAL HIGH (ref 0–200)
HDL: 63.6 mg/dL (ref >39.00)
LDL Cholesterol: 176 mg/dL — ABNORMAL HIGH (ref 0–99)
NonHDL: 193.73
Total CHOL/HDL Ratio: 4
Triglycerides: 90 mg/dL (ref 0.0–149.0)
VLDL: 18 mg/dL (ref 0.0–40.0)

## 2023-10-08 MED ORDER — REPATHA SURECLICK 140 MG/ML ~~LOC~~ SOAJ: 140.0000 mg | SUBCUTANEOUS | 2 refills | Status: DC

## 2023-10-08 NOTE — Telephone Encounter (Signed)
 Pharmacy Patient Advocate Encounter   Received notification from Patient Pharmacy that prior authorization for Repatha Sure[employer] is required/requested.   Insurance verification completed.   The patient is insured through [place] .   Per test claim: PA required; PA submitted to above mentioned insurance via CoverMyMeds Key/confirmation #/EOC [id number] Status is pending

## 2023-10-08 NOTE — Telephone Encounter (Signed)
 Sent pt mychart message let her know.

## 2023-10-08 NOTE — Telephone Encounter (Signed)
 Pharmacy Patient Advocate Encounter  Received notification from [employer] that Prior Authorization for Repatha Sur[employer] has been APPROVED from 04/30/23 to 04/28/24. Ran test claim, Copay is $0.00. This test claim was processed through [facility]- copay amounts may vary at other pharmacies due to pharmacy/plan contracts, or as the patient moves through the different stages of their insurance plan.   PA #/Case ID/Reference #: [id number]

## 2023-10-10 ENCOUNTER — Other Ambulatory Visit

## 2023-10-14 ENCOUNTER — Ambulatory Visit (INDEPENDENT_AMBULATORY_CARE_PROVIDER_SITE_OTHER)

## 2023-10-14 VITALS — Ht 65.0 in | Wt 224.0 lb

## 2023-10-14 DIAGNOSIS — Z0000 Encounter for general adult medical examination without abnormal findings: Secondary | ICD-10-CM | POA: Diagnosis not present

## 2023-10-14 NOTE — Progress Notes (Signed)
 Subjective:   Kimberly Mann is a 68 y.o. who presents for a Medicare Wellness preventive visit.  As a reminder, Annual Wellness Visits don't include a physical exam, and some assessments may be limited, especially if this visit is performed virtually. We may recommend an in-person follow-up visit with your provider if needed.  Visit Complete: Virtual I connected with  Kimberly Mann on 10/14/23 by a audio enabled telemedicine application and verified that I am speaking with the correct person using two identifiers.  Patient Location: Home  Provider Location: Home Office  I discussed the limitations of evaluation and management by telemedicine. The patient expressed understanding and agreed to proceed.  Vital Signs: Because this visit was a virtual/telehealth visit, some criteria may be missing or patient reported. Any vitals not documented were not able to be obtained and vitals that have been documented are patient reported.  VideoDeclined- This patient declined Librarian, academic. Therefore the visit was completed with audio only.  Persons Participating in Visit: Patient.  AWV Questionnaire: No: Patient Medicare AWV questionnaire was not completed prior to this visit.  Cardiac Risk Factors include: advanced age (>22men, >5 women);hypertension;dyslipidemia;sedentary lifestyle     Objective:    Today's Vitals   10/14/23 1548  Weight: 224 lb (101.6 kg)  Height: 5' 5 (1.651 m)   Body mass index is 37.28 kg/m.     10/14/2023    4:03 PM 04/03/2023    6:35 PM 03/24/2023    3:42 PM 01/05/2023    1:59 PM 10/13/2022    3:03 PM 07/23/2022    2:41 PM 04/08/2022    2:07 PM  Advanced Directives  Does Patient Have a Medical Advance Directive? No No No No No No No  Would patient like information on creating a medical advance directive? Yes (MAU/Ambulatory/Procedural Areas - Information given)  No - Patient declined   No - Patient declined No - Patient  declined    Current Medications (verified) Outpatient Encounter Medications as of 10/14/2023  Medication Sig   benzonatate  (TESSALON ) 200 MG capsule Take 1 capsule (200 mg total) by mouth 2 (two) times daily as needed for cough.   Evolocumab  (REPATHA  SURECLICK) 140 MG/ML SOAJ Inject 140 mg into the skin every 14 (fourteen) days.   ezetimibe  (ZETIA ) 10 MG tablet Take 1 tablet by mouth once daily   fenofibrate  (TRICOR ) 48 MG tablet Take 1 tablet (48 mg total) by mouth daily.   fluconazole  (DIFLUCAN ) 150 MG tablet Take 1 tablet by mouth once daily   fluticasone  (FLONASE ) 50 MCG/ACT nasal spray Place 2 sprays into both nostrils daily.   hydrOXYzine  (VISTARIL ) 25 MG capsule Take 1 capsule (25 mg total) by mouth 3 (three) times daily as needed.   levocetirizine (XYZAL ) 5 MG tablet TAKE 1 TABLET BY MOUTH ONCE DAILY IN THE EVENING   lidocaine  (LIDODERM ) 5 % Place 1 patch onto the skin daily. Remove & Discard patch within 12 hours or as directed by MD   meloxicam  (MOBIC ) 15 MG tablet Take 15 mg by mouth daily.   methocarbamol  (ROBAXIN ) 500 MG tablet Take 1 tablet (500 mg total) by mouth 2 (two) times daily.   Multiple Vitamins-Minerals (MULTIVITAMIN ADULT PO)    oxyCODONE -acetaminophen  (PERCOCET) 10-325 MG tablet Take 1 tablet by mouth every 6 (six) hours as needed for pain.   pantoprazole  (PROTONIX ) 40 MG tablet Take 1 tablet (40 mg total) by mouth daily.   potassium chloride  (KLOR-CON  M) 10 MEQ tablet Take 1 tablet (10 mEq  total) by mouth 2 (two) times daily for 7 days.   promethazine  (PHENERGAN ) 12.5 MG tablet Take 1 tablet (12.5 mg total) by mouth every 6 (six) hours as needed for nausea or vomiting.   triamterene -hydrochlorothiazide (MAXZIDE-25) 37.5-25 MG tablet Take 1 tablet by mouth daily.   Vitamin D , Ergocalciferol , (DRISDOL ) 1.25 MG (50000 UNIT) CAPS capsule Take 1 capsule by mouth once a week   naproxen  (NAPROSYN ) 500 MG tablet Take 1 tablet (500 mg total) by mouth 2 (two) times daily.    No facility-administered encounter medications on file as of 10/14/2023.    Allergies (verified) Morphine and codeine, Other, Sulfacetamide sodium, Penicillins, Statins, Sulfa antibiotics, and Morphine   History: Past Medical History:  Diagnosis Date   Anxiety    Cancer (HCC)    breast   Chronic back pain    takes oxycodone    Complication of anesthesia    Eczema    Environmental allergies    Headache    Hypertension    PONV (postoperative nausea and vomiting)    Vertigo    Past Surgical History:  Procedure Laterality Date   BREAST REDUCTION SURGERY Left 11/20/2017   Procedure: left breast reduction for symmetry and liposuction bilateral lateral breasts;  Surgeon: Thornell Flirt, DO;  Location: [facility];  Service: Plastics;  Laterality: Left;   BREAST SURGERY     rt breast lumpectomy   HYSTEROSCOPY WITH D & C N/A 06/05/2021   Procedure: DILATATION AND CURETTAGE /HYSTEROSCOPY/MYOSURE RESECTION OF UTERINE FIBROIDS;  Surgeon: Lenord Radon, MD;  Location: [facility];  Service: Gynecology;  Laterality: N/A;   LIPOSUCTION     TUBAL LIGATION     Family History  Problem Relation Age of Onset   Hypertension Mother    Social History   Socioeconomic History   Marital status: Legally Separated    Spouse name: Not on file   Number of children: Not on file   Years of education: Not on file   Highest education level: Not on file  Occupational History   Not on file  Tobacco Use   Smoking status: Former    Current packs/day: 0.00    Types: Cigarettes    Quit date: 05/17/1974    Years since quitting: 49.4   Smokeless tobacco: Never  Vaping Use   Vaping status: Never Used  Substance and Sexual Activity   Alcohol use: Not Currently    Alcohol/week: 7.0 standard drinks of alcohol    Types: 7 Standard drinks or equivalent per week   Drug use: Not Currently    Types: Marijuana    Comment: Last use 06/04/21   Sexual activity: Not on  file  Other Topics Concern   Not on file  Social History Narrative   Not on file   Social Drivers of Health   Financial Resource Strain: Low Risk  (10/14/2023)   Overall Financial Resource Strain (CARDIA)    Difficulty of Paying Living Expenses: Not hard at all  Food Insecurity: No Food Insecurity (10/14/2023)   Hunger Vital Sign    Worried About Running Out of Food in the Last Year: Never true    Ran Out of Food in the Last Year: Never true  Transportation Needs: No Transportation Needs (10/14/2023)   PRAPARE - Administrator, Civil Service (Medical): No    Lack of Transportation (Non-Medical): No  Physical Activity: Inactive (10/14/2023)   Exercise Vital Sign    Days of Exercise per Week: 0 days  Minutes of Exercise per Session: 0 min  Stress: No Stress Concern Present (10/14/2023)   [employer] of Occupational Health - Occupational Stress Questionnaire    Feeling of Stress: Not at all  Social Connections: Moderately Isolated (10/14/2023)   Social Connection and Isolation Panel    Frequency of Communication with Friends and Family: More than three times a week    Frequency of Social Gatherings with Friends and Family: Three times a week    Attends Religious Services: More than 4 times per year    Active Member of Clubs or Organizations: No    Attends Banker Meetings: Never    Marital Status: Separated    Tobacco Counseling Counseling given: Not Answered    Clinical Intake:  Pre-visit preparation completed: Yes  Pain : No/denies pain     Diabetes: No  Lab Results  Component Value Date   HGBA1C 6.0 08/25/2023   HGBA1C 5.8 02/12/2023   HGBA1C 5.7 06/18/2022     How often do you need to have someone help you when you read instructions, pamphlets, or other written materials from your doctor or pharmacy?: 1 - Never  Interpreter Needed?: No  Information entered by :: Seabron Cypress LPN   Activities of Daily Living     10/14/2023     4:03 PM  In your present state of health, do you have any difficulty performing the following activities:  Hearing? 0  Vision? 0  Difficulty concentrating or making decisions? 0  Walking or climbing stairs? 1  Dressing or bathing? 0  Doing errands, shopping? 0  Preparing Food and eating ? N  Using the Toilet? N  In the past six months, have you accidently leaked urine? N  Do you have problems with loss of bowel control? N  Managing your Medications? N  Managing your Finances? N  Housekeeping or managing your Housekeeping? N    Patient Care Team: Jobe Mulder, DO as PCP - General (Family Medicine) Sater, Sherida Dimmer, MD (Neurology) Frazier Jacob, DO as Consulting Physician (Sports Medicine) Annetta Killian, MD as Referring Physician (Surgical Oncology) Tsamis, Ollie Bhat, MD as Referring Physician (Ophthalmology)  I have updated your Care Teams any recent Medical Services you may have received from other providers in the past year.     Assessment:   This is a routine wellness examination for Kimberly Mann.  Hearing/Vision screen Hearing Screening - Comments:: Denies hearing difficulties   Vision Screening - Comments::  up to date with routine eye exams with Dr. Glenwood Lapidus    Goals Addressed             This Visit's Progress    Patient Stated   On track    Just focusing on getting well       Depression Screen     10/14/2023    4:01 PM 08/25/2023    9:55 AM 02/12/2023   12:51 PM 07/23/2022    2:28 PM 06/18/2022   11:13 AM 02/01/2021   11:52 AM 12/01/2020    9:10 AM  PHQ 2/9 Scores  PHQ - 2 Score 0 0 0 1 2 0 0  PHQ- 9 Score 0 0 2  6      Fall Risk     10/14/2023    4:03 PM 08/25/2023    9:55 AM 07/23/2022    2:25 PM 06/18/2022   11:13 AM 02/01/2021   11:50 AM  Fall Risk   Falls in the past year? 0 0 1 0  1  Comment   from vertigo    Number falls in past yr: 0 0 0 0 1  Injury with Fall? 0 0 0 0 0  Risk for fall due to : No Fall Risks;Impaired  balance/gait;Impaired mobility  History of fall(s) No Fall Risks History of fall(s)  Follow up Falls prevention discussed;Education provided;Falls evaluation completed Follow up appointment Falls evaluation completed Falls evaluation completed Falls prevention discussed      Data saved with a previous flowsheet row definition    MEDICARE RISK AT HOME:  Medicare Risk at Home Any stairs in or around the home?: No If so, are there any without handrails?: No Home free of loose throw rugs in walkways, pet beds, electrical cords, etc?: Yes Adequate lighting in your home to reduce risk of falls?: Yes Life alert?: No Use of a cane, walker or w/c?: Yes Grab bars in the bathroom?: Yes Shower chair or bench in shower?: No Elevated toilet seat or a handicapped toilet?: Yes  TIMED UP AND GO:  Was the test performed?  No  Cognitive Function: 6CIT completed        10/14/2023    4:03 PM 07/23/2022    2:38 PM  6CIT Screen  What Year? 0 points 0 points  What month? 0 points 0 points  What time? 0 points 0 points  Count back from 20 0 points 0 points  Months in reverse 2 points 4 points  Repeat phrase 0 points 2 points  Total Score 2 points 6 points    Immunizations Immunization History  Administered Date(s) Administered   Moderna Sars-Covid-2 Vaccination 08/31/2019, 09/28/2019   Pneumococcal Polysaccharide-23 11/02/2019   Tdap 07/23/2017    Screening Tests Health Maintenance  Topic Date Due   INFLUENZA VACCINE  11/28/2023   Medicare Annual Wellness (AWV)  10/13/2024   DTaP/Tdap/Td (2 - Td or Tdap) 07/24/2027   Colonoscopy  03/01/2029   DEXA SCAN  Completed   Hepatitis C Screening  Completed   HPV VACCINES  Aged Out   Meningococcal B Vaccine  Aged Out   Pneumococcal Vaccine: 50+ Years  Discontinued   MAMMOGRAM  Discontinued   COVID-19 Vaccine  Discontinued   Zoster Vaccines- Shingrix  Discontinued    Health Maintenance  There are no preventive care reminders to display  for this patient.   Additional Screening:  Vision Screening: Recommended annual ophthalmology exams for early detection of glaucoma and other disorders of the eye. Would you like a referral to an eye doctor? No    Dental Screening: Recommended annual dental exams for proper oral hygiene  Community Resource Referral / Chronic Care Management: CRR required this visit?  No   CCM required this visit?  No   Plan:    I have personally reviewed and noted the following in the patient's chart:   Medical and social history Use of alcohol, tobacco or illicit drugs  Current medications and supplements including opioid prescriptions. Patient is currently taking opioid prescriptions. Information provided to patient regarding non-opioid alternatives. Patient advised to discuss non-opioid treatment plan with their provider. Functional ability and status Nutritional status Physical activity Advanced directives List of other physicians Hospitalizations, surgeries, and ER visits in previous 12 months Vitals Screenings to include cognitive, depression, and falls Referrals and appointments  In addition, I have reviewed and discussed with patient certain preventive protocols, quality metrics, and best practice recommendations. A written personalized care plan for preventive services as well as general preventive health recommendations were provided to patient.  Seabron Cypress [place], [place]   1/61/0960   After Visit Summary: (MyChart) Due to this being a telephonic visit, the after visit summary with patients personalized plan was offered to patient via MyChart   Notes: Nothing significant to report at this time.

## 2023-10-14 NOTE — Patient Instructions (Signed)
 [name] , Thank you for taking time out of your busy schedule to complete your Annual Wellness Visit with me. I enjoyed our conversation and look forward to speaking with you again next year. I, as well as your care team,  appreciate your ongoing commitment to your health goals. Please review the following plan we discussed and let me know if I can assist you in the future. Your Game plan/ To Do List     Follow up Visits: Next Medicare AWV with our clinical staff: In 1 year   Have you seen your provider in the last 6 months (3 months if uncontrolled diabetes)? Yes Next Office Visit with your provider: 02/25/24 @ 10:30  Clinician Recommendations:  Aim for 30 minutes of exercise or brisk walking, 6-8 glasses of water, and 5 servings of fruits and vegetables each day.       This is a list of the screening recommended for you and due dates:  Health Maintenance  Topic Date Due   Flu Shot  11/28/2023   Medicare Annual Wellness Visit  10/13/2024   DTaP/Tdap/Td vaccine (2 - Td or Tdap) 07/24/2027   Colon Cancer Screening  03/01/2029   DEXA scan (bone density measurement)  Completed   Hepatitis C Screening  Completed   HPV Vaccine  Aged Out   Meningitis B Vaccine  Aged Out   Pneumococcal Vaccine for age over 27  Discontinued   Mammogram  Discontinued   COVID-19 Vaccine  Discontinued   Zoster (Shingles) Vaccine  Discontinued    Advanced directives: (ACP Link)Information on Advanced Care Planning can be found at [place]  Secretary of [facility] Advance Health Care Directives Advance Health Care Directives. [email or web address]   Advance Care Planning is important because it:  [x]  Makes sure you receive the medical care that is consistent with your values, goals, and preferences  [x]  It provides guidance to your family and loved ones and reduces their decisional burden about whether or not they are making the right decisions based on your wishes.  Follow the link provided in your after visit  summary or read over the paperwork we have mailed to you to help you started getting your Advance Directives in place. If you need assistance in completing these, please reach out to us  so that we can help you!  See attachments for Preventive Care and Fall Prevention Tips.

## 2023-10-16 ENCOUNTER — Other Ambulatory Visit: Payer: Self-pay | Admitting: Family Medicine

## 2023-10-23 ENCOUNTER — Telehealth: Payer: Self-pay | Admitting: Neurology

## 2023-10-23 ENCOUNTER — Other Ambulatory Visit: Payer: Self-pay

## 2023-10-23 NOTE — Telephone Encounter (Signed)
 Last refilled 10/03/23. Too soon to send in refill. Called son (on [place]) at [phone]. He will call back next week to request refill.

## 2023-10-23 NOTE — Telephone Encounter (Signed)
Pt is requesting a refill for oxyCODONE-acetaminophen (PERCOCET) 10-325 MG tablet .  Pharmacy: [id number]   

## 2023-10-27 DIAGNOSIS — E7800 Pure hypercholesterolemia, unspecified: Secondary | ICD-10-CM | POA: Diagnosis not present

## 2023-10-27 DIAGNOSIS — I10 Essential (primary) hypertension: Secondary | ICD-10-CM | POA: Diagnosis not present

## 2023-10-27 DIAGNOSIS — N390 Urinary tract infection, site not specified: Secondary | ICD-10-CM | POA: Diagnosis not present

## 2023-10-27 DIAGNOSIS — R300 Dysuria: Secondary | ICD-10-CM | POA: Diagnosis not present

## 2023-10-27 DIAGNOSIS — Z6837 Body mass index (BMI) 37.0-37.9, adult: Secondary | ICD-10-CM | POA: Diagnosis not present

## 2023-10-28 DIAGNOSIS — Z1231 Encounter for screening mammogram for malignant neoplasm of breast: Secondary | ICD-10-CM | POA: Diagnosis not present

## 2023-10-29 ENCOUNTER — Other Ambulatory Visit: Payer: Self-pay

## 2023-10-29 DIAGNOSIS — M545 Low back pain, unspecified: Secondary | ICD-10-CM

## 2023-10-29 MED ORDER — OXYCODONE-ACETAMINOPHEN 10-325 MG PO TABS: 1.0000 | ORAL_TABLET | Freq: Four times a day (QID) | ORAL | 0 refills | Status: DC | PRN

## 2023-10-29 NOTE — Telephone Encounter (Signed)
 Pt Last Seen 07/02/2023 Upcoming Appointment 02/05/2024  OxyCodone  10-325mg  Last Filled 10/03/2023 Escript 10/29/2023

## 2023-11-05 ENCOUNTER — Encounter: Payer: Self-pay | Admitting: Sports Medicine

## 2023-11-05 ENCOUNTER — Ambulatory Visit (INDEPENDENT_AMBULATORY_CARE_PROVIDER_SITE_OTHER): Admitting: Sports Medicine

## 2023-11-05 ENCOUNTER — Ambulatory Visit (HOSPITAL_BASED_OUTPATIENT_CLINIC_OR_DEPARTMENT_OTHER)
Admission: RE | Admit: 2023-11-05 | Discharge: 2023-11-05 | Disposition: A | Discharge status: Home / Self Care | Source: Ambulatory Visit | Attending: Sports Medicine | Admitting: Sports Medicine

## 2023-11-05 VITALS — BP 130/86 | Ht 65.0 in | Wt 224.0 lb

## 2023-11-05 DIAGNOSIS — M5136 Other intervertebral disc degeneration, lumbar region with discogenic back pain only: Secondary | ICD-10-CM | POA: Diagnosis not present

## 2023-11-05 DIAGNOSIS — M545 Low back pain, unspecified: Secondary | ICD-10-CM | POA: Diagnosis not present

## 2023-11-05 DIAGNOSIS — I700 Atherosclerosis of aorta: Secondary | ICD-10-CM | POA: Diagnosis not present

## 2023-11-05 DIAGNOSIS — M47816 Spondylosis without myelopathy or radiculopathy, lumbar region: Secondary | ICD-10-CM | POA: Diagnosis not present

## 2023-11-05 MED ORDER — MELOXICAM 15 MG PO TABS: ORAL_TABLET | ORAL | 0 refills | Status: DC

## 2023-11-05 NOTE — Progress Notes (Signed)
   Subjective:    Patient ID: Kimberly Mann, female    DOB: [birth year], 68 y.o.   MRN: [id number]  HPI chief complaint: Low back pain  Patient presents today with low back pain that began after she tripped over a rug at [employer] on June 27.  She had immediate low back pain.  She has been using a frozen water bottle with some improvement.  Taking over-the-counter NSAIDs which have not been very helpful.  It is diffuse across her low back.  Will radiate into her buttocks.  X-rays of her lumbar spine in 2024 showed multi level degenerative changes with disc space narrowing and facet changes. She has also had worsening right knee pain and swelling since the fall.  Previous x-rays showed osteoarthritis.  She has been using a compression wrap and using a topical spray for pain which has been helpful.    Review of Systems As above    Objective:   Physical Exam  Well-developed, well-nourished.  No acute distress.    Lumbar spine: Diffuse tenderness to palpation along the lower lumbar spine but no focal tenderness along the midline.  Limited range of motion with reproducible pain with extension.  No significant pain with flexion.  No gross neurological deficit of either lower extremity.  Right knee: Range of motion 0 to 100 degrees.  1+ effusion.  She is tender to palpation diffusely throughout the knee.  Knee is grossly stable ligamentous exam.  Neurovascularly intact distally.      Assessment & Plan:   Low back pain likely secondary to facet arthropathy Right knee pain and swelling secondary to DJD  Will get an updated x-ray of her lumbar spine and I will MyChart message her with those results early next week.  We will trial Mobic  15 mg daily with food for the next 5 days and then as needed.  When I follow-up with her via MyChart next week with her x-ray results, we will see how she is feeling.  We discussed an appointment at our [facility] office to have the right knee aspirated  and injected under ultrasound if her symptoms are not improving.  We also discussed getting an MRI of her lumbar spine in anticipation of facet injections if her low back pain is not improving.  This note was dictated using Dragon naturally speaking software and may contain errors in syntax, spelling, or content which have not been identified prior to signing this note.

## 2023-11-10 ENCOUNTER — Encounter: Payer: Self-pay | Admitting: Sports Medicine

## 2023-11-10 DIAGNOSIS — C50511 Malignant neoplasm of lower-outer quadrant of right female breast: Secondary | ICD-10-CM | POA: Diagnosis not present

## 2023-11-10 DIAGNOSIS — Z853 Personal history of malignant neoplasm of breast: Secondary | ICD-10-CM | POA: Diagnosis not present

## 2023-11-10 DIAGNOSIS — Z170 Estrogen receptor positive status [ER+]: Secondary | ICD-10-CM | POA: Diagnosis not present

## 2023-11-10 DIAGNOSIS — Z08 Encounter for follow-up examination after completed treatment for malignant neoplasm: Secondary | ICD-10-CM | POA: Diagnosis not present

## 2023-11-16 DIAGNOSIS — E6609 Other obesity due to excess calories: Secondary | ICD-10-CM | POA: Diagnosis not present

## 2023-11-16 DIAGNOSIS — D259 Leiomyoma of uterus, unspecified: Secondary | ICD-10-CM | POA: Diagnosis not present

## 2023-11-16 DIAGNOSIS — N390 Urinary tract infection, site not specified: Secondary | ICD-10-CM | POA: Diagnosis not present

## 2023-11-16 DIAGNOSIS — Z6837 Body mass index (BMI) 37.0-37.9, adult: Secondary | ICD-10-CM | POA: Diagnosis not present

## 2023-11-16 DIAGNOSIS — N3090 Cystitis, unspecified without hematuria: Secondary | ICD-10-CM | POA: Diagnosis not present

## 2023-11-16 DIAGNOSIS — I10 Essential (primary) hypertension: Secondary | ICD-10-CM | POA: Diagnosis not present

## 2023-11-16 DIAGNOSIS — R030 Elevated blood-pressure reading, without diagnosis of hypertension: Secondary | ICD-10-CM | POA: Diagnosis not present

## 2023-11-24 DIAGNOSIS — Z13820 Encounter for screening for osteoporosis: Secondary | ICD-10-CM | POA: Diagnosis not present

## 2023-11-24 DIAGNOSIS — Z01411 Encounter for gynecological examination (general) (routine) with abnormal findings: Secondary | ICD-10-CM | POA: Diagnosis not present

## 2023-11-24 DIAGNOSIS — R102 Pelvic and perineal pain: Secondary | ICD-10-CM | POA: Diagnosis not present

## 2023-11-26 ENCOUNTER — Other Ambulatory Visit: Payer: Self-pay | Admitting: Family Medicine

## 2023-11-27 ENCOUNTER — Other Ambulatory Visit: Payer: Self-pay

## 2023-11-27 ENCOUNTER — Encounter (HOSPITAL_BASED_OUTPATIENT_CLINIC_OR_DEPARTMENT_OTHER): Payer: Self-pay | Admitting: Emergency Medicine

## 2023-11-27 ENCOUNTER — Emergency Department (HOSPITAL_BASED_OUTPATIENT_CLINIC_OR_DEPARTMENT_OTHER)
Admission: EM | Admit: 2023-11-27 | Discharge: 2023-11-27 | Disposition: A | Discharge status: Home / Self Care | Attending: Emergency Medicine | Admitting: Emergency Medicine

## 2023-11-27 ENCOUNTER — Emergency Department (HOSPITAL_BASED_OUTPATIENT_CLINIC_OR_DEPARTMENT_OTHER)

## 2023-11-27 DIAGNOSIS — R0789 Other chest pain: Secondary | ICD-10-CM | POA: Diagnosis not present

## 2023-11-27 DIAGNOSIS — R002 Palpitations: Secondary | ICD-10-CM | POA: Diagnosis not present

## 2023-11-27 DIAGNOSIS — R079 Chest pain, unspecified: Secondary | ICD-10-CM | POA: Diagnosis not present

## 2023-11-27 DIAGNOSIS — Z853 Personal history of malignant neoplasm of breast: Secondary | ICD-10-CM | POA: Diagnosis not present

## 2023-11-27 DIAGNOSIS — I10 Essential (primary) hypertension: Secondary | ICD-10-CM | POA: Diagnosis not present

## 2023-11-27 DIAGNOSIS — Z79899 Other long term (current) drug therapy: Secondary | ICD-10-CM | POA: Diagnosis not present

## 2023-11-27 DIAGNOSIS — R630 Anorexia: Secondary | ICD-10-CM | POA: Insufficient documentation

## 2023-11-27 LAB — CBC
HCT: 40.9 % (ref 36.0–46.0)
Hemoglobin: 13.5 g/dL (ref 12.0–15.0)
MCH: 28.8 pg (ref 26.0–34.0)
MCHC: 33 g/dL (ref 30.0–36.0)
MCV: 87.2 fL (ref 80.0–100.0)
Platelets: 285 K/uL (ref 150–400)
RBC: 4.69 MIL/uL (ref 3.87–5.11)
RDW: 13.9 % (ref 11.5–15.5)
WBC: 6.7 K/uL (ref 4.0–10.5)
nRBC: 0 % (ref 0.0–0.2)

## 2023-11-27 LAB — TROPONIN T, HIGH SENSITIVITY
Troponin T High Sensitivity: <15 ng/L (ref <19)
Troponin T High Sensitivity: <15 ng/L (ref <19)

## 2023-11-27 LAB — BASIC METABOLIC PANEL WITH GFR
Anion gap: 13 (ref 5–15)
BUN: 16 mg/dL (ref 8–23)
CO2: 24 mmol/L (ref 22–32)
Calcium: 9.9 mg/dL (ref 8.9–10.3)
Chloride: 103 mmol/L (ref 98–111)
Creatinine, Ser: 0.97 mg/dL (ref 0.44–1.00)
GFR, Estimated: >60 mL/min (ref >60)
Glucose, Bld: 94 mg/dL (ref 70–99)
Potassium: 3.5 mmol/L (ref 3.5–5.1)
Sodium: 141 mmol/L (ref 135–145)

## 2023-11-27 LAB — D-DIMER, QUANTITATIVE: D-Dimer, Quant: 0.47 ug{FEU}/mL (ref 0.00–0.50)

## 2023-11-27 MED ORDER — ALUM & MAG HYDROXIDE-SIMETH 200-200-20 MG/5ML PO SUSP
30.0000 mL | Freq: Once | ORAL | Status: AC
  Administered 2023-11-27: 30 mL via ORAL
  Filled 2023-11-27: qty 30

## 2023-11-27 MED ORDER — LIDOCAINE 5 % EX PTCH: 1.0000 | MEDICATED_PATCH | CUTANEOUS | 0 refills | Status: AC

## 2023-11-27 MED ORDER — MELOXICAM 15 MG PO TABS
15.0000 mg | ORAL_TABLET | Freq: Every day | ORAL | 0 refills | Status: DC
Start: 2023-11-27 — End: 2024-02-05

## 2023-11-27 NOTE — Discharge Instructions (Signed)
 Please read and follow all provided instructions.  Your diagnoses today include:  1. Chest pain, unspecified type   2. Chest wall tenderness     Tests performed today include: An EKG of your heart A chest x-ray Cardiac enzymes - a blood test for heart muscle damage, were normal Blood counts and electrolytes Screening test for blood clot: Was negative Vital signs. See below for your results today.   Medications prescribed:  Meloxicam  - anti-inflammatory pain medication  You have been prescribed an anti-inflammatory medication or NSAID. Take with food. Do not take aspirin, ibuprofen , or naproxen  if taking this medication. Take smallest effective dose for the shortest duration needed for your pain. Stop taking if you experience stomach pain or vomiting.   Lidocaine  patches  Take any prescribed medications only as directed.  Follow-up instructions: Please follow-up with your primary care provider in 1 week for further evaluation of your symptoms if not improved.   Return instructions:  SEEK IMMEDIATE MEDICAL ATTENTION IF: You have severe chest pain, especially if the pain is crushing or pressure-like and spreads to the arms, back, neck, or jaw, or if you have sweating, nausea or vomiting, or trouble with breathing. THIS IS AN EMERGENCY. Do not wait to see if the pain will go away. Get medical help at once. Call 911. DO NOT drive yourself to the hospital.  Your chest pain gets worse and does not go away after a few minutes of rest.  You have an attack of chest pain lasting longer than what you usually experience.  You have significant dizziness, if you pass out, or have trouble walking.  You have chest pain not typical of your usual pain for which you originally saw your caregiver.  You have any other emergent concerns regarding your health.  Additional Information: Chest pain comes from many different causes. Your caregiver has diagnosed you as having chest pain that is not specific  for one problem, but does not require admission.  You are at low risk for an acute heart condition or other serious illness.   Your vital signs today were: BP (!) 147/80   Pulse 72   Temp 98.4 F (36.9 C)   Resp 18   Ht 5' 5 (1.651 m)   Wt 101.6 kg   SpO2 100%   BMI 37.28 kg/m  If your blood pressure (BP) was elevated above 135/85 this visit, please have this repeated by your doctor within one month. --------------

## 2023-11-27 NOTE — ED Provider Notes (Signed)
 [facility] Provider Note   CSN: [id number] Arrival date & time: 11/27/23  1134     Patient presents with: Palpitations   Kimberly Mann is a 68 y.o. female.   Patient with history of migraine, hyperlipidemia, hypertension, breast cancer-- presents to the emergency department for evaluation of palpitations which she describes as chest pain.  She denies sensation of heart racing, skipping beats.  Patient reports having a sharp pain, described as a heaviness, in the mid chest since yesterday morning.  Symptoms started after she took blood pressure medication.  She denies exertional symptoms.  She reported shortness of breath to triage.  No diaphoresis or vomiting.  She had decreased appetite.  She reports recent abdominal symptoms, being evaluated for fibroids.  Denies history of MI.  No lower extremity swelling.  She took Gas-X last night which seemed to help temporarily, but symptoms returned.       Prior to Admission medications   Medication Sig Start Date End Date Taking? Authorizing Provider  benzonatate  (TESSALON ) 200 MG capsule Take 1 capsule (200 mg total) by mouth 2 (two) times daily as needed for cough. 08/25/23   Frann Mabel Mt, DO  Evolocumab  (REPATHA  SURECLICK) 140 MG/ML SOAJ Inject 140 mg into the skin every 14 (fourteen) days. 10/08/23   Frann Mabel Mt, DO  ezetimibe  (ZETIA ) 10 MG tablet Take 1 tablet by mouth once daily 11/26/23   Frann Mabel Mt, DO  fenofibrate  (TRICOR ) 48 MG tablet Take 1 tablet (48 mg total) by mouth daily. 08/25/23   Frann Mabel Mt, DO  fluconazole  (DIFLUCAN ) 150 MG tablet Take 1 tablet by mouth once daily 07/14/23   Sater, Charlie LABOR, MD  fluticasone  (FLONASE ) 50 MCG/ACT nasal spray Place 2 sprays into both nostrils daily. 08/27/23   Frann Mabel Mt, DO  hydrOXYzine  (VISTARIL ) 25 MG capsule Take 1 capsule (25 mg total) by mouth 3 (three) times daily as needed. 07/02/23    Sater, Charlie LABOR, MD  levocetirizine (XYZAL ) 5 MG tablet TAKE 1 TABLET BY MOUTH ONCE DAILY IN THE EVENING 09/02/22   Wendling, Mabel Mt, DO  lidocaine  (LIDODERM ) 5 % Place 1 patch onto the skin daily. Remove & Discard patch within 12 hours or as directed by MD 01/31/23   Henderly, Britni A, PA-C  meloxicam  (MOBIC ) 15 MG tablet Take 1 by mouth with food once daily for 5 days; then take as needed. 11/05/23   Arvell Evalene SAUNDERS, DO  methocarbamol  (ROBAXIN ) 500 MG tablet Take 1 tablet (500 mg total) by mouth 2 (two) times daily. 01/31/23   Henderly, Britni A, PA-C  Multiple Vitamins-Minerals (MULTIVITAMIN ADULT PO)  03/29/15   [provider]  naproxen  (NAPROSYN ) 500 MG tablet Take 1 tablet (500 mg total) by mouth 2 (two) times daily. 05/12/23   Ajewole, Christana, MD  oxyCODONE -acetaminophen  (PERCOCET) 10-325 MG tablet Take 1 tablet by mouth every 6 (six) hours as needed for pain. 10/29/23   Sater, Charlie LABOR, MD  pantoprazole  (PROTONIX ) 40 MG tablet Take 1 tablet (40 mg total) by mouth daily. 06/12/21   Frann Mabel Mt, DO  potassium chloride  (KLOR-CON  M) 10 MEQ tablet Take 1 tablet (10 mEq total) by mouth 2 (two) times daily for 7 days. 08/25/23 10/14/23  Frann Mabel Mt, DO  promethazine  (PHENERGAN ) 12.5 MG tablet Take 1 tablet (12.5 mg total) by mouth every 6 (six) hours as needed for nausea or vomiting. 01/31/23   Henderly, Britni A, PA-C  triamterene -hydrochlorothiazide (MAXZIDE-25) 37.5-25 MG  tablet Take 1 tablet by mouth daily. 04/01/23   Frann Mabel Mt, DO  Vitamin D , Ergocalciferol , (DRISDOL ) 1.25 MG (50000 UNIT) CAPS capsule Take 1 capsule by mouth once a week 10/16/23   Frann Mabel Mt, DO    Allergies: Morphine and codeine, Other, Sulfacetamide sodium, Penicillins, Statins, Sulfa antibiotics, and Morphine    Review of Systems  Updated Vital Signs BP (!) 155/86 (BP Location: Left Arm)   Pulse 72   Temp 98.4 F (36.9 C) (Oral)   Resp 18   Ht 5' 5  (1.651 m)   Wt 101.6 kg   SpO2 100%   BMI 37.28 kg/m   Physical Exam Vitals and nursing note reviewed.  Constitutional:      Appearance: She is well-developed. She is not diaphoretic.  HENT:     Head: Normocephalic and atraumatic.     Nose: Nose normal.     Mouth/Throat:     Mouth: Mucous membranes are moist. Mucous membranes are not dry.  Eyes:     Conjunctiva/sclera: Conjunctivae normal.  Neck:     Vascular: Normal carotid pulses. No JVD.     Trachea: Trachea normal. No tracheal deviation.  Cardiovascular:     Rate and Rhythm: Normal rate and regular rhythm.     Pulses: No decreased pulses.          Radial pulses are 2+ on the right side and 2+ on the left side.     Heart sounds: Normal heart sounds, S1 normal and S2 normal. No murmur heard.    Comments: No murmur or irregular beats noted. Pulmonary:     Effort: Pulmonary effort is normal. No respiratory distress.     Breath sounds: No wheezing.     Comments: Lungs clear to auscultation bilaterally. Chest:     Chest wall: Tenderness present. No deformity or swelling.       Comments: Patient winces and is very tender to palpation over the midsternal area.  No signs of skin rash or shingles.  No bruising.  No deformities. Abdominal:     General: Bowel sounds are normal.     Palpations: Abdomen is soft.     Tenderness: There is no abdominal tenderness. There is no guarding or rebound.  Musculoskeletal:        General: Normal range of motion.     Cervical back: Normal range of motion and neck supple. No muscular tenderness.  Skin:    General: Skin is warm and dry.     Coloration: Skin is not pale.  Neurological:     Mental Status: She is alert.     (all labs ordered are listed, but only abnormal results are displayed) Labs Reviewed  BASIC METABOLIC PANEL WITH GFR  CBC  D-DIMER, QUANTITATIVE  TROPONIN T, HIGH SENSITIVITY  TROPONIN T, HIGH SENSITIVITY    ED ECG REPORT   Date: 11/27/2023  Rate: 69  Rhythm:  normal sinus rhythm  QRS Axis: left  Intervals: normal  ST/T Wave abnormalities: normal  Conduction Disutrbances:none  Narrative Interpretation:   Old EKG Reviewed: unchanged  I have personally reviewed the EKG tracing and agree with the computerized printout as noted.   Radiology: DG Chest 2 View Result Date: 11/27/2023 CLINICAL DATA:  Palpitation EXAM: CHEST - 2 VIEW COMPARISON:  Chest radiograph March 24, 2023 FINDINGS: The heart size and mediastinal contours are within normal limits. Both lungs are clear. The visualized skeletal structures are unremarkable. IMPRESSION: No active cardiopulmonary disease. Electronically Signed   By:  Megan  Zare M.D.   On: 11/27/2023 11:59     Procedures   Medications Ordered in the ED  alum & mag hydroxide-simeth (MAALOX/MYLANTA) 200-200-20 MG/5ML suspension 30 mL (has no administration in time range)   ED Course  Patient seen and examined. History obtained directly from patient.   Labs/EKG: Ordered CBC, BMP, troponin.  EKG personally reviewed and interpreted as above.  I have added D-dimer.  Imaging: Chest x-ray personally reviewed and interpreted, agree negative.  Medications/Fluids: None ordered  Most recent vital signs reviewed and are as follows: BP (!) 155/86 (BP Location: Left Arm)   Pulse 72   Temp 98.4 F (36.9 C) (Oral)   Resp 18   Ht 5' 5 (1.651 m)   Wt 101.6 kg   SpO2 100%   BMI 37.28 kg/m   Initial impression: Atypical chest pain, patient describes as palpitations, however this is what she interprets as chest pain.  Patient is very tender to palpation of her chest wall.  3:38 PM Reassessment performed. Patient appears stable and comfortable.  Labs personally reviewed and interpreted including: CBC unremarkable; BMP unremarkable; troponin negative x 2; D-dimer is normal.  Imaging personally visualized and interpreted including:   Reviewed pertinent lab work and imaging with patient at bedside. Questions answered.    Most current vital signs reviewed and are as follows: BP (!) 147/80   Pulse 72   Temp 98.4 F (36.9 C)   Resp 18   Ht 5' 5 (1.651 m)   Wt 101.6 kg   SpO2 100%   BMI 37.28 kg/m   Plan: Discharge to home.   Prescriptions written for: Patient has been taking meloxicam  recently, feels that she needs a refill, will provide, will also send in Lidoderm  patches.  Return and follow-up instructions: I encouraged patient to return to ED with severe chest pain, especially if the pain is crushing or pressure-like and spreads to the arms, back, neck, or jaw, or if they have associated sweating, vomiting, or shortness of breath with the pain, or significant pain with activity. We discussed that the evaluation here today indicates a low-risk of serious cause of chest pain, including heart trouble or a blood clot, but no evaluation is perfect and chest pain can evolve with time. The patient verbalized understanding and agreed.  I encouraged patient to follow-up with their provider in the next week for recheck if not improved.                                     Medical Decision Making Amount and/or Complexity of Data Reviewed Labs: ordered. Radiology: ordered.  Risk OTC drugs. Prescription drug management.   For this patient's complaint of chest pain, the following emergent conditions were considered on the differential diagnosis: acute coronary syndrome, pulmonary embolism, pneumothorax, myocarditis, pericardial tamponade, aortic dissection, thoracic aortic aneurysm complication, esophageal perforation.   Other causes were also considered including: gastroesophageal reflux disease, musculoskeletal pain including costochondritis, pneumonia/pleurisy, herpes zoster, pericarditis.  In regards to possibility of ACS, patient has atypical features of pain, non-ischemic and unchanged EKG and negative troponin(s). Heart score was calculated to be moderate.  In regards to possibility of PE, low risk  Wells, negative D-dimer.  Low concern for PE.  Normal vital signs and no hypoxia.  No DVT signs and symptoms.  Pain is most likely related to the chest wall pain, given significant tenderness to palpation.  Will  treat as such.  Workup today is very reassuring.  The patient's vital signs, pertinent lab work and imaging were reviewed and interpreted as discussed in the ED course. Hospitalization was considered for further testing, treatments, or serial exams/observation. However as patient is well-appearing, has a stable exam, and reassuring studies today, I do not feel that they warrant admission at this time. This plan was discussed with the patient who verbalizes agreement and comfort with this plan and seems reliable and able to return to the Emergency Department with worsening or changing symptoms.       Final diagnoses:  Chest pain, unspecified type  Chest wall tenderness    ED Discharge Orders          Ordered    lidocaine  (LIDODERM ) 5 %  Every 24 hours        11/27/23 1536    meloxicam  (MOBIC ) 15 MG tablet  Daily        11/27/23 1536               Desiderio Chew, PA-C 11/27/23 1540    Elnor Hila P, DO 12/05/23 2332

## 2023-11-27 NOTE — ED Triage Notes (Signed)
 Pt POV reports mid chest palpitations, ShOB since yesterday. Reports swelling in abd.   + nausea

## 2023-12-01 ENCOUNTER — Other Ambulatory Visit: Payer: Self-pay | Admitting: Neurology

## 2023-12-01 DIAGNOSIS — M545 Low back pain, unspecified: Secondary | ICD-10-CM

## 2023-12-01 MED ORDER — OXYCODONE-ACETAMINOPHEN 10-325 MG PO TABS: 1.0000 | ORAL_TABLET | Freq: Four times a day (QID) | ORAL | 0 refills | Status: DC | PRN

## 2023-12-01 NOTE — Telephone Encounter (Signed)
 Patient request refill for oxyCODONE -acetaminophen  (PERCOCET) 10-325 MG tablet send to [facility] [phone]

## 2023-12-01 NOTE — Telephone Encounter (Signed)
 Last seen on 07/02/23 Follow up scheduled on 02/05/24

## 2023-12-11 ENCOUNTER — Other Ambulatory Visit: Payer: Self-pay | Admitting: Family Medicine

## 2023-12-23 ENCOUNTER — Telehealth: Payer: Self-pay | Admitting: Family Medicine

## 2023-12-23 NOTE — Telephone Encounter (Signed)
 Copied from CRM #[id number]. Topic: Referral - Question >> Dec 23, 2023  2:52 PM [place]ique E wrote: Reason for CRM: Patient's son, Selinda, called in stating that the patient would prefer to be seen in [facility] for her endocrinology referral instead of [place]. Selinda stated it is okay to call his mom at [phone] with updated location, if doable.

## 2023-12-24 ENCOUNTER — Other Ambulatory Visit: Payer: Self-pay | Admitting: Family Medicine

## 2023-12-30 ENCOUNTER — Other Ambulatory Visit: Payer: Self-pay | Admitting: Neurology

## 2023-12-30 DIAGNOSIS — M545 Low back pain, unspecified: Secondary | ICD-10-CM

## 2023-12-30 MED ORDER — OXYCODONE-ACETAMINOPHEN 10-325 MG PO TABS: 1.0000 | ORAL_TABLET | Freq: Four times a day (QID) | ORAL | 0 refills | Status: DC | PRN

## 2023-12-30 NOTE — Telephone Encounter (Signed)
 Last seen on 07/02/23 Follow up scheduled on 02/05/24   Dispensed Days Supply Quantity Provider Pharmacy  OXYCOD/ACETAM 10-325MG  TAB 12/01/2023 30 120 each Sater, Charlie LABOR, MD [facility] [phone]   Rx pending to be signed

## 2023-12-30 NOTE — Telephone Encounter (Signed)
 Pt called to get medication refill   oxyCODONE -acetaminophen  (PERCOCET) 10-325 MG table   Pt medication  is to be sent to    [id number] - [place], [place] - [address] (Ph: [phone])

## 2024-01-04 ENCOUNTER — Other Ambulatory Visit: Payer: Self-pay | Admitting: Family Medicine

## 2024-01-20 ENCOUNTER — Other Ambulatory Visit: Payer: Self-pay | Admitting: Family Medicine

## 2024-01-21 DIAGNOSIS — L308 Other specified dermatitis: Secondary | ICD-10-CM | POA: Diagnosis not present

## 2024-01-21 DIAGNOSIS — L918 Other hypertrophic disorders of the skin: Secondary | ICD-10-CM | POA: Diagnosis not present

## 2024-01-26 ENCOUNTER — Other Ambulatory Visit: Payer: Self-pay | Admitting: Neurology

## 2024-01-26 DIAGNOSIS — M545 Low back pain, unspecified: Secondary | ICD-10-CM

## 2024-01-26 MED ORDER — OXYCODONE-ACETAMINOPHEN 10-325 MG PO TABS: 1.0000 | ORAL_TABLET | Freq: Four times a day (QID) | ORAL | 0 refills | Status: DC | PRN

## 2024-01-26 NOTE — Telephone Encounter (Signed)
 Dr.Camara you are work in provider Last seen on 02/27/23  Follow up scheduled 02/05/24   Dispensed Days Supply Quantity Provider Pharmacy  OXYCOD/ACETAM 10-325MG  TAB 12/30/2023 30 120 each Sater, Charlie LABOR, MD [facility] [phone] .SABRA     Rx pending to be signed

## 2024-01-26 NOTE — Telephone Encounter (Signed)
 Pt son called to request medication refill  oxyCODONE -acetaminophen  (PERCOCET) 10-325 MG tablet   Pt medication is to be sent to    [id number] - [place], [place] - [address] (Ph: [phone])

## 2024-02-05 ENCOUNTER — Encounter: Payer: Self-pay | Admitting: Neurology

## 2024-02-05 ENCOUNTER — Ambulatory Visit: Admitting: Neurology

## 2024-02-05 VITALS — BP 124/85 | HR 76 | Ht 65.0 in | Wt 229.5 lb

## 2024-02-05 DIAGNOSIS — G44229 Chronic tension-type headache, not intractable: Secondary | ICD-10-CM

## 2024-02-05 DIAGNOSIS — Z79891 Long term (current) use of opiate analgesic: Secondary | ICD-10-CM | POA: Diagnosis not present

## 2024-02-05 DIAGNOSIS — M5431 Sciatica, right side: Secondary | ICD-10-CM | POA: Diagnosis not present

## 2024-02-05 DIAGNOSIS — M7061 Trochanteric bursitis, right hip: Secondary | ICD-10-CM | POA: Diagnosis not present

## 2024-02-05 DIAGNOSIS — M7551 Bursitis of right shoulder: Secondary | ICD-10-CM

## 2024-02-05 DIAGNOSIS — M7552 Bursitis of left shoulder: Secondary | ICD-10-CM

## 2024-02-05 DIAGNOSIS — M7062 Trochanteric bursitis, left hip: Secondary | ICD-10-CM

## 2024-02-05 DIAGNOSIS — M5432 Sciatica, left side: Secondary | ICD-10-CM

## 2024-02-05 MED ORDER — MELOXICAM 15 MG PO TABS: 15.0000 mg | ORAL_TABLET | Freq: Every day | ORAL | 0 refills | Status: AC

## 2024-02-05 MED ORDER — FLUCONAZOLE 150 MG PO TABS: 150.0000 mg | ORAL_TABLET | Freq: Every day | ORAL | 5 refills | Status: AC

## 2024-02-05 MED ORDER — PROMETHAZINE HCL 12.5 MG PO TABS: ORAL_TABLET | ORAL | 1 refills | Status: AC

## 2024-02-05 MED ORDER — DEXAMETHASONE SODIUM PHOSPHATE 4 MG/ML IJ SOLN
8.0000 mg | Freq: Once | INTRAMUSCULAR | Status: AC
  Administered 2024-02-05: 8 mg
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
## 2024-02-08 LAB — DRUG SCREEN, UR (12+OXYCODONE+CRT)
Amphetamine Scrn, Ur: NEGATIVE ng/mL
BARBITURATE SCREEN URINE: NEGATIVE ng/mL
BENZODIAZEPINE SCREEN, URINE: NEGATIVE ng/mL
CANNABINOIDS UR QL SCN: NEGATIVE ng/mL
Cocaine (Metab) Scrn, Ur: NEGATIVE ng/mL
Creatinine(Crt), U: 137.8 mg/dL (ref 20.0–300.0)
Fentanyl, Urine: NEGATIVE pg/mL
Meperidine Screen, Urine: NEGATIVE ng/mL
Methadone Screen, Urine: NEGATIVE ng/mL
OXYCODONE+OXYMORPHONE UR QL SCN: POSITIVE — AB
Opiate Scrn, Ur: NEGATIVE ng/mL
Ph of Urine: 5.2 (ref 4.5–8.9)
Phencyclidine Qn, Ur: NEGATIVE ng/mL
Propoxyphene Scrn, Ur: NEGATIVE ng/mL
SPECIFIC GRAVITY: 1.019
Tramadol Screen, Urine: NEGATIVE ng/mL

## 2024-02-09 ENCOUNTER — Ambulatory Visit: Admitting: Family Medicine

## 2024-02-09 ENCOUNTER — Encounter: Payer: Self-pay | Admitting: Family Medicine

## 2024-02-09 VITALS — BP 132/84 | HR 91 | Temp 98.0°F | Resp 16 | Ht 65.0 in | Wt 225.0 lb

## 2024-02-09 DIAGNOSIS — I10 Essential (primary) hypertension: Secondary | ICD-10-CM

## 2024-02-09 DIAGNOSIS — E782 Mixed hyperlipidemia: Secondary | ICD-10-CM | POA: Diagnosis not present

## 2024-02-09 NOTE — Patient Instructions (Signed)
 Give us  2-3 business days to get the results of your labs back.   Keep the diet clean and stay active.  Let us  know if you need anything.

## 2024-02-09 NOTE — Progress Notes (Signed)
 Chief Complaint  Patient presents with   Follow-up    Follow Up    Subjective Kimberly Mann is a 69 y.o. female who presents for hypertension follow up. She does not routinely monitor home blood pressures. She is compliant with medications- Maxzide 37.5-25 mg/d. Patient has these side effects of medication: none She is adhering to a healthy diet overall. Current exercise: walking No CP or SOB.   Hyperlipidemia Patient presents for mixed hyperlipidemia follow up. Currently being treated with Zetia  10 mg/d, Tricor  48 mg/d and compliance with treatment thus far has been good. She denies myalgias. Diet/exercise as above. The patient is not known to have coexisting coronary artery disease.   Past Medical History:  Diagnosis Date   Anxiety    Cancer (HCC)    breast   Chronic back pain    takes oxycodone    Complication of anesthesia    Eczema    Environmental allergies    Headache    Hypertension    PONV (postoperative nausea and vomiting)    Vertigo     Exam BP 132/84 (BP Location: Left Arm, Patient Position: Sitting)   Pulse 91   Temp 98 F (36.7 C) (Oral)   Resp 16   Ht 5' 5 (1.651 m)   Wt 225 lb (102.1 kg)   SpO2 97%   BMI 37.44 kg/m  General:  well developed, well nourished, in no apparent distress Heart: RRR, no bruits, no LE edema Lungs: clear to auscultation, no accessory muscle use Psych: well oriented with normal range of affect and appropriate judgment/insight  Essential hypertension  Mixed hyperlipidemia - Plan: Comprehensive metabolic panel with GFR, Lipid panel  Chronic, stable. Cont Maxzide 37.5-25 mg/d. Counseled on diet and exercise. Chronic, stable. Cont Zetia  10 mg/d, Tricor  48 mg/d. F/u in 6 mo. The patient voiced understanding and agreement to the plan.  Mabel [place], DO 02/09/24  2:22 PM

## 2024-02-10 DIAGNOSIS — M85852 Other specified disorders of bone density and structure, left thigh: Secondary | ICD-10-CM | POA: Diagnosis not present

## 2024-02-10 LAB — COMPREHENSIVE METABOLIC PANEL WITH GFR
ALT: 22 U/L (ref 0–35)
AST: 20 U/L (ref 0–37)
Albumin: 4.8 g/dL (ref 3.5–5.2)
Alkaline Phosphatase: 63 U/L (ref 39–117)
BUN: 21 mg/dL (ref 6–23)
CO2: 27 meq/L (ref 19–32)
Calcium: 9.9 mg/dL (ref 8.4–10.5)
Chloride: 100 meq/L (ref 96–112)
Creatinine, Ser: 1.06 mg/dL (ref 0.40–1.20)
GFR: 53.57 mL/min — ABNORMAL LOW (ref >60.00)
Glucose, Bld: 109 mg/dL — ABNORMAL HIGH (ref 70–99)
Potassium: 3.5 meq/L (ref 3.5–5.1)
Sodium: 138 meq/L (ref 135–145)
Total Bilirubin: 0.4 mg/dL (ref 0.2–1.2)
Total Protein: 7.8 g/dL (ref 6.0–8.3)

## 2024-02-10 LAB — LIPID PANEL
Cholesterol: 224 mg/dL — ABNORMAL HIGH (ref 0–200)
HDL: 60.5 mg/dL (ref >39.00)
LDL Cholesterol: 138 mg/dL — ABNORMAL HIGH (ref 0–99)
NonHDL: 163.4
Total CHOL/HDL Ratio: 4
Triglycerides: 125 mg/dL (ref 0.0–149.0)
VLDL: 25 mg/dL (ref 0.0–40.0)

## 2024-02-11 ENCOUNTER — Ambulatory Visit: Payer: Self-pay | Admitting: Family Medicine

## 2024-02-11 ENCOUNTER — Other Ambulatory Visit: Payer: Self-pay

## 2024-02-11 DIAGNOSIS — E559 Vitamin D deficiency, unspecified: Secondary | ICD-10-CM

## 2024-02-11 DIAGNOSIS — N289 Disorder of kidney and ureter, unspecified: Secondary | ICD-10-CM

## 2024-02-11 DIAGNOSIS — E782 Mixed hyperlipidemia: Secondary | ICD-10-CM

## 2024-02-12 ENCOUNTER — Other Ambulatory Visit: Payer: Self-pay

## 2024-02-12 DIAGNOSIS — Z1211 Encounter for screening for malignant neoplasm of colon: Secondary | ICD-10-CM

## 2024-02-17 DIAGNOSIS — M25562 Pain in left knee: Secondary | ICD-10-CM | POA: Diagnosis not present

## 2024-02-17 DIAGNOSIS — M170 Bilateral primary osteoarthritis of knee: Secondary | ICD-10-CM | POA: Diagnosis not present

## 2024-02-17 DIAGNOSIS — M25561 Pain in right knee: Secondary | ICD-10-CM | POA: Diagnosis not present

## 2024-02-17 DIAGNOSIS — G8929 Other chronic pain: Secondary | ICD-10-CM | POA: Diagnosis not present

## 2024-02-19 DIAGNOSIS — Z1211 Encounter for screening for malignant neoplasm of colon: Secondary | ICD-10-CM | POA: Diagnosis not present

## 2024-02-21 ENCOUNTER — Other Ambulatory Visit: Payer: Self-pay | Admitting: Family Medicine

## 2024-02-25 ENCOUNTER — Ambulatory Visit: Admitting: Family Medicine

## 2024-02-26 ENCOUNTER — Other Ambulatory Visit: Payer: Self-pay | Admitting: Neurology

## 2024-02-26 DIAGNOSIS — M545 Low back pain, unspecified: Secondary | ICD-10-CM

## 2024-02-26 MED ORDER — OXYCODONE-ACETAMINOPHEN 10-325 MG PO TABS: 1.0000 | ORAL_TABLET | Freq: Four times a day (QID) | ORAL | 0 refills | Status: DC | PRN

## 2024-02-26 NOTE — Telephone Encounter (Signed)
 Last seen 02/05/24 Next appt 09/16/24 Dispenses   Dispensed Days Supply Quantity Provider Pharmacy  OXYCOD/ACETAM 10-325MG  TAB 01/29/2024 30 120 each Gregg Lek, MD [facility] [phone] ...  OXYCOD/ACETAM 10-325MG  TAB 12/30/2023 30 120 each Sater, Charlie LABOR, MD [facility] [phone] ...  OXYCOD/ACETAM 10-325MG  TAB 12/01/2023 30 120 each Sater, Charlie LABOR, MD [facility] [phone] ...  OXYCOD/ACETAM 10-325MG  TAB 11/01/2023 30 120 each Sater, Charlie LABOR, MD [facility] [phone] ...  OXYCOD/ACETAM 10-325MG  TAB 10/03/2023 30 120 each Sater, Charlie LABOR, MD [facility] [phone] ...  OXYCOD/ACETAM 10-325MG  TAB 09/02/2023 30 120 each Sater, Charlie LABOR, MD [facility] [phone] ...  OXYCOD/ACETAM 10-325MG  TAB 08/04/2023 30 120 each Sater, Charlie LABOR, MD [facility] [phone] ...  OXYCOD/ACETAM 10-325MG  TAB 07/05/2023 30 120 each Sater, Charlie LABOR, MD [facility] [phone] ...  OXYCOD/ACETAM 10-325MG  TAB 06/05/2023 30 120 each Sater, Charlie LABOR, MD [facility] [phone] ...  OXYCOD/ACETAM 10-325MG  TAB 05/08/2023 30 120 each Sater, Charlie LABOR, MD [facility] [phone] ...  OXYCOD/ACETAM 10-325MG  TAB 04/10/2023 30 120 each Sater, Charlie LABOR, MD [facility] [phone] ...  OXYCOD/ACETAM 10-325MG  TAB 03/13/2023 30 120 each Sater, Charlie LABOR, MD [facility] [phone] .SABRASABRA

## 2024-02-26 NOTE — Telephone Encounter (Signed)
 Pt is needing a refill on her oxyCODONE -acetaminophen  (PERCOCET) 10-325 MG tablet and is needing it sent to the [facility] on [address]

## 2024-03-03 ENCOUNTER — Ambulatory Visit: Payer: Self-pay | Admitting: Family Medicine

## 2024-03-03 LAB — COLOGUARD: COLOGUARD: NEGATIVE

## 2024-03-22 ENCOUNTER — Other Ambulatory Visit (INDEPENDENT_AMBULATORY_CARE_PROVIDER_SITE_OTHER)

## 2024-03-22 ENCOUNTER — Ambulatory Visit: Payer: Self-pay | Admitting: Family Medicine

## 2024-03-22 DIAGNOSIS — E782 Mixed hyperlipidemia: Secondary | ICD-10-CM

## 2024-03-22 DIAGNOSIS — E559 Vitamin D deficiency, unspecified: Secondary | ICD-10-CM

## 2024-03-22 DIAGNOSIS — N289 Disorder of kidney and ureter, unspecified: Secondary | ICD-10-CM

## 2024-03-22 LAB — BASIC METABOLIC PANEL WITH GFR
BUN: 21 mg/dL (ref 6–23)
CO2: 28 meq/L (ref 19–32)
Calcium: 9.7 mg/dL (ref 8.4–10.5)
Chloride: 105 meq/L (ref 96–112)
Creatinine, Ser: 1.08 mg/dL (ref 0.40–1.20)
GFR: 52.34 mL/min — ABNORMAL LOW (ref >60.00)
Glucose, Bld: 91 mg/dL (ref 70–99)
Potassium: 4 meq/L (ref 3.5–5.1)
Sodium: 142 meq/L (ref 135–145)

## 2024-03-22 LAB — HEMOGLOBIN A1C: Hgb A1c MFr Bld: 5.8 % (ref 4.6–6.5)

## 2024-03-22 LAB — VITAMIN D 25 HYDROXY (VIT D DEFICIENCY, FRACTURES): VITD: 51.37 ng/mL (ref 30.00–100.00)

## 2024-03-22 NOTE — Addendum Note (Signed)
 Addended by: ROSEBUD NEST A on: 03/22/2024 10:11 AM   Modules accepted: Orders

## 2024-03-24 ENCOUNTER — Other Ambulatory Visit: Payer: Self-pay | Admitting: Neurology

## 2024-03-24 ENCOUNTER — Other Ambulatory Visit: Payer: Self-pay | Admitting: Family Medicine

## 2024-03-24 DIAGNOSIS — M545 Low back pain, unspecified: Secondary | ICD-10-CM

## 2024-03-24 MED ORDER — OXYCODONE-ACETAMINOPHEN 10-325 MG PO TABS: 1.0000 | ORAL_TABLET | Freq: Four times a day (QID) | ORAL | 0 refills | Status: DC | PRN

## 2024-03-24 NOTE — Telephone Encounter (Signed)
 Requested Prescriptions   Pending Prescriptions Disp Refills   oxyCODONE -acetaminophen  (PERCOCET) 10-325 MG tablet 120 tablet 0    Sig: Take 1 tablet by mouth every 6 (six) hours as needed for pain.   LAST SEEN 02/05/24 NEXT APPT 09/16/24 Dispenses   Dispensed Days Supply Quantity Provider Pharmacy  OXYCOD/ACETAM 10-325MG  TAB 03/01/2024 30 120 each Sater, Charlie LABOR, MD [facility] [phone] ...  OXYCOD/ACETAM 10-325MG  TAB 01/29/2024 30 120 each Gregg Lek, MD [facility] [phone] ...  OXYCOD/ACETAM 10-325MG  TAB 12/30/2023 30 120 each Sater, Charlie LABOR, MD [facility] [phone] ...  OXYCOD/ACETAM 10-325MG  TAB 12/01/2023 30 120 each Sater, Charlie LABOR, MD [facility] [phone] ...  OXYCOD/ACETAM 10-325MG  TAB 11/01/2023 30 120 each Sater, Charlie LABOR, MD [facility] [phone] ...  OXYCOD/ACETAM 10-325MG  TAB 10/03/2023 30 120 each Sater, Charlie LABOR, MD [facility] [phone] ...  OXYCOD/ACETAM 10-325MG  TAB 09/02/2023 30 120 each Sater, Charlie LABOR, MD [facility] [phone] ...  OXYCOD/ACETAM 10-325MG  TAB 08/04/2023 30 120 each Sater, Charlie LABOR, MD [facility] [phone] ...  OXYCOD/ACETAM 10-325MG  TAB 07/05/2023 30 120 each Sater, Charlie LABOR, MD [facility] [phone] ...  OXYCOD/ACETAM 10-325MG  TAB 06/05/2023 30 120 each Sater, Charlie LABOR, MD [facility] [phone] ...  OXYCOD/ACETAM 10-325MG  TAB 05/08/2023 30 120 each Sater, Charlie LABOR, MD [facility] [phone] ...  OXYCOD/ACETAM 10-325MG  TAB 04/10/2023 30 120 each Sater, Charlie LABOR, MD [facility] [phone] .SABRASABRA

## 2024-03-24 NOTE — Telephone Encounter (Signed)
Pt is requesting a refill for oxyCODONE-acetaminophen (PERCOCET) 10-325 MG tablet .  Pharmacy: [id number]   

## 2024-04-02 ENCOUNTER — Ambulatory Visit: Admitting: "Endocrinology

## 2024-04-23 ENCOUNTER — Other Ambulatory Visit: Payer: Self-pay | Admitting: Family Medicine

## 2024-04-26 NOTE — Telephone Encounter (Signed)
 Pt called to request medication Refill oxyCODONE -acetaminophen  (PERCOCET) 10-325 MG tablet   Pt medication is to be sent to   [id number] - [place] - [address] (Ph: [phone])

## 2024-04-28 MED ORDER — OXYCODONE-ACETAMINOPHEN 10-325 MG PO TABS: 1.0000 | ORAL_TABLET | Freq: Four times a day (QID) | ORAL | 0 refills | Status: DC | PRN

## 2024-04-28 NOTE — Telephone Encounter (Signed)
 Requested Prescriptions   Pending Prescriptions Disp Refills   oxyCODONE -acetaminophen  (PERCOCET) 10-325 MG tablet 120 tablet 0    Sig: Take 1 tablet by mouth every 6 (six) hours as needed for pain.   Signed Prescriptions Disp Refills   oxyCODONE -acetaminophen  (PERCOCET) 10-325 MG tablet 120 tablet 0    Sig: Take 1 tablet by mouth every 6 (six) hours as needed for pain.    Authorizing Provider: VEAR ADE A   Last seen 02/05/24 Next appt  09/16/24  Dispenses   Dispensed Days Supply Quantity Provider Pharmacy  OXYCOD/ACETAM 10-325MG  TAB 03/30/2024 30 120 each Sater, Ade LABOR, MD [facility] [phone] ...  OXYCOD/ACETAM 10-325MG  TAB 03/01/2024 30 120 each Sater, Ade LABOR, MD [facility] [phone] ...  OXYCOD/ACETAM 10-325MG  TAB 01/29/2024 30 120 each Gregg Lek, MD [facility] [phone] ...  OXYCOD/ACETAM 10-325MG  TAB 12/30/2023 30 120 each Sater, Ade LABOR, MD [facility] [phone] ...  OXYCOD/ACETAM 10-325MG  TAB 12/01/2023 30 120 each Sater, Ade LABOR, MD [facility] [phone] ...  OXYCOD/ACETAM 10-325MG  TAB 11/01/2023 30 120 each Sater, Ade LABOR, MD [facility] [phone] ...  OXYCOD/ACETAM 10-325MG  TAB 10/03/2023 30 120 each Sater, Ade LABOR, MD [facility] [phone] ...  OXYCOD/ACETAM 10-325MG  TAB 09/02/2023 30 120 each Sater, Ade LABOR, MD [facility] [phone] ...  OXYCOD/ACETAM 10-325MG  TAB 08/04/2023 30 120 each Sater, Ade LABOR, MD [facility] [phone] ...  OXYCOD/ACETAM 10-325MG  TAB 07/05/2023 30 120 each Sater, Ade LABOR, MD [facility] [phone] ...  OXYCOD/ACETAM 10-325MG  TAB 06/05/2023 30 120 each Sater, Ade LABOR, MD [facility] [phone] ...  OXYCOD/ACETAM 10-325MG  TAB 05/08/2023 30 120 each Sater, Ade LABOR, MD [facility] [phone] .SABRASABRA

## 2024-04-28 NOTE — Telephone Encounter (Signed)
 Pt's son has called to report he has checked with the pharmacy, the medication refill has not been called in yet, pt will be out on 04/30/24

## 2024-04-28 NOTE — Addendum Note (Signed)
 Addended by: ONEITA HOIST E on: 04/28/2024 11:41 AM   Modules accepted: Orders

## 2024-05-25 ENCOUNTER — Other Ambulatory Visit: Payer: Self-pay | Admitting: Family Medicine

## 2024-05-26 ENCOUNTER — Other Ambulatory Visit: Payer: Self-pay | Admitting: Neurology

## 2024-05-26 DIAGNOSIS — M545 Low back pain, unspecified: Secondary | ICD-10-CM

## 2024-05-26 MED ORDER — OXYCODONE-ACETAMINOPHEN 10-325 MG PO TABS: 1.0000 | ORAL_TABLET | Freq: Four times a day (QID) | ORAL | 0 refills | Status: AC | PRN

## 2024-05-26 NOTE — Telephone Encounter (Signed)
 Last seen 02/05/24 Next appt 09/16/24  Dispenses   Dispensed Days Supply Quantity Provider Pharmacy  OXYCOD/ACETAM 10-325MG  TAB 04/28/2024 30 120 each Sater, Charlie LABOR, MD [facility] [phone] ...  OXYCOD/ACETAM 10-325MG  TAB 03/30/2024 30 120 each Sater, Charlie LABOR, MD [facility] [phone] ...  OXYCOD/ACETAM 10-325MG  TAB 03/01/2024 30 120 each Sater, Charlie LABOR, MD [facility] [phone] ...  OXYCOD/ACETAM 10-325MG  TAB 01/29/2024 30 120 each Gregg Lek, MD [facility] [phone] ...  OXYCOD/ACETAM 10-325MG  TAB 12/30/2023 30 120 each Sater, Charlie LABOR, MD [facility] [phone] ...  OXYCOD/ACETAM 10-325MG  TAB 12/01/2023 30 120 each Sater, Charlie LABOR, MD [facility] [phone] ...  OXYCOD/ACETAM 10-325MG  TAB 11/01/2023 30 120 each Sater, Charlie LABOR, MD [facility] [phone] ...  OXYCOD/ACETAM 10-325MG  TAB 10/03/2023 30 120 each Sater, Charlie LABOR, MD [facility] [phone] ...  OXYCOD/ACETAM 10-325MG  TAB 09/02/2023 30 120 each Sater, Charlie LABOR, MD [facility] [phone] ...  OXYCOD/ACETAM 10-325MG  TAB 08/04/2023 30 120 each Sater, Charlie LABOR, MD [facility] [phone] ...  OXYCOD/ACETAM 10-325MG  TAB 07/05/2023 30 120 each Sater, Charlie LABOR, MD [facility] [phone] ...  OXYCOD/ACETAM 10-325MG  TAB 06/05/2023 30 120 each Sater, Charlie LABOR, MD [facility] [phone] .SABRASABRA

## 2024-05-26 NOTE — Telephone Encounter (Signed)
 Pt son called to request medication refill oxyCODONE -acetaminophen  (PERCOCET) 10-325 MG tablet   Medication is to be sent to  [id number] - [place], [place] - [address] Phone: [phone]  Fax: [phone]

## 2024-08-25 ENCOUNTER — Encounter: Admitting: Family Medicine

## 2024-09-16 ENCOUNTER — Ambulatory Visit: Admitting: Neurology

## 2024-10-19 ENCOUNTER — Ambulatory Visit
